# Patient Record
Sex: Male | Born: 1940 | Race: White | Hispanic: No | Marital: Married | Smoking: Never smoker
Health system: Southern US, Community
[De-identification: ages and names within clinical notes are randomized; demographics above are authoritative.]

## PROBLEM LIST (undated history)

## (undated) DIAGNOSIS — N200 Calculus of kidney: Secondary | ICD-10-CM

## (undated) DIAGNOSIS — I6529 Occlusion and stenosis of unspecified carotid artery: Secondary | ICD-10-CM

## (undated) DIAGNOSIS — R4189 Other symptoms and signs involving cognitive functions and awareness: Secondary | ICD-10-CM

## (undated) DIAGNOSIS — M1A00X0 Idiopathic chronic gout, unspecified site, without tophus (tophi): Secondary | ICD-10-CM

## (undated) DIAGNOSIS — E7800 Pure hypercholesterolemia, unspecified: Secondary | ICD-10-CM

## (undated) DIAGNOSIS — K602 Anal fissure, unspecified: Secondary | ICD-10-CM

## (undated) DIAGNOSIS — I219 Acute myocardial infarction, unspecified: Secondary | ICD-10-CM

## (undated) DIAGNOSIS — R609 Edema, unspecified: Secondary | ICD-10-CM

## (undated) DIAGNOSIS — E538 Deficiency of other specified B group vitamins: Secondary | ICD-10-CM

## (undated) DIAGNOSIS — N400 Enlarged prostate without lower urinary tract symptoms: Secondary | ICD-10-CM

## (undated) DIAGNOSIS — I25119 Atherosclerotic heart disease of native coronary artery with unspecified angina pectoris: Principal | ICD-10-CM

## (undated) DIAGNOSIS — R5383 Other fatigue: Secondary | ICD-10-CM

## (undated) DIAGNOSIS — Z87442 Personal history of urinary calculi: Secondary | ICD-10-CM

## (undated) DIAGNOSIS — R7303 Prediabetes: Secondary | ICD-10-CM

## (undated) DIAGNOSIS — R5381 Other malaise: Secondary | ICD-10-CM

## (undated) DIAGNOSIS — E669 Obesity, unspecified: Secondary | ICD-10-CM

## (undated) DIAGNOSIS — N183 Chronic kidney disease, stage 3 (moderate): Secondary | ICD-10-CM

## (undated) DIAGNOSIS — F330 Major depressive disorder, recurrent, mild: Secondary | ICD-10-CM

## (undated) DIAGNOSIS — Z125 Encounter for screening for malignant neoplasm of prostate: Secondary | ICD-10-CM

## (undated) DIAGNOSIS — E785 Hyperlipidemia, unspecified: Secondary | ICD-10-CM

## (undated) DIAGNOSIS — H409 Unspecified glaucoma: Secondary | ICD-10-CM

## (undated) DIAGNOSIS — F419 Anxiety disorder, unspecified: Secondary | ICD-10-CM

## (undated) DIAGNOSIS — I10 Essential (primary) hypertension: Secondary | ICD-10-CM

## (undated) DIAGNOSIS — E213 Hyperparathyroidism, unspecified: Secondary | ICD-10-CM

## (undated) DIAGNOSIS — K635 Polyp of colon: Secondary | ICD-10-CM

## (undated) DIAGNOSIS — Z79899 Other long term (current) drug therapy: Secondary | ICD-10-CM

## (undated) DIAGNOSIS — M1990 Unspecified osteoarthritis, unspecified site: Secondary | ICD-10-CM

## (undated) HISTORY — DX: Essential (primary) hypertension: I10

## (undated) HISTORY — DX: Calculus of kidney: N20.0

## (undated) HISTORY — DX: Unspecified glaucoma: H40.9

## (undated) HISTORY — PX: CATARACT EXTRACTION, BILATERAL: SHX1313

## (undated) HISTORY — PX: ANAL FISTULOTOMY: SHX6423

## (undated) HISTORY — DX: Pure hypercholesterolemia, unspecified: E78.00

## (undated) HISTORY — DX: Hyperlipidemia, unspecified: E78.5

## (undated) HISTORY — DX: Idiopathic chronic gout, unspecified site, without tophus (tophi): M1A.00X0

## (undated) HISTORY — DX: Anal fissure, unspecified: K60.2

## (undated) HISTORY — DX: Occlusion and stenosis of unspecified carotid artery: I65.29

## (undated) HISTORY — DX: Other malaise: R53.81

## (undated) HISTORY — DX: Other symptoms and signs involving cognitive functions and awareness: R41.89

## (undated) HISTORY — DX: Major depressive disorder, recurrent, mild: F33.0

## (undated) HISTORY — DX: Encounter for screening for malignant neoplasm of prostate: Z12.5

## (undated) HISTORY — DX: Benign prostatic hyperplasia without lower urinary tract symptoms: N40.0

## (undated) HISTORY — DX: Anxiety disorder, unspecified: F41.9

## (undated) HISTORY — DX: Polyp of colon: K63.5

## (undated) HISTORY — DX: Other fatigue: R53.83

## (undated) HISTORY — DX: Atherosclerotic heart disease of native coronary artery with unspecified angina pectoris: I25.119

## (undated) HISTORY — DX: Prediabetes: R73.03

## (undated) HISTORY — DX: Hyperparathyroidism, unspecified: E21.3

## (undated) HISTORY — DX: Other long term (current) drug therapy: Z79.899

## (undated) HISTORY — DX: Chronic kidney disease, stage 3 (moderate): N18.3

## (undated) HISTORY — PX: HEMORRHOID SURGERY: SHX153

## (undated) HISTORY — PX: CORONARY ANGIOPLASTY: SHX604

## (undated) HISTORY — DX: Deficiency of other specified B group vitamins: E53.8

## (undated) HISTORY — PX: HEMORROIDECTOMY: SUR656

## (undated) HISTORY — DX: Edema, unspecified: R60.9

## (undated) HISTORY — PX: CARDIAC CATHETERIZATION: SHX172

## (undated) HISTORY — PX: TONSILLECTOMY: SUR1361

## (undated) HISTORY — DX: Unspecified osteoarthritis, unspecified site: M19.90

## (undated) HISTORY — DX: Obesity, unspecified: E66.9

---

## 2000-11-02 ENCOUNTER — Inpatient Hospital Stay (HOSPITAL_COMMUNITY): Admission: EM | Admit: 2000-11-02 | Discharge: 2000-11-03 | Payer: Self-pay | Admitting: Emergency Medicine

## 2000-11-02 ENCOUNTER — Encounter: Payer: Self-pay | Admitting: Emergency Medicine

## 2000-11-03 ENCOUNTER — Encounter: Payer: Self-pay | Admitting: Cardiology

## 2001-01-05 ENCOUNTER — Ambulatory Visit (HOSPITAL_COMMUNITY): Admission: RE | Admit: 2001-01-05 | Discharge: 2001-01-05 | Payer: Self-pay | Admitting: Cardiology

## 2004-11-05 ENCOUNTER — Ambulatory Visit (HOSPITAL_COMMUNITY): Admission: RE | Admit: 2004-11-05 | Discharge: 2004-11-05 | Payer: Self-pay | Admitting: Urology

## 2004-12-13 ENCOUNTER — Ambulatory Visit (HOSPITAL_COMMUNITY): Admission: RE | Admit: 2004-12-13 | Discharge: 2004-12-13 | Payer: Self-pay | Admitting: Urology

## 2005-03-21 ENCOUNTER — Ambulatory Visit (HOSPITAL_BASED_OUTPATIENT_CLINIC_OR_DEPARTMENT_OTHER): Admission: RE | Admit: 2005-03-21 | Discharge: 2005-03-21 | Payer: Self-pay | Admitting: Urology

## 2012-06-08 HISTORY — PX: COLONOSCOPY: SHX5424

## 2015-04-28 DIAGNOSIS — N529 Male erectile dysfunction, unspecified: Secondary | ICD-10-CM

## 2015-04-28 DIAGNOSIS — E213 Hyperparathyroidism, unspecified: Secondary | ICD-10-CM | POA: Insufficient documentation

## 2015-04-28 DIAGNOSIS — Z79899 Other long term (current) drug therapy: Secondary | ICD-10-CM | POA: Insufficient documentation

## 2015-04-28 DIAGNOSIS — R609 Edema, unspecified: Secondary | ICD-10-CM

## 2015-04-28 DIAGNOSIS — N400 Enlarged prostate without lower urinary tract symptoms: Secondary | ICD-10-CM

## 2015-04-28 DIAGNOSIS — E785 Hyperlipidemia, unspecified: Secondary | ICD-10-CM

## 2015-04-28 DIAGNOSIS — K635 Polyp of colon: Secondary | ICD-10-CM

## 2015-04-28 DIAGNOSIS — N183 Chronic kidney disease, stage 3 unspecified: Secondary | ICD-10-CM | POA: Insufficient documentation

## 2015-04-28 DIAGNOSIS — I10 Essential (primary) hypertension: Secondary | ICD-10-CM | POA: Insufficient documentation

## 2015-04-28 DIAGNOSIS — E669 Obesity, unspecified: Secondary | ICD-10-CM | POA: Insufficient documentation

## 2015-04-28 DIAGNOSIS — M1990 Unspecified osteoarthritis, unspecified site: Secondary | ICD-10-CM | POA: Insufficient documentation

## 2015-04-28 DIAGNOSIS — N1831 Chronic kidney disease, stage 3a: Secondary | ICD-10-CM

## 2015-04-28 DIAGNOSIS — M1A00X0 Idiopathic chronic gout, unspecified site, without tophus (tophi): Secondary | ICD-10-CM | POA: Insufficient documentation

## 2015-04-28 DIAGNOSIS — E538 Deficiency of other specified B group vitamins: Secondary | ICD-10-CM | POA: Insufficient documentation

## 2015-04-28 HISTORY — DX: Unspecified osteoarthritis, unspecified site: M19.90

## 2015-04-28 HISTORY — DX: Deficiency of other specified B group vitamins: E53.8

## 2015-04-28 HISTORY — DX: Hyperparathyroidism, unspecified: E21.3

## 2015-04-28 HISTORY — DX: Edema, unspecified: R60.9

## 2015-04-28 HISTORY — DX: Idiopathic chronic gout, unspecified site, without tophus (tophi): M1A.00X0

## 2015-04-28 HISTORY — DX: Polyp of colon: K63.5

## 2015-04-28 HISTORY — DX: Chronic kidney disease, stage 3a: N18.31

## 2015-04-28 HISTORY — DX: Chronic kidney disease, stage 3 unspecified: N18.30

## 2015-04-28 HISTORY — DX: Obesity, unspecified: E66.9

## 2015-04-28 HISTORY — DX: Hyperlipidemia, unspecified: E78.5

## 2015-04-28 HISTORY — DX: Benign prostatic hyperplasia without lower urinary tract symptoms: N40.0

## 2015-04-28 HISTORY — DX: Male erectile dysfunction, unspecified: N52.9

## 2015-04-28 HISTORY — DX: Other long term (current) drug therapy: Z79.899

## 2015-04-28 HISTORY — DX: Essential (primary) hypertension: I10

## 2016-04-29 DIAGNOSIS — F419 Anxiety disorder, unspecified: Secondary | ICD-10-CM | POA: Insufficient documentation

## 2016-04-29 HISTORY — DX: Anxiety disorder, unspecified: F41.9

## 2016-05-06 DIAGNOSIS — I25119 Atherosclerotic heart disease of native coronary artery with unspecified angina pectoris: Secondary | ICD-10-CM | POA: Insufficient documentation

## 2016-05-06 HISTORY — DX: Atherosclerotic heart disease of native coronary artery with unspecified angina pectoris: I25.119

## 2016-05-16 DIAGNOSIS — F330 Major depressive disorder, recurrent, mild: Secondary | ICD-10-CM | POA: Insufficient documentation

## 2016-05-16 HISTORY — DX: Major depressive disorder, recurrent, mild: F33.0

## 2016-06-04 DIAGNOSIS — R5383 Other fatigue: Secondary | ICD-10-CM

## 2016-06-04 DIAGNOSIS — R5381 Other malaise: Secondary | ICD-10-CM

## 2016-06-04 HISTORY — DX: Other malaise: R53.81

## 2016-06-05 DIAGNOSIS — R4189 Other symptoms and signs involving cognitive functions and awareness: Secondary | ICD-10-CM | POA: Insufficient documentation

## 2016-06-05 HISTORY — DX: Other symptoms and signs involving cognitive functions and awareness: R41.89

## 2017-03-07 DIAGNOSIS — R7303 Prediabetes: Secondary | ICD-10-CM

## 2017-03-07 HISTORY — DX: Prediabetes: R73.03

## 2017-09-08 DIAGNOSIS — Z125 Encounter for screening for malignant neoplasm of prostate: Secondary | ICD-10-CM

## 2017-09-08 HISTORY — DX: Encounter for screening for malignant neoplasm of prostate: Z12.5

## 2017-12-08 NOTE — Progress Notes (Signed)
Cardiology Office Note:    Date:  12/09/2017   ID:  Matthew Mcneil, DOB Dec 28, 1940, MRN [id number]  PCP:  Gordan Payment., MD  Cardiologist:  Norman Herrlich, MD    Referring MD: Gordan Payment., MD    ASSESSMENT:    1. Coronary artery disease involving native coronary artery of native heart with angina pectoris (HCC)   2. Essential hypertension   3. Hyperlipidemia, unspecified hyperlipidemia type   4. Chronic kidney disease, stage III (moderate) (HCC)    PLAN:    In order of problems listed above:  1. His coronary artery disease is stable chronic no anginal discomfort he reduce aspirin 81 mg daily continue beta-blocker calcium channel blocker for hypertension relief of angina and initiate PCSK9 therapy at this time I would not advise an ischemia evaluation 2. Stable continue treatment diuretic beta-blocker dosing channel blocker 3. Poorly controlled initiate PCSK9 4. 1 month we will check a lipid profile and CMP including renal function   Next appointment: 6 months   Medication Adjustments/Labs and Tests Ordered: Current medicines are reviewed at length with the patient today.  Concerns regarding medicines are outlined above.  No orders of the defined types were placed in this encounter.  No orders of the defined types were placed in this encounter.     Chief Complaint  Patient presents with  . Follow-up    for CAD    History of Present Illness:    Matthew Mcneil is a 77 y.o. male with a hx of CAD PCI in 1989 subsequent heart catheterization 2002 with chronic total occlusion treated medically other problems include hypertension hyperlipidemia, stage III CKD and bradycardia and a carotid bruit with right coronary artery stenosis.  This last seen by me 06/10/2016 at [facility] cardiology.. 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  He is very active has had no angina dyspnea  palpitation or syncope.  Said no episodes requiring nitroglycerin.  He is statin intolerant with muscle weakness and will be initiated on PCSK9 with familial hyperlipidemia LDL greater than 220.  He has known carotid disease and will have a repeat cerebrovascular duplex although he is not having TIA Past Medical History:  Diagnosis Date  . Anxiety 04/29/2016  . Benign prostate hyperplasia 04/28/2015  . Carotid artery occlusion   . 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 Chronic kidney disease, stage III (moderate) (HCC) 04/28/2015  . Colon polyp 04/28/2015  . Coronary artery disease involving native coronary artery of native heart with angina pectoris (HCC) 05/06/2016  . Edema 04/28/2015  . Essential hypertension 04/28/2015  . High risk medication use 04/28/2015  . Hyperlipidemia 04/28/2015  . Hyperparathyroidism (HCC) 04/28/2015  . Impaired cognition 06/05/2016  . Malaise and fatigue 06/04/2016  . 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 Obesity 04/28/2015  . Osteoarthritis 04/28/2015  . Prediabetes 03/07/2017  . Screening for prostate cancer 09/08/2017   Chooses no further testing  . Vitamin B12 deficiency 04/28/2015    Past Surgical History:  Procedure Laterality Date  . CARDIAC CATHETERIZATION    . CORONARY ANGIOPLASTY    . HEMORROIDECTOMY      Current Medications: Current Meds  Medication Sig  . allopurinol (ZYLOPRIM) 100 MG tablet Take 1 tablet by mouth 2 (two) times daily.  Marland Kitchen amLODipine (NORVASC) 5 MG tablet Take 1 tablet by mouth 2 (two) times daily.  Marland Kitchen aspirin 325 MG EC tablet  Take 325 mg by mouth daily.  . carvedilol (COREG) 3.125 MG tablet Take 3.125 mg by mouth daily.  . dorzolamidel-timolol (COSOPT) 22.3-6.8 MG/ML SOLN ophthalmic solution 1 drop 2 (two) times daily.  Marland Kitchen escitalopram (LEXAPRO) 10 MG tablet Take 10 mg by mouth daily.  . finasteride (PROSCAR) 5 MG tablet Take 5 mg by mouth daily.  . furosemide (LASIX) 40 MG tablet Take 40 mg by mouth  daily.  Marland Kitchen latanoprost (XALATAN) 0.005 % ophthalmic solution Place 1 drop into both eyes at bedtime.  . Omega-3 Fatty Acids (FISH OIL) 1000 MG CAPS Take 1 capsule by mouth 2 (two) times daily.  . vitamin B-12 (CYANOCOBALAMIN) 500 MCG tablet Take 500 mcg by mouth every other day.     Allergies:   Statins   Social History   Socioeconomic History  . Marital status: Married    Spouse name: Not on file  . Number of children: Not on file  . Years of education: Not on file  . Highest education level: Not on file  Occupational History  . Not on file  Social Needs  . Financial resource strain: Not on file  . Food insecurity:    Worry: Not on file    Inability: Not on file  . Transportation needs:    Medical: Not on file    Non-medical: Not on file  Tobacco Use  . Smoking status: Former Games developer  . Smokeless tobacco: Never Used  Substance and Sexual Activity  . Alcohol use: Not Currently    Frequency: Never  . Drug use: Not Currently  . Sexual activity: Not on file  Lifestyle  . Physical activity:    Days per week: Not on file    Minutes per session: Not on file  . Stress: Not on file  Relationships  . Social connections:    Talks on phone: Not on file    Gets together: Not on file    Attends religious service: Not on file    Active member of club or organization: Not on file    Attends meetings of clubs or organizations: Not on file    Relationship status: Not on file  Other Topics Concern  . Not on file  Social History Narrative  . Not on file     Family History: The patient's family history includes Cancer in his mother; Diabetes in his mother; Heart attack in his mother; Hypertension in his father and mother; Stroke in his brother. ROS:   Please see the history of present illness.    All other systems reviewed and are negative.  EKGs/Labs/Other Studies Reviewed:    The following studies were reviewed today:  EKG:  EKG ordered today.  The ekg ordered today  demonstrates sinus rhythm normal  Recent Labs:   09/08/2017 CMP is normal except for glucose of 106 and GFR 61 cc/min.  At that time cholesterol was 274, LDL direct 224, HDL 39 triglycerides 210.  His non-HDL cholesterol severely elevated 235.  CBC is normal No results found for requested labs within last 8760 hours.  Recent Lipid Panel No results found for: CHOL, TRIG, HDL, CHOLHDL, VLDL, LDLCALC, LDLDIRECT  Physical Exam:    VS:  BP 140/60   Pulse (!) 56   SpO2 96%     Wt Readings from Last 3 Encounters:  No data found for Wt     GEN:  Well nourished, well developed in no acute distress HEENT: Normal NECK: No JVD; No carotid bruits LYMPHATICS: No lymphadenopathy  CARDIAC: RRR, no murmurs, rubs, gallops RESPIRATORY:  Clear to auscultation without rales, wheezing or rhonchi  ABDOMEN: Soft, non-tender, non-distended MUSCULOSKELETAL:  No edema; No deformity  SKIN: Warm and dry NEUROLOGIC:  Alert and oriented x 3 PSYCHIATRIC:  Normal affect    Signed, Norman Herrlich, MD  12/09/2017 2:40 PM    [facility]

## 2017-12-09 ENCOUNTER — Ambulatory Visit (INDEPENDENT_AMBULATORY_CARE_PROVIDER_SITE_OTHER): Payer: Medicare HMO | Admitting: Cardiology

## 2017-12-09 VITALS — BP 140/60 | HR 56

## 2017-12-09 DIAGNOSIS — E7800 Pure hypercholesterolemia, unspecified: Secondary | ICD-10-CM | POA: Diagnosis not present

## 2017-12-09 DIAGNOSIS — N183 Chronic kidney disease, stage 3 unspecified: Secondary | ICD-10-CM

## 2017-12-09 DIAGNOSIS — I25119 Atherosclerotic heart disease of native coronary artery with unspecified angina pectoris: Secondary | ICD-10-CM | POA: Diagnosis not present

## 2017-12-09 DIAGNOSIS — I10 Essential (primary) hypertension: Secondary | ICD-10-CM | POA: Diagnosis not present

## 2017-12-09 MED ORDER — NITROGLYCERIN 0.4 MG SL SUBL: 0.4000 mg | SUBLINGUAL_TABLET | SUBLINGUAL | 11 refills | Status: DC | PRN

## 2017-12-09 MED ORDER — ASPIRIN EC 81 MG PO TBEC: 81.0000 mg | DELAYED_RELEASE_TABLET | Freq: Every day | ORAL | 3 refills | Status: AC

## 2017-12-09 MED ORDER — EVOLOCUMAB 140 MG/ML ~~LOC~~ SOAJ: 140.0000 mg | SUBCUTANEOUS | 0 refills | Status: DC

## 2017-12-09 NOTE — Patient Instructions (Addendum)
Medication Instructions:  Your physician has recommended you make the following change in your medication:   Decrease: aspirin to 81 mg daily.   Start: Repatha 140 mg injection every 14 days   Start as needed for chest pain: Nitroglycerin 0.4 mg sublingual (under your tongue) as needed for chest pain. If experiencing chest pain, stop what you are doing and sit down. Take 1 nitroglycerin and wait 5 minutes. If chest pain continues, take another nitroglycerin and wait 5 minutes. If chest pain does not subside, take 1 more nitroglycerin and dial 911. You make take a total of 3 nitroglycerin in a 15 minute time frame.   Labwork: Your physician recommends that you return for lab work in 1 month: Lipids and cmp  Testing/Procedures: Your physician has requested that you have a carotid duplex. This test is an ultrasound of the carotid arteries in your neck. It looks at blood flow through these arteries that supply the brain with blood. Allow one hour for this exam. There are no restrictions or special instructions.    Follow-Up: Your physician wants you to follow-up in: 6 months. You will receive a reminder letter in the mail two months in advance. If you don't receive a letter, please call our office to schedule the follow-up appointment.   Any Other Special Instructions Will Be Listed Below (If Applicable).     If you need a refill on your cardiac medications before your next appointment, please call your pharmacy.  Nitroglycerin sublingual tablets What is this medicine? NITROGLYCERIN (nye troe GLI ser in) is a type of vasodilator. It relaxes blood vessels, increasing the blood and oxygen supply to your heart. This medicine is used to relieve chest pain caused by angina. It is also used to prevent chest pain before activities like climbing stairs, going outdoors in cold weather, or sexual activity. This medicine may be used for other purposes; ask your health care provider or pharmacist if  you have questions. COMMON BRAND NAME(S): Nitroquick, Nitrostat, Nitrotab What should I tell my health care provider before I take this medicine? They need to know if you have any of these conditions: -anemia -head injury, recent stroke, or bleeding in the brain -liver disease -previous heart attack -an unusual or allergic reaction to nitroglycerin, other medicines, foods, dyes, or preservatives -pregnant or trying to get pregnant -breast-feeding How should I use this medicine? Take this medicine by mouth as needed. At the first sign of an angina attack (chest pain or tightness) place one tablet under your tongue. You can also take this medicine 5 to 10 minutes before an event likely to produce chest pain. Follow the directions on the prescription label. Let the tablet dissolve under the tongue. Do not swallow whole. Replace the dose if you accidentally swallow it. It will help if your mouth is not dry. Saliva around the tablet will help it to dissolve more quickly. Do not eat or drink, smoke or chew tobacco while a tablet is dissolving. If you are not better within 5 minutes after taking ONE dose of nitroglycerin, call 9-1-1 immediately to seek emergency medical care. Do not take more than 3 nitroglycerin tablets over 15 minutes. If you take this medicine often to relieve symptoms of angina, your doctor or health care professional may provide you with different instructions to manage your symptoms. If symptoms do not go away after following these instructions, it is important to call 9-1-1 immediately. Do not take more than 3 nitroglycerin tablets over 15 minutes. Talk  to your pediatrician regarding the use of this medicine in children. Special care may be needed. Overdosage: If you think you have taken too much of this medicine contact a poison control center or emergency room at once. NOTE: This medicine is only for you. Do not share this medicine with others. What if I miss a dose? This does  not apply. This medicine is only used as needed. What may interact with this medicine? Do not take this medicine with any of the following medications: -certain migraine medicines like ergotamine and dihydroergotamine (DHE) -medicines used to treat erectile dysfunction like sildenafil, tadalafil, and vardenafil -riociguat This medicine may also interact with the following medications: -alteplase -aspirin -heparin -medicines for high blood pressure -medicines for mental depression -other medicines used to treat angina -phenothiazines like chlorpromazine, mesoridazine, prochlorperazine, thioridazine This list may not describe all possible interactions. Give your health care provider a list of all the medicines, herbs, non-prescription drugs, or dietary supplements you use. Also tell them if you smoke, drink alcohol, or use illegal drugs. Some items may interact with your medicine. What should I watch for while using this medicine? Tell your doctor or health care professional if you feel your medicine is no longer working. Keep this medicine with you at all times. Sit or lie down when you take your medicine to prevent falling if you feel dizzy or faint after using it. Try to remain calm. This will help you to feel better faster. If you feel dizzy, take several deep breaths and lie down with your feet propped up, or bend forward with your head resting between your knees. You may get drowsy or dizzy. Do not drive, use machinery, or do anything that needs mental alertness until you know how this drug affects you. Do not stand or sit up quickly, especially if you are an older patient. This reduces the risk of dizzy or fainting spells. Alcohol can make you more drowsy and dizzy. Avoid alcoholic drinks. Do not treat yourself for coughs, colds, or pain while you are taking this medicine without asking your doctor or health care professional for advice. Some ingredients may increase your blood  pressure. What side effects may I notice from receiving this medicine? Side effects that you should report to your doctor or health care professional as soon as possible: -blurred vision -dry mouth -skin rash -sweating -the feeling of extreme pressure in the head -unusually weak or tired Side effects that usually do not require medical attention (report to your doctor or health care professional if they continue or are bothersome): -flushing of the face or neck -headache -irregular heartbeat, palpitations -nausea, vomiting This list may not describe all possible side effects. Call your doctor for medical advice about side effects. You may report side effects to FDA at 1-800-FDA-1088. Where should I keep my medicine? Keep out of the reach of children. Store at room temperature between 20 and 25 degrees C (68 and 77 degrees F). Store in Retail buyer. Protect from light and moisture. Keep tightly closed. Throw away any unused medicine after the expiration date. NOTE: This sheet is a summary. It may not cover all possible information. If you have questions about this medicine, talk to your doctor, pharmacist, or health care provider.  2018 Elsevier/Gold Standard (2012-12-24 17:57:36)  Evolocumab injection What is this medicine? EVOLOCUMAB (e voe LOK ue mab) is known as a PCSK9 inhibitor. It is used to lower the level of cholesterol in the blood. It may be used alone or  in combination with other cholesterol-lowering drugs. This drug may also be used to reduce the risk of heart attack, stroke, and certain types of heart surgery in patients with heart disease. This medicine may be used for other purposes; ask your health care provider or pharmacist if you have questions. COMMON BRAND NAME(S): REPATHA What should I tell my health care provider before I take this medicine? They need to know if you have any of these conditions: -an unusual or allergic reaction to evolocumab, other medicines,  foods, dyes, or preservatives -pregnant or trying to get pregnant -breast-feeding How should I use this medicine? This medicine is for injection under the skin. You will be taught how to prepare and give this medicine. Use exactly as directed. Take your medicine at regular intervals. Do not take your medicine more often than directed. It is important that you put your used needles and syringes in a special sharps container. Do not put them in a trash can. If you do not have a sharps container, call your pharmacist or health care provider to get one. Talk to your pediatrician regarding the use of this medicine in children. While this drug may be prescribed for children as young as 13 years for selected conditions, precautions do apply. Overdosage: If you think you have taken too much of this medicine contact a poison control center or emergency room at once. NOTE: This medicine is only for you. Do not share this medicine with others. What if I miss a dose? If you miss a dose, take it as soon as you can if there are more than 7 days until the next scheduled dose, or skip the missed dose and take the next dose according to your original schedule. Do not take double or extra doses. What may interact with this medicine? Interactions are not expected. This list may not describe all possible interactions. Give your health care provider a list of all the medicines, herbs, non-prescription drugs, or dietary supplements you use. Also tell them if you smoke, drink alcohol, or use illegal drugs. Some items may interact with your medicine. What should I watch for while using this medicine? You may need blood work while you are taking this medicine. What side effects may I notice from receiving this medicine? Side effects that you should report to your doctor or health care professional as soon as possible: -allergic reactions like skin rash, itching or hives, swelling of the face, lips, or tongue -signs and  symptoms of infection like fever or chills; cough; sore throat; pain or trouble passing urine Side effects that usually do not require medical attention (report to your doctor or health care professional if they continue or are bothersome): -diarrhea -nausea -muscle pain -pain, redness, or irritation at site where injected This list may not describe all possible side effects. Call your doctor for medical advice about side effects. You may report side effects to FDA at 1-800-FDA-1088. Where should I keep my medicine? Keep out of the reach of children. You will be instructed on how to store this medicine. Throw away any unused medicine after the expiration date on the label. NOTE: This sheet is a summary. It may not cover all possible information. If you have questions about this medicine, talk to your doctor, pharmacist, or health care provider.  2018 Elsevier/Gold Standard (2016-02-12 13:21:53)

## 2017-12-10 NOTE — Addendum Note (Signed)
Addended by: Crist Fat on: 12/10/2017 09:15 AM   Modules accepted: Orders

## 2017-12-22 ENCOUNTER — Other Ambulatory Visit: Payer: Self-pay | Admitting: *Deleted

## 2017-12-22 MED ORDER — EVOLOCUMAB 140 MG/ML ~~LOC~~ SOAJ: 140.0000 mg | SUBCUTANEOUS | 0 refills | Status: DC

## 2017-12-22 MED ORDER — NITROGLYCERIN 0.4 MG SL SUBL: 0.4000 mg | SUBLINGUAL_TABLET | SUBLINGUAL | 11 refills | Status: DC | PRN

## 2017-12-22 NOTE — Telephone Encounter (Signed)
Repatha and Nitro sent to wrong pharmacy, Sent in to right [facility].

## 2017-12-29 ENCOUNTER — Telehealth: Payer: Self-pay | Admitting: Cardiology

## 2017-12-29 NOTE — Telephone Encounter (Signed)
Pharmacy needs to check on PA for patients Repatha. Please call pharmacy at [phone]

## 2018-01-01 ENCOUNTER — Other Ambulatory Visit: Payer: Self-pay | Admitting: *Deleted

## 2018-01-01 DIAGNOSIS — E7800 Pure hypercholesterolemia, unspecified: Secondary | ICD-10-CM

## 2018-01-01 NOTE — Telephone Encounter (Signed)
Referral for lipid clinic has been placed. Prior authorization forms for repatha faxed to lipid clinic at the [facility] location for review and completion.

## 2018-01-05 ENCOUNTER — Telehealth: Payer: Self-pay | Admitting: *Deleted

## 2018-01-05 NOTE — Telephone Encounter (Signed)
Attempted to contact patient on cell phone with no answer. Left message to return call on home phone to discuss information requested on prior authorization for repatha. Need to know what statin drugs patient has tried in the past and the time frame for each before proceeding.

## 2018-01-07 NOTE — Telephone Encounter (Signed)
Spoke with patient and patient's wife, Dennie Bible, regarding statin drugs patient has tried to take. They cannot recall medication names or when he took them but states Dr. Shary Decamp managed these medications. Left a message for Lupita Leash, RN at Dr. Anders Grant office to see if she can provide that information so PA can be sent.

## 2018-01-22 ENCOUNTER — Ambulatory Visit (INDEPENDENT_AMBULATORY_CARE_PROVIDER_SITE_OTHER): Payer: Medicare HMO

## 2018-01-22 DIAGNOSIS — I25119 Atherosclerotic heart disease of native coronary artery with unspecified angina pectoris: Secondary | ICD-10-CM | POA: Diagnosis not present

## 2018-01-22 NOTE — Progress Notes (Signed)
Complete carotid artery duplex exam has been performed. Bilateral ICA stenosis was observed, right greater than left . Calcific plaque was observed bilaterally.   Jimmy Oza Oberle RDCS, RVT

## 2018-01-23 ENCOUNTER — Telehealth: Payer: Self-pay

## 2018-01-23 DIAGNOSIS — R9389 Abnormal findings on diagnostic imaging of other specified body structures: Secondary | ICD-10-CM | POA: Insufficient documentation

## 2018-01-23 HISTORY — DX: Abnormal findings on diagnostic imaging of other specified body structures: R93.89

## 2018-01-23 NOTE — Telephone Encounter (Signed)
Patient aware of carotid ultrasound results with right carotid artery stenosis.  Patient advised that he will need CTA neck per Dr Dulce SellarMunley.   Patient advised that once prior authorization is obtained he will be scheduled.  Patient agreed to plan and verbalized understanding.

## 2018-01-27 LAB — LIPID PANEL
Chol/HDL Ratio: 6.2 ratio — ABNORMAL HIGH (ref 0.0–5.0)
Cholesterol, Total: 222 mg/dL — ABNORMAL HIGH (ref 100–199)
HDL: 36 mg/dL — ABNORMAL LOW (ref >39)
LDL CALC: 118 mg/dL — AB (ref 0–99)
Triglycerides: 340 mg/dL — ABNORMAL HIGH (ref 0–149)
VLDL CHOLESTEROL CAL: 68 mg/dL — AB (ref 5–40)

## 2018-01-27 LAB — COMPREHENSIVE METABOLIC PANEL
A/G RATIO: 2.1 (ref 1.2–2.2)
ALT: 21 IU/L (ref 0–44)
AST: 21 IU/L (ref 0–40)
Albumin: 4.5 g/dL (ref 3.5–4.8)
Alkaline Phosphatase: 91 IU/L (ref 39–117)
BUN / CREAT RATIO: 17 (ref 10–24)
BUN: 22 mg/dL (ref 8–27)
Bilirubin Total: 0.3 mg/dL (ref 0.0–1.2)
CALCIUM: 10.5 mg/dL — AB (ref 8.6–10.2)
CO2: 25 mmol/L (ref 20–29)
CREATININE: 1.31 mg/dL — AB (ref 0.76–1.27)
Chloride: 105 mmol/L (ref 96–106)
GFR, EST AFRICAN AMERICAN: 60 mL/min/{1.73_m2} (ref >59)
GFR, EST NON AFRICAN AMERICAN: 52 mL/min/{1.73_m2} — AB (ref >59)
Globulin, Total: 2.1 g/dL (ref 1.5–4.5)
Glucose: 128 mg/dL — ABNORMAL HIGH (ref 65–99)
POTASSIUM: 4.3 mmol/L (ref 3.5–5.2)
Sodium: 147 mmol/L — ABNORMAL HIGH (ref 134–144)
TOTAL PROTEIN: 6.6 g/dL (ref 6.0–8.5)

## 2018-01-29 ENCOUNTER — Encounter: Payer: Self-pay | Admitting: *Deleted

## 2018-01-29 ENCOUNTER — Ambulatory Visit (HOSPITAL_BASED_OUTPATIENT_CLINIC_OR_DEPARTMENT_OTHER)
Admission: RE | Admit: 2018-01-29 | Discharge: 2018-01-29 | Disposition: A | Discharge status: Home / Self Care | Payer: Medicare HMO | Source: Ambulatory Visit | Attending: Cardiology | Admitting: Cardiology

## 2018-01-29 ENCOUNTER — Encounter (HOSPITAL_BASED_OUTPATIENT_CLINIC_OR_DEPARTMENT_OTHER): Payer: Self-pay

## 2018-01-29 DIAGNOSIS — R9389 Abnormal findings on diagnostic imaging of other specified body structures: Secondary | ICD-10-CM

## 2018-01-29 DIAGNOSIS — I6523 Occlusion and stenosis of bilateral carotid arteries: Secondary | ICD-10-CM | POA: Insufficient documentation

## 2018-01-29 MED ORDER — IOPAMIDOL (ISOVUE-370) INJECTION 76%
100.0000 mL | Freq: Once | INTRAVENOUS | Status: AC | PRN
  Administered 2018-01-29: 80 mL via INTRAVENOUS

## 2018-02-18 ENCOUNTER — Ambulatory Visit (INDEPENDENT_AMBULATORY_CARE_PROVIDER_SITE_OTHER): Payer: Medicare HMO | Admitting: Pharmacist Clinician (PhC)/ Clinical Pharmacy Specialist

## 2018-02-18 DIAGNOSIS — E7800 Pure hypercholesterolemia, unspecified: Secondary | ICD-10-CM

## 2018-02-18 MED ORDER — EVOLOCUMAB 140 MG/ML ~~LOC~~ SOAJ: 140.0000 mg | SUBCUTANEOUS | 12 refills | Status: DC

## 2018-02-18 NOTE — Patient Instructions (Signed)
We will [facility] the [facility] and send the prescription to your local pharmacy ([facility]).   Once we know what the copay is, we can submit the application to [employer].   Get your cholesterol labs checked after the 5th or 6th dose of Repatha.  We will send you a reminder letter in about 2 months.     Cholesterol Cholesterol is a fat. Your body needs a small amount of cholesterol. Cholesterol (plaque) may build up in your blood vessels (arteries). That makes you more likely to have a heart attack or stroke. You cannot feel your cholesterol level. Having a blood test is the only way to find out if your level is high. Keep your test results. Work with your doctor to keep your cholesterol at a good level. What do the results mean?  Total cholesterol is how much cholesterol is in your blood.  LDL is bad cholesterol. This is the type that can build up. Try to have low LDL.  HDL is good cholesterol. It cleans your blood vessels and carries LDL away. Try to have high HDL.  Triglycerides are fat that the body can store or burn for energy. What are good levels of cholesterol?  Total cholesterol below 200.  LDL below 100 is good for people who have health risks. LDL below 70 is good for people who have very high risks.  HDL above 40 is good. It is best to have HDL of 60 or higher.  Triglycerides below 150. How can I lower my cholesterol? Diet Follow your diet program as told by your doctor.  Choose fish, white meat chicken, or [place] that is roasted or baked. Try not to eat red meat, fried foods, sausage, or lunch meats.  Eat lots of fresh fruits and vegetables.  Choose whole grains, beans, pasta, potatoes, and cereals.  Choose olive oil, corn oil, or canola oil. Only use small amounts.  Try not to eat butter, mayonnaise, shortening, or palm kernel oils.  Try not to eat foods with trans fats.  Choose low-fat or nonfat dairy foods. ? Drink skim or  nonfat milk. ? Eat low-fat or nonfat yogurt and cheeses. ? Try not to drink whole milk or cream. ? Try not to eat ice cream, egg yolks, or full-fat cheeses.  Healthy desserts include angel food cake, ginger snaps, animal crackers, hard candy, popsicles, and low-fat or nonfat frozen yogurt. Try not to eat pastries, cakes, pies, and cookies.  Exercise Follow your exercise program as told by your doctor.  Be more active. Try gardening, walking, and taking the stairs.  Ask your doctor about ways that you can be more active.  Medicine  Take over-the-counter and prescription medicines only as told by your doctor. This information is not intended to replace advice given to you by your health care provider. Make sure you discuss any questions you have with your health care provider. Document Released: 05/24/2008 Document Revised: 09/27/2015 Document Reviewed: 09/07/2015 Elsevier Interactive Patient Education  [employer]2018 Elsevier Inc.

## 2018-02-18 NOTE — Progress Notes (Signed)
02/18/2018 Matthew Mcneil 1940/06/12 [id number]   HPI:  Matthew Mcneil is a 77 y.o. male patient of Dr Dulce SellarMunley, who presents today for a lipid clinic evaluation.  In addition to mixed hyperlipidemia, his medical history is significant for CAD (s/p PCI in 1989 and repeat cath in 2002 showing total occlusion), CKD (stage 3) and bradycardia.    Current Medications:  None  Cholesterol Goals:  LDL < 70, TG < 150   Intolerant/previously tried:  Crestor, Lipitor and Zocor - myalgias, especially in arms  Family history:    Father died at 6672 - MI  Mother died at 2884 - heart disease, dementia  1 brother died at 4471 from MI  2 living, 1 with CABG, 1 healthy  3 children - unknown if hyperlipidemia  Diet:  Majority at home, some eating out ([place], [place], occasional burgers/fries).  Some fried foods, not excessive; eats all proteins including venison and rabbit; banana daily, fruit and vegetables; doesn't snack much, likes peanut butter crackers  Exercise:  Just yard work, no regular exercise, patient legally blind  Labs:   01/2018 - TC 222, TG 340, HDL 36, LDL 118  Current Outpatient Medications  Medication Sig Dispense Refill  . allopurinol (ZYLOPRIM) 100 MG tablet Take 1 tablet by mouth 2 (two) times daily.    Marland Kitchen. amLODipine (NORVASC) 5 MG tablet Take 1 tablet by mouth 2 (two) times daily.    Marland Kitchen. aspirin EC 81 MG tablet Take 1 tablet (81 mg total) by mouth daily. 90 tablet 3  . carvedilol (COREG) 3.125 MG tablet Take 3.125 mg by mouth daily.    . dorzolamidel-timolol (COSOPT) 22.3-6.8 MG/ML SOLN ophthalmic solution 1 drop 2 (two) times daily.    Marland Kitchen. escitalopram (LEXAPRO) 10 MG tablet Take 10 mg by mouth daily.    . Evolocumab (REPATHA SURECLICK) 140 MG/ML SOAJ Inject 140 mg into the skin every 14 (fourteen) days. 12 pen 0  . finasteride (PROSCAR) 5 MG tablet Take 5 mg by mouth daily.    . furosemide (LASIX) 40 MG tablet Take 40 mg by mouth daily.    Marland Kitchen. latanoprost (XALATAN) 0.005 %  ophthalmic solution Place 1 drop into both eyes at bedtime.    . nitroGLYCERIN (NITROSTAT) 0.4 MG SL tablet Place 1 tablet (0.4 mg total) under the tongue every 5 (five) minutes as needed for chest pain. 25 tablet 11  . Omega-3 Fatty Acids (FISH OIL) 1000 MG CAPS Take 1 capsule by mouth 2 (two) times daily.    . vitamin B-12 (CYANOCOBALAMIN) 500 MCG tablet Take 500 mcg by mouth every other day.     No current facility-administered medications for this visit.     Allergies  Allergen Reactions  . Statins Other (See Comments)    Unknown Myalgia     Past Medical History:  Diagnosis Date  . Anxiety 04/29/2016  . Benign prostate hyperplasia 04/28/2015  . Carotid artery occlusion   . 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 Chronic kidney disease, stage III (moderate) (HCC) 04/28/2015  . Colon polyp 04/28/2015  . Coronary artery disease involving native coronary artery of native heart with angina pectoris (HCC) 05/06/2016  . Edema 04/28/2015  . Essential hypertension 04/28/2015  . High risk medication use 04/28/2015  . Hyperlipidemia 04/28/2015  . Hyperparathyroidism (HCC) 04/28/2015  . Impaired cognition 06/05/2016  . Malaise and fatigue 06/04/2016  . 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 Obesity 04/28/2015  . Osteoarthritis 04/28/2015  . Prediabetes 03/07/2017  . Screening  for prostate cancer 09/08/2017   Chooses no further testing  . Vitamin B12 deficiency 04/28/2015    There were no vitals taken for this visit.   No problem-specific Assessment & Plan notes found for this encounter.   Phillips Hay PharmD CPP [facility]

## 2018-02-18 NOTE — Assessment & Plan Note (Signed)
Patient with mixed hyperlipidemia, intolerant to statin drugs in the past.  Dr. Hulen ShoutsMunley's RN has apparently already received authorization for Repatha 140 mg dose.  Will call [employer] to get a copy of the approval letter.  Because of high costs, patient is filling out Safety Net application and we will see if he can get it covered by them.  Repeat labs after 5-6 doses of Repatha

## 2018-02-19 ENCOUNTER — Other Ambulatory Visit: Payer: Self-pay | Admitting: *Deleted

## 2018-02-25 ENCOUNTER — Telehealth: Payer: Self-pay | Admitting: *Deleted

## 2018-02-25 NOTE — Telephone Encounter (Signed)
   [facility] Pre-operative Risk Assessment    Request for surgical clearance:  1. What type of surgery is being performed? CATARACT EXTRACTION W/INTROCULAR LENS IMPLANT OF THE RIGHT EYE; FOLLOWED BY THE LEFT EYE TO BE DONE ON 04/27/18   2. When is this surgery scheduled? 04/13/18   3. What type of clearance is required (medical clearance vs. Pharmacy clearance to hold med vs. Both)? MEDICAL   4. Are there any medications that need to be held prior to surgery and how long? PT IS ON ASA ; PER DR. Christia Reading BEVIS PT DOES NOT NEED TO HOLD ANY MEDICATIONS  5. Practice name and name of physician performing surgery? [facility]   6. What is your office phone number [phone]    7.   What is your office fax number             [phone]  8.   Anesthesia type (None, local, MAC, general) ? TOPICAL   Julaine Hua 02/25/2018, 11:02 AM  _________________________________________________________________   (provider comments below)

## 2018-02-27 NOTE — Telephone Encounter (Signed)
   Primary Cardiologist: Norman HerrlichBrian Munley, MD  Chart reviewed as part of pre-operative protocol coverage. Cataract extractions are recognized in guidelines as low risk surgeries that do not typically require specific preoperative testing or holding of blood thinner therapy. Therefore, given past medical history and time since last visit, based on ACC/AHA guidelines, Matthew Mcneil would be at acceptable risk for the planned procedure without further cardiovascular testing.   I will route this recommendation to the requesting party via Epic fax function and remove from pre-op pool.  Please call with questions.  Robbie LisBrittainy Shreshta Medley, PA-C 02/27/2018, 11:05 AM

## 2018-06-04 ENCOUNTER — Telehealth: Payer: Self-pay | Admitting: *Deleted

## 2018-06-04 NOTE — Telephone Encounter (Signed)
      Cardiac Questionnaire:       1. Have you been having new or worsening chest pain? No   2. Have you been having new or worsening shortness of breath? No 3. Have you been having new or worsening leg swelling, wt gain, or increase in abdominal girth (pants fitting more tightly)? No   4. Have you had any passing out spells? No  Per Dr. Dulce Sellar, patient's appointment should be rescheduled for June 2020 since he denies any cardiac symptoms at this time. Patient is agreeable and appointment has been rescheduled. Patient informed to contact our office with any questions or concerns. Patient verbalized understanding.                    Marland Kitchen

## 2018-06-08 ENCOUNTER — Ambulatory Visit: Payer: Medicare HMO | Admitting: Cardiology

## 2018-08-31 NOTE — Progress Notes (Signed)
Virtual Visit via Telephone Note   This visit type was conducted due to national recommendations for restrictions regarding the COVID-19 Pandemic (e.g. social distancing) in an effort to limit this patient's exposure and mitigate transmission in our community.  Due to his co-morbid illnesses, this patient is at least at moderate risk for complications without adequate follow up.  This format is felt to be most appropriate for this patient at this time.  The patient did not have access to video technology/had technical difficulties with video requiring transitioning to audio format only (telephone).  All issues noted in this document were discussed and addressed.  No physical exam could be performed with this format.  Please refer to the patient's chart for his  consent to telehealth for [facility].   Date:  09/01/2018   ID:  Matthew Mcneil Beougher, DOB 1940/09/10, MRN [id number]  Patient Location: Home Provider Location: Office  PCP:  Gordan PaymentGrisso, Greg A., MD  Cardiologist:  Norman HerrlichBrian Keldrick Pomplun, MD  Electrophysiologist:  None   Evaluation Performed:  Follow-Up Visit  Chief Complaint:  CAD  History of Present Illness:    Matthew Mcneil Paolo is a 78 y.o. male with  a hx of CAD PCI in 1989 subsequent heart catheterization 2002 with chronic total occlusion treated medically other problems include hypertension hyperlipidemia, stage III CKD and bradycardia and a carotid bruit with right coronary artery stenosis last seen 12/09/17.  The patient does not have symptoms concerning for COVID-19 infection (fever, chills, cough, or new shortness of breath).   Overall he feels good and is pleased with the quality of his life he is really isolating himself and wears a mask and practices good hand hygiene.  He sporadically checks his blood pressure and gets numbers of 1 40-1 50 systolic.  Prior to altering his medical therapy I asked him to check his blood pressure twice a week sitting at rest 5 minutes in the  afternoon record and bring him when I see him in the office face-to-face in 3 months and make a decision if he requires another antihypertensive agent.  He has had no edema shortness of breath chest pain palpitation or syncope and tolerates his medications including PCSK9 therapy without side effect.  He is overdue for labs and I will bring him to the office for CMP and lipid profile.  With bradycardia we will repeat an EKG next visit he will continue his minimal dose of beta-blocker Past Medical History:  Diagnosis Date  . Anxiety 04/29/2016  . Benign prostate hyperplasia 04/28/2015  . Carotid artery occlusion   . 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 Chronic kidney disease, stage III (moderate) (HCC) 04/28/2015  . Colon polyp 04/28/2015  . Coronary artery disease involving native coronary artery of native heart with angina pectoris (HCC) 05/06/2016  . Edema 04/28/2015  . Essential hypertension 04/28/2015  . High risk medication use 04/28/2015  . Hyperlipidemia 04/28/2015  . Hyperparathyroidism (HCC) 04/28/2015  . Impaired cognition 06/05/2016  . Malaise and fatigue 06/04/2016  . 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 Obesity 04/28/2015  . Osteoarthritis 04/28/2015  . Prediabetes 03/07/2017  . Screening for prostate cancer 09/08/2017   Chooses no further testing  . Vitamin B12 deficiency 04/28/2015   Past Surgical History:  Procedure Laterality Date  . CARDIAC CATHETERIZATION    . CATARACT EXTRACTION, BILATERAL    . CORONARY ANGIOPLASTY    . HEMORROIDECTOMY       Current Meds  Medication Sig  . allopurinol (ZYLOPRIM)  100 MG tablet Take 1 tablet by mouth 2 (two) times daily.  Marland Kitchen. amLODipine (NORVASC) 5 MG tablet Take 1 tablet by mouth 2 (two) times daily.  Marland Kitchen. aspirin EC 81 MG tablet Take 1 tablet (81 mg total) by mouth daily.  . carvedilol (COREG) 3.125 MG tablet Take 3.125 mg by mouth daily.  . dorzolamidel-timolol (COSOPT) 22.3-6.8 MG/ML SOLN ophthalmic solution 1  drop 2 (two) times daily.  Marland Kitchen. escitalopram (LEXAPRO) 10 MG tablet Take 10 mg by mouth daily.  . Evolocumab (REPATHA SURECLICK) 140 MG/ML SOAJ Inject 140 mg into the skin every 14 (fourteen) days.  . finasteride (PROSCAR) 5 MG tablet Take 5 mg by mouth daily.  . furosemide (LASIX) 40 MG tablet Take 40 mg by mouth daily.  Marland Kitchen. latanoprost (XALATAN) 0.005 % ophthalmic solution Place 1 drop into both eyes at bedtime.  . Multiple Vitamins-Minerals (PRESERVISION AREDS 2 PO) Take 1 tablet by mouth 2 (two) times daily.  . Omega-3 Fatty Acids (FISH OIL) 1000 MG CAPS Take 1 capsule by mouth 2 (two) times daily.  . vitamin B-12 (CYANOCOBALAMIN) 500 MCG tablet Take 500 mcg by mouth every other day.     Allergies:   Statins   Social History   Tobacco Use  . Smoking status: Never Smoker  . Smokeless tobacco: Never Used  Substance Use Topics  . Alcohol use: Not Currently    Frequency: Never  . Drug use: Not Currently     Family Hx: The patient's family history includes Cancer in his mother; Diabetes in his brother and mother; Heart attack in his brother, father, and mother; Hypertension in his father and mother.  ROS:   Please see the history of present illness.     All other systems reviewed and are negative.   Prior CV studies:   The following studies were reviewed today:  Carotid duplex 01/22/18; Summary: Right Carotid: Velocities in the right ICA are consistent with a 60-79%                stenosis. Non-hemodynamically significant plaque <50% noted in                the CCA. The distal ICA waveform is tardus                Consider CTA to define the severity of the RICA stenosis.  Left Carotid: Velocities in the left ICA are consistent with a 40-59% stenosis.               Non-hemodynamically significant plaque noted in the CCA.  Vertebrals:  Bilateral vertebral arteries demonstrate antegrade flow. Subclavians: Normal flow hemodynamics were seen in bilateral subclavian arteries.   Labs/Other Tests and Data Reviewed:     Recent Labs: 01/26/2018: ALT 21; BUN 22; Creatinine, Ser 1.31; Potassium 4.3; Sodium 147   Recent Lipid Panel Lab Results  Component Value Date/Time   CHOL 222 (H) 01/26/2018 01:53 PM   TRIG 340 (H) 01/26/2018 01:53 PM   HDL 36 (L) 01/26/2018 01:53 PM   CHOLHDL 6.2 (H) 01/26/2018 01:53 PM   LDLCALC 118 (H) 01/26/2018 01:53 PM    Wt Readings from Last 3 Encounters:  09/01/18 205 lb 9.6 oz (93.3 kg)     Objective:    Vital Signs:  BP (!) 150/78 (BP Location: Left Arm, Patient Position: Sitting)   Pulse 68   Ht 5\' 6"  (1.676 m)   Wt 205 lb 9.6 oz (93.3 kg)   BMI 33.18 kg/m  VITAL SIGNS:  reviewed His mood affect thought and cognition are normal alert and oriented in no respiratory distress or wheezing during conversation  ASSESSMENT & PLAN:    1. CAD stable New [place] Heart Association class I continue current medical therapy including aspirin PCSK9 treatment minimum beta-blocker and calcium channel blocker. 2. Hypertension, I am unsure if he is at target we need to have serial measurements at home and reassess in the office next visit for now continue his combined therapy including diuretic and nonrate limiting calcium channel blocker, if additional medicine is needed hydralazine would be appropriate 3. Hyperlipidemia stable check liver function lipid profile 4. CKD recheck labs 5. Carotid stenosis asymptomatic we will plan on doing a duplex of his carotids November  COVID-19 Education: The signs and symptoms of COVID-19 were discussed with the patient and how to seek care for testing (follow up with PCP or arrange E-visit).  The importance of social distancing was discussed today.  Time:   Today, I have spent 20 minutes with the patient with telehealth technology discussing the above problems.     Medication Adjustments/Labs and Tests Ordered: Current medicines are reviewed at length with the patient today.  Concerns regarding  medicines are outlined above.   Tests Ordered: No orders of the defined types were placed in this encounter.   Medication Changes: No orders of the defined types were placed in this encounter.   Follow Up:  In Person in 3 month(s)  Signed, Shirlee More, MD  09/01/2018 9:57 AM    [place]

## 2018-09-01 ENCOUNTER — Other Ambulatory Visit: Payer: Self-pay

## 2018-09-01 ENCOUNTER — Encounter: Payer: Self-pay | Admitting: Cardiology

## 2018-09-01 ENCOUNTER — Telehealth (INDEPENDENT_AMBULATORY_CARE_PROVIDER_SITE_OTHER): Payer: Medicare HMO | Admitting: Cardiology

## 2018-09-01 VITALS — BP 150/78 | HR 68 | Ht 66.0 in | Wt 205.6 lb

## 2018-09-01 DIAGNOSIS — E7800 Pure hypercholesterolemia, unspecified: Secondary | ICD-10-CM | POA: Diagnosis not present

## 2018-09-01 DIAGNOSIS — I25119 Atherosclerotic heart disease of native coronary artery with unspecified angina pectoris: Secondary | ICD-10-CM

## 2018-09-01 DIAGNOSIS — N183 Chronic kidney disease, stage 3 unspecified: Secondary | ICD-10-CM

## 2018-09-01 DIAGNOSIS — I10 Essential (primary) hypertension: Secondary | ICD-10-CM | POA: Diagnosis not present

## 2018-09-01 DIAGNOSIS — I6521 Occlusion and stenosis of right carotid artery: Secondary | ICD-10-CM

## 2018-09-01 NOTE — Addendum Note (Signed)
Addended by: Particia Nearing B on: 09/01/2018 10:33 AM   Modules accepted: Orders

## 2018-09-01 NOTE — Patient Instructions (Addendum)
Medication Instructions:  Your physician recommends that you continue on your current medications as directed. Please refer to the Current Medication list given to you today.  If you need a refill on your cardiac medications before your next appointment, please call your pharmacy.   Lab work: Your physician recommends that you return for lab work in: THIS WEEK CMP,LIPID  If you have labs (blood work) drawn today and your tests are completely normal, you will receive your results only by: Marland Kitchen. MyChart Message (if you have MyChart) OR . A paper copy in the mail If you have any lab test that is abnormal or we need to change your treatment, we will call you to review the results.  Testing/Procedures: YOU WILL BE SCHEDULED FOR A CAROTID ULTRASOUND IN NOVEMBER 2020. YOUR APPOINTMENT IS  Follow-Up: At [facility], you and your health needs are our priority.  As part of our continuing mission to provide you with exceptional heart care, we have created designated Provider Care Teams.  These Care Teams include your primary Cardiologist (physician) and Advanced Practice Providers (APPs -  Physician Assistants and Nurse Practitioners) who all work together to provide you with the care you need, when you need it. You will need a follow up appointment in 3 months.  Any Other Special Instructions Will Be Listed Below (If Applicable).   CHECK BP IN THE AFTERNOON AFTER RESTING 5 MINUTES 2 TIMES A WEEK AND RECORD    [employer]  Tips to measure your blood pressure correctly  To determine whether you have hypertension, a medical professional will take a blood pressure reading. How you prepare for the test, the position of your arm, and other factors can change a blood pressure reading by 10% or more. That could be enough to hide high blood pressure, start you on a drug you don't really need, or lead your doctor to incorrectly adjust your medications. National and international guidelines offer  specific instructions for measuring blood pressure. If a doctor, nurse, or medical assistant isn't doing it right, don't hesitate to ask him or her to get with the guidelines. Here's what you can do to ensure a correct reading: . Don't drink a caffeinated beverage or smoke during the 30 minutes before the test. . Sit quietly for five minutes before the test begins. . During the measurement, sit in a chair with your feet on the floor and your arm supported so your elbow is at about heart level. . The inflatable part of the cuff should completely cover at least 80% of your upper arm, and the cuff should be placed on bare skin, not over a shirt. . Don't talk during the measurement. . Have your blood pressure measured twice, with a brief break in between. If the readings are different by 5 points or more, have it done a third time. There are times to break these rules. If you sometimes feel lightheaded when getting out of bed in the morning or when you stand after sitting, you should have your blood pressure checked while seated and then while standing to see if it falls from one position to the next. Because blood pressure varies throughout the day, your doctor will rarely diagnose hypertension on the basis of a single reading. Instead, he or she will want to confirm the measurements on at least two occasions, usually within a few weeks of one another. The exception to this rule is if you have a blood pressure reading of 180/110 mm  Hg or higher. A result this high usually calls for prompt treatment. It's also a good idea to have your blood pressure measured in both arms at least once, since the reading in one arm (usually the right) may be higher than that in the left. A 2014 study in The American Journal of Medicine of nearly 3,400 people found average arm- to-arm differences in systolic blood pressure of about 5 points. The higher number should be used to make treatment decisions. In 2017, new guidelines  from the [place], the [employer] of Cardiology, and nine other health organizations lowered the diagnosis of high blood pressure to 130/80 mm Hg or higher for all adults. The guidelines also redefined the various blood pressure categories to now include normal, elevated, Stage 1 hypertension, Stage 2 hypertension, and hypertensive crisis (see "Blood pressure categories"). Blood pressure categories  Blood pressure category SYSTOLIC (upper number)  DIASTOLIC (lower number)  Normal Less than 120 mm Hg and Less than 80 mm Hg  Elevated 120-129 mm Hg and Less than 80 mm Hg  High blood pressure: Stage 1 hypertension 130-139 mm Hg or 80-89 mm Hg  High blood pressure: Stage 2 hypertension 140 mm Hg or higher or 90 mm Hg or higher  Hypertensive crisis (consult your doctor immediately) Higher than 180 mm Hg and/or Higher than 120 mm Hg  Source: American Heart Association and American Stroke Association. For more on getting your blood pressure under control, buy Controlling Your Blood Pressure, a Special Health Report from [facility].

## 2018-09-05 LAB — COMPREHENSIVE METABOLIC PANEL
ALT: 20 IU/L (ref 0–44)
AST: 21 IU/L (ref 0–40)
Albumin/Globulin Ratio: 2.1 (ref 1.2–2.2)
Albumin: 4.4 g/dL (ref 3.7–4.7)
Alkaline Phosphatase: 88 IU/L (ref 39–117)
BUN/Creatinine Ratio: 20 (ref 10–24)
BUN: 21 mg/dL (ref 8–27)
Bilirubin Total: 0.4 mg/dL (ref 0.0–1.2)
CO2: 22 mmol/L (ref 20–29)
Calcium: 10.6 mg/dL — ABNORMAL HIGH (ref 8.6–10.2)
Chloride: 104 mmol/L (ref 96–106)
Creatinine, Ser: 1.03 mg/dL (ref 0.76–1.27)
GFR calc Af Amer: 80 mL/min/{1.73_m2} (ref >59)
GFR calc non Af Amer: 69 mL/min/{1.73_m2} (ref >59)
Globulin, Total: 2.1 g/dL (ref 1.5–4.5)
Glucose: 100 mg/dL — ABNORMAL HIGH (ref 65–99)
Potassium: 3.9 mmol/L (ref 3.5–5.2)
Sodium: 144 mmol/L (ref 134–144)
Total Protein: 6.5 g/dL (ref 6.0–8.5)

## 2018-09-05 LAB — LIPID PANEL
Chol/HDL Ratio: 2.2 ratio (ref 0.0–5.0)
Cholesterol, Total: 100 mg/dL (ref 100–199)
HDL: 45 mg/dL (ref >39)
LDL Calculated: 34 mg/dL (ref 0–99)
Triglycerides: 107 mg/dL (ref 0–149)
VLDL Cholesterol Cal: 21 mg/dL (ref 5–40)

## 2018-09-15 DIAGNOSIS — I6523 Occlusion and stenosis of bilateral carotid arteries: Secondary | ICD-10-CM

## 2018-09-15 DIAGNOSIS — R011 Cardiac murmur, unspecified: Secondary | ICD-10-CM | POA: Insufficient documentation

## 2018-09-15 DIAGNOSIS — I350 Nonrheumatic aortic (valve) stenosis: Secondary | ICD-10-CM

## 2018-09-15 HISTORY — DX: Nonrheumatic aortic (valve) stenosis: I35.0

## 2018-09-15 HISTORY — DX: Occlusion and stenosis of bilateral carotid arteries: I65.23

## 2018-09-15 HISTORY — DX: Cardiac murmur, unspecified: R01.1

## 2018-12-24 ENCOUNTER — Ambulatory Visit: Payer: Medicare HMO | Admitting: Cardiology

## 2019-01-11 ENCOUNTER — Other Ambulatory Visit: Payer: Self-pay

## 2019-01-11 DIAGNOSIS — E7800 Pure hypercholesterolemia, unspecified: Secondary | ICD-10-CM

## 2019-01-11 NOTE — Progress Notes (Signed)
Lipid for pa 

## 2019-01-21 ENCOUNTER — Ambulatory Visit (INDEPENDENT_AMBULATORY_CARE_PROVIDER_SITE_OTHER): Payer: Medicare HMO

## 2019-01-21 ENCOUNTER — Other Ambulatory Visit: Payer: Self-pay

## 2019-01-21 DIAGNOSIS — I6521 Occlusion and stenosis of right carotid artery: Secondary | ICD-10-CM

## 2019-01-21 LAB — LIPID PANEL
Chol/HDL Ratio: 2.2 ratio (ref 0.0–5.0)
Cholesterol, Total: 114 mg/dL (ref 100–199)
HDL: 51 mg/dL (ref >39)
LDL Chol Calc (NIH): 40 mg/dL (ref 0–99)
Triglycerides: 133 mg/dL (ref 0–149)
VLDL Cholesterol Cal: 23 mg/dL (ref 5–40)

## 2019-01-21 NOTE — Progress Notes (Addendum)
Carotid duplex exam has been performed 60-79% right ICA stenosis, 40-59% left ICA stenosis.  Jimmy Coraima Tibbs RDCS, RVT

## 2019-01-26 ENCOUNTER — Other Ambulatory Visit: Payer: Self-pay

## 2019-01-26 ENCOUNTER — Ambulatory Visit (INDEPENDENT_AMBULATORY_CARE_PROVIDER_SITE_OTHER): Payer: Medicare HMO | Admitting: Cardiology

## 2019-01-26 ENCOUNTER — Encounter: Payer: Self-pay | Admitting: Cardiology

## 2019-01-26 VITALS — BP 172/80 | HR 84 | Ht 66.0 in | Wt 206.0 lb

## 2019-01-26 DIAGNOSIS — I25119 Atherosclerotic heart disease of native coronary artery with unspecified angina pectoris: Secondary | ICD-10-CM | POA: Diagnosis not present

## 2019-01-26 DIAGNOSIS — I10 Essential (primary) hypertension: Secondary | ICD-10-CM

## 2019-01-26 DIAGNOSIS — I6521 Occlusion and stenosis of right carotid artery: Secondary | ICD-10-CM

## 2019-01-26 DIAGNOSIS — E7800 Pure hypercholesterolemia, unspecified: Secondary | ICD-10-CM | POA: Diagnosis not present

## 2019-01-26 DIAGNOSIS — N183 Chronic kidney disease, stage 3 unspecified: Secondary | ICD-10-CM

## 2019-01-26 MED ORDER — OLMESARTAN MEDOXOMIL-HCTZ 40-12.5 MG PO TABS: 1.0000 | ORAL_TABLET | Freq: Every day | ORAL | 3 refills | Status: DC

## 2019-01-26 NOTE — Patient Instructions (Signed)
Medication Instructions:  Your physician has recommended you make the following change in your medication:  STOP losartan  START olmesartan-hydrochlorothiazide (benicar-HCT) 40-12.5 mg: Take 1 tablet daily   *If you need a refill on your cardiac medications before your next appointment, please call your pharmacy*  Lab Work: Your physician recommends that you return for lab work in 2 weeks: BMP. Please return to our [place] office for lab work, no appointment needed. No need to fast beforehand.  If you have labs (blood work) drawn today and your tests are completely normal, you will receive your results only by: Matthew Mcneil MyChart Message (if you have MyChart) OR . A paper copy in the mail If you have any lab test that is abnormal or we need to change your treatment, we will call you to review the results.  Testing/Procedures: You had an EKG today.   Follow-Up: At [facility], you and your health needs are our priority.  As part of our continuing mission to provide you with exceptional heart care, we have created designated Provider Care Teams.  These Care Teams include your primary Cardiologist (physician) and Advanced Practice Providers (APPs -  Physician Assistants and Nurse Practitioners) who all work together to provide you with the care you need, when you need it.  Your next appointment:   6 month(s)  The format for your next appointment:   In Person  Provider:   Norman Herrlich, MD  Other Instructions **Continue to check your blood pressure at home and record these readings. Bring your BP log with you when you return to our office for lab work in 2 weeks for Dr. Dulce Sellar to review.    Hydrochlorothiazide, HCTZ; Olmesartan Tablets What is this medicine? HYDROCHLOROTHIAZIDE; OLMESARTAN (hye droe klor oh THYE a zide; all mi SAR tan) is a combination of a diuretic and a drug that relaxes blood vessels. It is used to treat high blood pressure. This medicine may be used for other  purposes; ask your health care provider or pharmacist if you have questions. COMMON BRAND NAME(S): Benicar HCT What should I tell my health care provider before I take this medicine? They need to know if you have any of these conditions:  asthma  decreased urine  diabetes  if you are on a special diet, such as a low salt diet  immune system problems, like lupus  kidney disease  liver disease  an unusual reaction to hydrochlorothiazide, olmesartan, sulfa drugs, other medicines, foods, dyes, or preservatives  pregnant or trying to get pregnant  breast-feeding How should I use this medicine? Take this medicine by mouth with a glass of water. Follow the directions on the prescription label. You can take it with or without food. If it upsets your stomach, take it with food. Take your medicine at regular intervals. Do not take it more often than directed. Do not stop taking except on your doctor's advice. Talk to your pediatrician regarding the use of this medicine in children. Special care may be needed. Overdosage: If you think you have taken too much of this medicine contact a poison control center or emergency room at once. NOTE: This medicine is only for you. Do not share this medicine with others. What if I miss a dose? If you miss a dose, take it as soon as you can. If it is almost time for your next dose, take only that dose. Do not take double or extra doses. What may interact with this medicine?  barbiturates like phenobarbital  corticosteroids like prednisone  diuretics, especially triamterene, spironolactone or amiloride  lithium  medicines for diabetes  medicines for high blood pressure  NSAIDs like ibuprofen  potassium salts or potassium supplements  prescription pain medicines  skeletal muscle relaxants like tubocurarine  some cholesterol lowering medications like cholestyramine or colestipol This list may not describe all possible interactions. Give  your health care provider a list of all the medicines, herbs, non-prescription drugs, or dietary supplements you use. Also tell them if you smoke, drink alcohol, or use illegal drugs. Some items may interact with your medicine. What should I watch for while using this medicine? Check your blood pressure regularly while you are taking this medicine. Ask your doctor or health care professional what your blood pressure should be and when you should contact him or her. When you check your blood pressure, write down the measurements to show your doctor or health care professional. If you are taking this medicine for a long time, you must visit your health care professional for regular checks on your progress. Make sure you schedule appointments on a regular basis. You must not get dehydrated. Ask your doctor or health care professional how much fluid you need to drink a day. Check with him or her if you get an attack of severe diarrhea, nausea and vomiting, or if you sweat a lot. The loss of too much body fluid can make it dangerous for you to take this medicine. Women should inform their doctor if they wish to become pregnant or think they might be pregnant. There is a potential for serious side effects to an unborn child, particularly in the second or third trimester. Talk to your health care professional or pharmacist for more information. You may get drowsy or dizzy. Do not drive, use machinery, or do anything that needs mental alertness until you know how this drug affects you. Do not stand or sit up quickly, especially if you are an older patient. This reduces the risk of dizzy or fainting spells. Alcohol can make you more drowsy and dizzy. Avoid alcoholic drinks. This medicine may increase blood sugar. Ask your healthcare provider if changes in diet or medicines are needed if you have diabetes. Avoid salt substitutes unless you are told otherwise by your doctor or health care professional. Do not treat  yourself for coughs, colds, or pain while you are taking this medicine without asking your doctor or health care professional for advice. Some ingredients may increase your blood pressure. This medicine can make you more sensitive to the sun. Keep out of the sun. If you cannot avoid being in the sun, wear protective clothing and use sunscreen. Do not use sun lamps or tanning beds/booths. What side effects may I notice from receiving this medicine? Side effects that you should report to your doctor or health care professional as soon as possible:  allergic reactions like skin rash, itching or hives, swelling of the face, lips, or tongue  breathing problems  changes in vision  dark urine  diarrhea  eye pain  fast or irregular heart beat, palpitations, or chest pain  feeling faint or lightheaded  muscle cramps  persistent dry cough  redness, blistering, peeling or loosening of the skin, including inside the mouth   signs and symptoms of high blood sugar such as being more thirsty or hungry or having to urinate more than normal. You may also feel very tired or have blurry vision.  stomach pain  trouble passing urine  unusual bleeding or  bruising  vomiting  weight loss  worsened gout pain  yellowing of the eyes or skin Side effects that usually do not require medical attention (report to your doctor or health care professional if they continue or are bothersome):  change in sex drive or performance  headache  nausea This list may not describe all possible side effects. Call your doctor for medical advice about side effects. You may report side effects to FDA at 1-800-FDA-1088. Where should I keep my medicine? Keep out of the reach of children. Store your medicine at room temperature between 20 and 25 degrees C (68 and 77 degrees F). Throw away any unused medicine after the expiration date. NOTE: This sheet is a summary. It may not cover all possible information. If  you have questions about this medicine, talk to your doctor, pharmacist, or health care provider.  2020 Elsevier/Gold Standard (2017-12-17 10:02:36)

## 2019-01-26 NOTE — Progress Notes (Signed)
Cardiology Office Note:    Date:  01/26/2019   ID:  Matthew Mcneil, DOB Oct 19, 1940, MRN [id number]  PCP:  Gordan Payment., MD  Cardiologist:  Norman Herrlich, MD    Referring MD: Gordan Payment., MD    ASSESSMENT:    1. Coronary artery disease involving native coronary artery of native heart with angina pectoris (HCC)   2. Essential hypertension   3. Pure hypercholesterolemia   4. Stage 3 chronic kidney disease, unspecified whether stage 3a or 3b CKD   5. Carotid stenosis, asymptomatic, right    PLAN:    In order of problems listed above:  1. Stable CAD New [place] Heart Association class I having no angina current treatment and continue aspirin beta-blocker and lipid-lowering treatment. 2. Poorly controlled discontinue losartan go to high intensity ARB telmisartan along with a diuretic check home BPs reassess in 2 weeks 3. Well-controlled excellent response and continues PCSK9 treatment 4. Recheck renal function with thiazide diuretic 5. Stable carotid duplex   Next appointment: 6 mos   Medication Adjustments/Labs and Tests Ordered: Current medicines are reviewed at length with the patient today.  Concerns regarding medicines are outlined above.  No orders of the defined types were placed in this encounter.  No orders of the defined types were placed in this encounter.   Chief Complaint  Patient presents with  . Follow-up  . Coronary Artery Disease    History of Present Illness:    Matthew Mcneil is a 78 y.o. male with a hx of CAD PCI in 1989 subsequent heart catheterization 2002 with chronic total occlusion treated medically other problems include hypertension hyperlipidemia, stage III CKD and bradycardia and a carotid bruit with right coronary artery stenosis last seen 09/01/2018.  Compliance with diet, lifestyle and medications: Yes  I reviewed his quality points with  Carotid duplex 01/11/2019: Summary: Right Carotid: Velocities in the right ICA are  consistent with a 60-79% stenosis. Left Carotid: Velocities in the left ICA are consistent with a 40-59% stenosis. Vertebrals:  Bilateral vertebral arteries demonstrate antegrade flow. Subclavians: Normal flow hemodynamics were seen in bilateral subclavian arteries.  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  Despite that he remains active around the home he has had no exercise intolerance edema shortness of breath angina TIA or syncope.  Home blood pressures remain elevated greater than 1 60-1 70 systolic and I am going to add a ARB diuretic combination to his medical regimen.  Check blood pressures daily and return in 2 weeks with a list and check renal function for potassium.  He is pleased with PCSK9 therapy has noticed side effects of muscle symptoms lab 09/04/2018 cholesterol 100 LDL 34.  We will recheck his lipid profile will be seen in 2 weeks Past Medical History:  Diagnosis Date  . Anxiety 04/29/2016  . Benign prostate hyperplasia 04/28/2015  . Carotid artery occlusion   . 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 Chronic kidney disease, stage III (moderate) 04/28/2015  . Colon polyp 04/28/2015  . Coronary artery disease involving native coronary artery of native heart with angina pectoris (HCC) 05/06/2016  . Edema 04/28/2015  . Essential hypertension 04/28/2015  . High risk medication use 04/28/2015  . Hyperlipidemia 04/28/2015  . Hyperparathyroidism (HCC) 04/28/2015  . Impaired cognition 06/05/2016  . Malaise and fatigue 06/04/2016  . 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 Obesity 04/28/2015  . Osteoarthritis 04/28/2015  . Prediabetes 03/07/2017  .  Screening for prostate cancer 09/08/2017   Chooses no further testing  . Vitamin B12 deficiency 04/28/2015    Past Surgical History:  Procedure Laterality Date  . CARDIAC CATHETERIZATION    . CATARACT EXTRACTION, BILATERAL    . CORONARY ANGIOPLASTY    . HEMORROIDECTOMY       Current Medications: Current Meds  Medication Sig  . allopurinol (ZYLOPRIM) 100 MG tablet Take 1 tablet by mouth 2 (two) times daily.  Marland Kitchen. amLODipine (NORVASC) 5 MG tablet Take 1 tablet by mouth 2 (two) times daily.  Marland Kitchen. aspirin EC 81 MG tablet Take 1 tablet (81 mg total) by mouth daily.  . carvedilol (COREG) 3.125 MG tablet Take 3.125 mg by mouth daily.  . dorzolamidel-timolol (COSOPT) 22.3-6.8 MG/ML SOLN ophthalmic solution 1 drop 2 (two) times daily.  Marland Kitchen. escitalopram (LEXAPRO) 10 MG tablet Take 10 mg by mouth daily.  . Evolocumab (REPATHA SURECLICK) 140 MG/ML SOAJ Inject 140 mg into the skin every 14 (fourteen) days.  . finasteride (PROSCAR) 5 MG tablet Take 5 mg by mouth daily.  . furosemide (LASIX) 40 MG tablet Take 40 mg by mouth daily.  Marland Kitchen. latanoprost (XALATAN) 0.005 % ophthalmic solution Place 1 drop into both eyes at bedtime.  Marland Kitchen. losartan (COZAAR) 50 MG tablet TAKE 1 TABLET EVERY DAY  . Multiple Vitamins-Minerals (PRESERVISION AREDS 2 PO) Take 1 tablet by mouth 2 (two) times daily.  . nitroGLYCERIN (NITROSTAT) 0.4 MG SL tablet Place 1 tablet (0.4 mg total) under the tongue every 5 (five) minutes as needed for chest pain.  . Omega-3 Fatty Acids (FISH OIL) 1000 MG CAPS Take 1 capsule by mouth 2 (two) times daily.  . vitamin B-12 (CYANOCOBALAMIN) 500 MCG tablet Take 500 mcg by mouth every other day.     Allergies:   Spironolactone and Statins   Social History   Socioeconomic History  . Marital status: Married    Spouse name: Not on file  . Number of children: Not on file  . Years of education: Not on file  . Highest education level: Not on file  Occupational History  . Not on file  Social Needs  . Financial resource strain: Not on file  . Food insecurity    Worry: Not on file    Inability: Not on file  . Transportation needs    Medical: Not on file    Non-medical: Not on file  Tobacco Use  . Smoking status: Never Smoker  . Smokeless tobacco: Never Used  Substance and  Sexual Activity  . Alcohol use: Not Currently    Frequency: Never  . Drug use: Not Currently  . Sexual activity: Not on file  Lifestyle  . Physical activity    Days per week: Not on file    Minutes per session: Not on file  . Stress: Not on file  Relationships  . Social Musicianconnections    Talks on phone: Not on file    Gets together: Not on file    Attends religious service: Not on file    Active member of club or organization: Not on file    Attends meetings of clubs or organizations: Not on file    Relationship status: Not on file  Other Topics Concern  . Not on file  Social History Narrative  . Not on file     Family History: The patient's family history includes Cancer in his mother; Diabetes in his brother and mother; Heart attack in his brother, father, and mother; Hypertension in his  father and mother. ROS:   Please see the history of present illness.    All other systems reviewed and are negative.  EKGs/Labs/Other Studies Reviewed:    The following studies were reviewed today:  EKG:  EKG ordered today and personally reviewed.  The ekg ordered today demonstrates sinus rhythm and is normal  Recent Labs: 09/04/2018: ALT 20; BUN 21; Creatinine, Ser 1.03; Potassium 3.9; Sodium 144  Recent Lipid Panel    Component Value Date/Time   CHOL 114 01/21/2019 0953   TRIG 133 01/21/2019 0953   HDL 51 01/21/2019 0953   CHOLHDL 2.2 01/21/2019 0953   LDLCALC 40 01/21/2019 0953    Physical Exam:    VS:  BP (!) 172/80 (BP Location: Right Arm, Patient Position: Sitting, Cuff Size: Normal)   Pulse 84   Ht 5\' 6"  (1.676 m)   Wt 206 lb (93.4 kg)   SpO2 97%   BMI 33.25 kg/m     Wt Readings from Last 3 Encounters:  01/26/19 206 lb (93.4 kg)  09/01/18 205 lb 9.6 oz (93.3 kg)     GEN:  Well nourished, well developed in no acute distress HEENT: Normal NECK: No JVD; No carotid bruits LYMPHATICS: No lymphadenopathy CARDIAC: RRR, no murmurs, rubs, gallops RESPIRATORY:  Clear to  auscultation without rales, wheezing or rhonchi  ABDOMEN: Soft, non-tender, non-distended MUSCULOSKELETAL:  No edema; No deformity  SKIN: Warm and dry NEUROLOGIC:  Alert and oriented x 3 PSYCHIATRIC:  Normal affect    Signed, Shirlee More, MD  01/26/2019 3:35 PM    [facility]

## 2019-02-11 LAB — BASIC METABOLIC PANEL
BUN/Creatinine Ratio: 21 (ref 10–24)
BUN: 27 mg/dL (ref 8–27)
CO2: 22 mmol/L (ref 20–29)
Calcium: 10.6 mg/dL — ABNORMAL HIGH (ref 8.6–10.2)
Chloride: 105 mmol/L (ref 96–106)
Creatinine, Ser: 1.26 mg/dL (ref 0.76–1.27)
GFR calc Af Amer: 62 mL/min/{1.73_m2} (ref >59)
GFR calc non Af Amer: 54 mL/min/{1.73_m2} — ABNORMAL LOW (ref >59)
Glucose: 92 mg/dL (ref 65–99)
Potassium: 4.2 mmol/L (ref 3.5–5.2)
Sodium: 144 mmol/L (ref 134–144)

## 2019-02-16 ENCOUNTER — Telehealth: Payer: Self-pay | Admitting: Cardiology

## 2019-02-16 NOTE — Telephone Encounter (Signed)
Looking for repatha information, patient states repatha company has  Not  Heard back from [place]

## 2019-02-17 MED ORDER — REPATHA SURECLICK 140 MG/ML ~~LOC~~ SOAJ: 140.0000 mg | SUBCUTANEOUS | 12 refills | Status: DC

## 2019-02-17 NOTE — Addendum Note (Signed)
Addended by: Allean Found on: 02/17/2019 04:52 PM   Modules accepted: Orders

## 2019-02-17 NOTE — Telephone Encounter (Signed)
Spoke with patient's wife, Matthew Mcneil, per DPR who wanted an update on the status of renewal of repatha patient assistance as patient is completely out of this medication. Beecher Mcardle that this paperwork was given to the lipid clinic staff to process and they are currently working on completing it now to send off to the company. Matthew Mcneil is requesting a call back from the lipid clinic with an update as she has to call the company and let them know where to ship the medication. Thanks!

## 2019-02-17 NOTE — Telephone Encounter (Signed)
Returned a call to the pt's wife who stated that the pt was out of the repatha so I instructed her to apply through the [employer] and call us back w/answer. The pt's wife voiced understanding.

## 2019-02-17 NOTE — Telephone Encounter (Signed)
Pt called me and stated that they are approved through the [employer] and rx sent. Instructed the pt on how to use the copay card. Pt voiced understanding

## 2019-02-22 ENCOUNTER — Telehealth: Payer: Self-pay | Admitting: Cardiology

## 2019-02-22 NOTE — Telephone Encounter (Signed)
Please call in his Ol[place] /HCTZ 12.5 to the [phone]

## 2019-02-26 ENCOUNTER — Telehealth: Payer: Self-pay

## 2019-02-26 MED ORDER — OLMESARTAN MEDOXOMIL-HCTZ 40-12.5 MG PO TABS: 1.0000 | ORAL_TABLET | Freq: Every day | ORAL | 3 refills | Status: DC

## 2019-04-03 ENCOUNTER — Emergency Department (HOSPITAL_COMMUNITY): Payer: Medicare HMO

## 2019-04-03 ENCOUNTER — Emergency Department (HOSPITAL_COMMUNITY)
Admission: EM | Admit: 2019-04-03 | Discharge: 2019-04-03 | Disposition: A | Discharge status: Home / Self Care | Payer: Medicare HMO | Attending: Emergency Medicine | Admitting: Emergency Medicine

## 2019-04-03 ENCOUNTER — Encounter (HOSPITAL_COMMUNITY): Payer: Self-pay

## 2019-04-03 ENCOUNTER — Other Ambulatory Visit: Payer: Self-pay

## 2019-04-03 DIAGNOSIS — I129 Hypertensive chronic kidney disease with stage 1 through stage 4 chronic kidney disease, or unspecified chronic kidney disease: Secondary | ICD-10-CM | POA: Diagnosis not present

## 2019-04-03 DIAGNOSIS — Z79899 Other long term (current) drug therapy: Secondary | ICD-10-CM | POA: Insufficient documentation

## 2019-04-03 DIAGNOSIS — Z7982 Long term (current) use of aspirin: Secondary | ICD-10-CM | POA: Diagnosis not present

## 2019-04-03 DIAGNOSIS — I2510 Atherosclerotic heart disease of native coronary artery without angina pectoris: Secondary | ICD-10-CM | POA: Insufficient documentation

## 2019-04-03 DIAGNOSIS — U071 COVID-19: Secondary | ICD-10-CM

## 2019-04-03 DIAGNOSIS — I639 Cerebral infarction, unspecified: Secondary | ICD-10-CM | POA: Insufficient documentation

## 2019-04-03 DIAGNOSIS — R7989 Other specified abnormal findings of blood chemistry: Secondary | ICD-10-CM | POA: Insufficient documentation

## 2019-04-03 DIAGNOSIS — N183 Chronic kidney disease, stage 3 unspecified: Secondary | ICD-10-CM | POA: Diagnosis not present

## 2019-04-03 DIAGNOSIS — M791 Myalgia, unspecified site: Secondary | ICD-10-CM | POA: Diagnosis present

## 2019-04-03 HISTORY — DX: COVID-19: U07.1

## 2019-04-03 HISTORY — DX: Cerebral infarction, unspecified: I63.9

## 2019-04-03 LAB — CBC WITH DIFFERENTIAL/PLATELET
Abs Immature Granulocytes: 0.05 10*3/uL (ref 0.00–0.07)
Basophils Absolute: 0 10*3/uL (ref 0.0–0.1)
Basophils Relative: 0 %
Eosinophils Absolute: 0 10*3/uL (ref 0.0–0.5)
Eosinophils Relative: 0 %
HCT: 42.2 % (ref 39.0–52.0)
Hemoglobin: 13.4 g/dL (ref 13.0–17.0)
Immature Granulocytes: 1 %
Lymphocytes Relative: 28 %
Lymphs Abs: 2.3 10*3/uL (ref 0.7–4.0)
MCH: 28.8 pg (ref 26.0–34.0)
MCHC: 31.8 g/dL (ref 30.0–36.0)
MCV: 90.8 fL (ref 80.0–100.0)
Monocytes Absolute: 1.3 10*3/uL — ABNORMAL HIGH (ref 0.1–1.0)
Monocytes Relative: 15 %
Neutro Abs: 4.7 10*3/uL (ref 1.7–7.7)
Neutrophils Relative %: 56 %
Platelets: 147 10*3/uL — ABNORMAL LOW (ref 150–400)
RBC: 4.65 MIL/uL (ref 4.22–5.81)
RDW: 15.2 % (ref 11.5–15.5)
WBC: 8.4 10*3/uL (ref 4.0–10.5)
nRBC: 0 % (ref 0.0–0.2)

## 2019-04-03 LAB — BASIC METABOLIC PANEL
Anion gap: 12 (ref 5–15)
BUN: 48 mg/dL — ABNORMAL HIGH (ref 8–23)
CO2: 26 mmol/L (ref 22–32)
Calcium: 10.4 mg/dL — ABNORMAL HIGH (ref 8.9–10.3)
Chloride: 101 mmol/L (ref 98–111)
Creatinine, Ser: 1.69 mg/dL — ABNORMAL HIGH (ref 0.61–1.24)
GFR calc Af Amer: 44 mL/min — ABNORMAL LOW (ref >60)
GFR calc non Af Amer: 38 mL/min — ABNORMAL LOW (ref >60)
Glucose, Bld: 112 mg/dL — ABNORMAL HIGH (ref 70–99)
Potassium: 3.7 mmol/L (ref 3.5–5.1)
Sodium: 139 mmol/L (ref 135–145)

## 2019-04-03 LAB — POC SARS CORONAVIRUS 2 AG -  ED: SARS Coronavirus 2 Ag: POSITIVE — AB

## 2019-04-03 MED ORDER — SODIUM CHLORIDE 0.9 % IV BOLUS: 500.0000 mL | Freq: Once | INTRAVENOUS | Status: DC

## 2019-04-03 NOTE — ED Triage Notes (Signed)
Patient reports that he has been having generalized body aches and loss of appetite x 10 days.

## 2019-04-03 NOTE — ED Provider Notes (Signed)
[facility] DEPT Provider Note   CSN: [id number] Arrival date & time: 04/03/19  1836     History Chief Complaint  Patient presents with  . Generalized Body Aches  . Anorexia    Matthew Mcneil is a 79 y.o. male.  Patient is a 79 year old male with a history of chronic kidney disease, coronary artery disease, hypertension, hyperlipidemia who presents with body aches and fatigue.  He states his symptoms started about 10 days ago.  He does not really report any fevers.  He does says he does not have much of an appetite.  No vomiting.  No diarrhea.  No cough or shortness of breath.  His wife is here with similar symptoms.        Past Medical History:  Diagnosis Date  . Anxiety 04/29/2016  . Benign prostate hyperplasia 04/28/2015  . Carotid artery occlusion   . 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 Chronic kidney disease, stage III (moderate) 04/28/2015  . Colon polyp 04/28/2015  . Coronary artery disease involving native coronary artery of native heart with angina pectoris ([place]) 05/06/2016  . Edema 04/28/2015  . Essential hypertension 04/28/2015  . High risk medication use 04/28/2015  . Hyperlipidemia 04/28/2015  . Hyperparathyroidism ([place]) 04/28/2015  . Impaired cognition 06/05/2016  . Malaise and fatigue 06/04/2016  . Mild episode of recurrent major depressive disorder ([place]) 05/16/2016  . Obesity 04/28/2015  . Osteoarthritis 04/28/2015  . Prediabetes 03/07/2017  . Screening for prostate cancer 09/08/2017   Chooses no further testing  . Vitamin B12 deficiency 04/28/2015    Patient Active Problem List   Diagnosis Date Noted  . Abnormal ultrasound of carotid artery 01/23/2018  . Screening for prostate cancer 09/08/2017  . Prediabetes 03/07/2017  . Impaired cognition 06/05/2016  . Malaise and fatigue 06/04/2016  . Mild episode of recurrent major depressive disorder ([place]) 05/16/2016  . Coronary artery disease involving native coronary artery of native  heart with angina pectoris ([place]) 05/06/2016  . Anxiety 04/29/2016  . Benign prostate hyperplasia 04/28/2015  . 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 Chronic kidney disease, stage III (moderate) 04/28/2015  . Obesity 04/28/2015  . Colon polyp 04/28/2015  . Edema 04/28/2015  . Essential hypertension 04/28/2015  . High risk medication use 04/28/2015  . Hyperparathyroidism ([place]) 04/28/2015  . Hyperlipidemia 04/28/2015  . Osteoarthritis 04/28/2015  . Vitamin B12 deficiency 04/28/2015    Past Surgical History:  Procedure Laterality Date  . CARDIAC CATHETERIZATION    . CATARACT EXTRACTION, BILATERAL    . CORONARY ANGIOPLASTY    . HEMORROIDECTOMY         Family History  Problem Relation Age of Onset  . Cancer Mother   . Heart attack Mother   . Hypertension Mother   . Diabetes Mother   . Hypertension Father   . Heart attack Father   . Heart attack Brother   . Diabetes Brother     Social History   Tobacco Use  . Smoking status: Never Smoker  . Smokeless tobacco: Never Used  Substance Use Topics  . Alcohol use: Not Currently  . Drug use: Not Currently    Home Medications Prior to Admission medications   Medication Sig Start Date End Date Taking? Authorizing Provider  allopurinol (ZYLOPRIM) 100 MG tablet Take 1 tablet by mouth 2 (two) times daily. 10/15/17   [provider]  amLODipine (NORVASC) 5 MG tablet Take 1 tablet by mouth 2 (two) times daily. 10/05/17   [provider]  aspirin EC  81 MG tablet Take 1 tablet (81 mg total) by mouth daily. 12/09/17   Baldo Daub, MD  carvedilol (COREG) 3.125 MG tablet Take 3.125 mg by mouth daily.    [provider]  dorzolamidel-timolol (COSOPT) 22.3-6.8 MG/ML SOLN ophthalmic solution 1 drop 2 (two) times daily.    [provider]  escitalopram (LEXAPRO) 10 MG tablet Take 10 mg by mouth daily.    [provider]  Evolocumab (REPATHA SURECLICK) 140 MG/ML SOAJ Inject 140 mg into the skin  every 14 (fourteen) days. 02/17/19   Baldo Daub, MD  finasteride (PROSCAR) 5 MG tablet Take 5 mg by mouth daily.    [provider]  furosemide (LASIX) 40 MG tablet Take 40 mg by mouth daily.    [provider]  latanoprost (XALATAN) 0.005 % ophthalmic solution Place 1 drop into both eyes at bedtime.    [provider]  Multiple Vitamins-Minerals (PRESERVISION AREDS 2 PO) Take 1 tablet by mouth 2 (two) times daily.    [provider]  nitroGLYCERIN (NITROSTAT) 0.4 MG SL tablet Place 1 tablet (0.4 mg total) under the tongue every 5 (five) minutes as needed for chest pain. 12/22/17 01/25/22  Baldo Daub, MD  olmesartan-hydrochlorothiazide (BENICAR HCT) 40-12.5 MG tablet Take 1 tablet by mouth daily. 02/26/19   Baldo Daub, MD  Omega-3 Fatty Acids (FISH OIL) 1000 MG CAPS Take 1 capsule by mouth 2 (two) times daily.    [provider]  vitamin B-12 (CYANOCOBALAMIN) 500 MCG tablet Take 500 mcg by mouth every other day.    [provider]    Allergies    Spironolactone and Statins  Review of Systems   Review of Systems  Constitutional: Positive for appetite change and fatigue. Negative for chills, diaphoresis and fever.  HENT: Negative for congestion, rhinorrhea and sneezing.   Eyes: Negative.   Respiratory: Negative for cough, chest tightness and shortness of breath.   Cardiovascular: Negative for chest pain and leg swelling.  Gastrointestinal: Negative for abdominal pain, blood in stool, diarrhea, nausea and vomiting.  Genitourinary: Negative for difficulty urinating, flank pain, frequency and hematuria.  Musculoskeletal: Positive for myalgias. Negative for arthralgias and back pain.  Skin: Negative for rash.  Neurological: Negative for dizziness, speech difficulty, weakness, numbness and headaches.    Physical Exam Updated Vital Signs BP 124/62   Pulse 71   Temp 98.3 F (36.8 C)   Resp 17   Ht 5\' 6"  (1.676 m)   Wt 89.8  kg   SpO2 95%   BMI 31.96 kg/m   Physical Exam Constitutional:      Appearance: He is well-developed.  HENT:     Head: Normocephalic and atraumatic.  Eyes:     Pupils: Pupils are equal, round, and reactive to light.  Cardiovascular:     Rate and Rhythm: Normal rate and regular rhythm.     Heart sounds: Normal heart sounds.  Pulmonary:     Effort: Pulmonary effort is normal. No respiratory distress.     Breath sounds: Normal breath sounds. No wheezing or rales.  Chest:     Chest wall: No tenderness.  Abdominal:     General: Bowel sounds are normal.     Palpations: Abdomen is soft.     Tenderness: There is no abdominal tenderness. There is no guarding or rebound.  Musculoskeletal:        General: Normal range of motion.     Cervical back: Normal range of motion and  neck supple.  Lymphadenopathy:     Cervical: No cervical adenopathy.  Skin:    General: Skin is warm and dry.     Findings: No rash.  Neurological:     Mental Status: He is alert and oriented to person, place, and time.     ED Results / Procedures / Treatments   Labs (all labs ordered are listed, but only abnormal results are displayed) Labs Reviewed  BASIC METABOLIC PANEL - Abnormal; Notable for the following components:      Result Value   Glucose, Bld 112 (*)    BUN 48 (*)    Creatinine, Ser 1.69 (*)    Calcium 10.4 (*)    GFR calc non Af Amer 38 (*)    GFR calc Af Amer 44 (*)    All other components within normal limits  CBC WITH DIFFERENTIAL/PLATELET - Abnormal; Notable for the following components:   Platelets 147 (*)    Monocytes Absolute 1.3 (*)    All other components within normal limits  POC SARS CORONAVIRUS 2 AG -  ED - Abnormal; Notable for the following components:   SARS Coronavirus 2 Ag POSITIVE (*)    All other components within normal limits    EKG None  Radiology DG Chest Port 1 View  Result Date: 04/03/2019 CLINICAL DATA:  Fatigue and cough. EXAM: PORTABLE CHEST 1 VIEW  COMPARISON:  April 16, 2006 FINDINGS: The heart size and mediastinal contours are within normal limits. Both lungs are clear. The visualized skeletal structures are unremarkable. IMPRESSION: No active disease. Electronically Signed   By: Aram Candela M.D.   On: 04/03/2019 21:28    Procedures Procedures (including critical care time)  Medications Ordered in ED Medications - No data to display  ED Course  I have reviewed the triage vital signs and the nursing notes.  Pertinent labs & imaging results that were available during my care of the patient were reviewed by me and considered in my medical decision making (see chart for details).    MDM Rules/Calculators/A&P                      Patient is Covid test is positive.  His labs show a mild elevation in his creatinine but otherwise unremarkable.  He has clear chest x-ray.  No shortness of breath.  No hypoxia.  His vital signs are stable.  He is otherwise well-appearing.  He does not report any vomiting.  He does say that he will increase his fluid intake at home to stay hydrated.  I advised him that he will need to have his creatinine followed by his primary care physician.  He was given strict return precautions. Final Clinical Impression(s) / ED Diagnoses Final diagnoses:  COVID-19 virus infection  Elevated serum creatinine    Rx / DC Orders ED Discharge Orders    None       Rolan Bucco, MD 04/03/19 2220

## 2019-04-03 NOTE — ED Notes (Signed)
Called daughter to come pick up father. Left a message.

## 2019-04-03 NOTE — Discharge Instructions (Addendum)
Your kidney function was a little bit worse than it has been in the past.  Make sure that you are staying hydrated at home.  You need to have this rechecked within the next couple of weeks by your primary care physician.  Stay quarantined as directed during this Covid infection.  Return to the emergency room if you have any worsening symptoms, particularly any worsening shortness of breath or ongoing vomiting.

## 2019-05-03 ENCOUNTER — Other Ambulatory Visit: Payer: Self-pay | Admitting: *Deleted

## 2019-05-03 ENCOUNTER — Telehealth: Payer: Self-pay | Admitting: Cardiology

## 2019-05-03 MED ORDER — OLMESARTAN MEDOXOMIL-HCTZ 40-12.5 MG PO TABS: 1.0000 | ORAL_TABLET | Freq: Every day | ORAL | 1 refills | Status: DC

## 2019-05-03 NOTE — Telephone Encounter (Signed)
Refill sent to [facility].

## 2019-05-03 NOTE — Telephone Encounter (Signed)
*  STAT* If patient is at the pharmacy, call can be transferred to refill team.   1. Which medications need to be refilled? (please list name of each medication and dose if known) olmesartan-hydrochlorothiazide (BENICAR HCT) 40-12.5 MG tablet  2. Which pharmacy/location (including street and city if local pharmacy) is medication to be sent to? [facility] - [place], [place] - [address]  3. Do they need a 30 day or 90 day supply? 90

## 2019-05-07 ENCOUNTER — Encounter: Payer: Self-pay | Admitting: Cardiology

## 2019-05-07 ENCOUNTER — Ambulatory Visit (INDEPENDENT_AMBULATORY_CARE_PROVIDER_SITE_OTHER): Payer: Medicare HMO | Admitting: Cardiology

## 2019-05-07 ENCOUNTER — Other Ambulatory Visit: Payer: Self-pay

## 2019-05-07 VITALS — BP 130/68 | HR 88 | Temp 98.2°F | Ht 66.0 in | Wt 196.2 lb

## 2019-05-07 DIAGNOSIS — E7800 Pure hypercholesterolemia, unspecified: Secondary | ICD-10-CM | POA: Diagnosis not present

## 2019-05-07 DIAGNOSIS — I6521 Occlusion and stenosis of right carotid artery: Secondary | ICD-10-CM

## 2019-05-07 DIAGNOSIS — N183 Chronic kidney disease, stage 3 unspecified: Secondary | ICD-10-CM | POA: Diagnosis not present

## 2019-05-07 DIAGNOSIS — I10 Essential (primary) hypertension: Secondary | ICD-10-CM | POA: Diagnosis not present

## 2019-05-07 DIAGNOSIS — I25119 Atherosclerotic heart disease of native coronary artery with unspecified angina pectoris: Secondary | ICD-10-CM

## 2019-05-07 NOTE — Patient Instructions (Signed)
Medication Instructions:  Your physician has recommended you make the following change in your medication:   STOP: Carvedilol  *If you need a refill on your cardiac medications before your next appointment, please call your pharmacy*   Lab Work: None If you have labs (blood work) drawn today and your tests are completely normal, you will receive your results only by: Marland Kitchen MyChart Message (if you have MyChart) OR . A paper copy in the mail If you have any lab test that is abnormal or we need to change your treatment, we will call you to review the results.   Testing/Procedures: None   Follow-Up: At [facility], you and your health needs are our priority.  As part of our continuing mission to provide you with exceptional heart care, we have created designated Provider Care Teams.  These Care Teams include your primary Cardiologist (physician) and Advanced Practice Providers (APPs -  Physician Assistants and Nurse Practitioners) who all work together to provide you with the care you need, when you need it.  We recommend signing up for the patient portal called "MyChart".  Sign up information is provided on this After Visit Summary.  MyChart is used to connect with patients for Virtual Visits (Telemedicine).  Patients are able to view lab/test results, encounter notes, upcoming appointments, etc.  Non-urgent messages can be sent to your provider as well.   To learn more about what you can do with MyChart, go to [email or web address].    Your next appointment:   6 month(s)  The format for your next appointment:   In Person  Provider:   Norman Herrlich, MD   Other Instructions

## 2019-05-07 NOTE — Progress Notes (Signed)
Cardiology Office Note:    Date:  05/07/2019   ID:  Matthew Mcneil, DOB 1940/12/05, MRN [id number]  PCP:  Raina Mina., MD  Cardiologist:  Shirlee More, MD    Referring MD: Raina Mina., MD    ASSESSMENT:    1. Essential hypertension   2. Coronary artery disease involving native coronary artery of native heart with angina pectoris ([place])   3. Pure hypercholesterolemia   4. Stage 3 chronic kidney disease, unspecified whether stage 3a or 3b CKD   5. Carotid stenosis, asymptomatic, right    PLAN:    In order of problems listed above:  1. Improved BP is at target continue current treatment septal stop is minimal dose of beta-blocker because of CNS side effects is a new problem..  I told his daughter he should improve in the next 1 to 2 weeks.  We will continue to monitor at home and I gave him tips for how to accurately measure blood pressure. 2. CAD continue medical therapy. 3. Continue current lipid-lowering treatment with statin lipids at target 4. Stable on recent labs 5. Follow-up duplex at the end of this year asymptomatic continue medical treatment   Next appointment: 6 months   Medication Adjustments/Labs and Tests Ordered: Current medicines are reviewed at length with the patient today.  Concerns regarding medicines are outlined above.  No orders of the defined types were placed in this encounter.  No orders of the defined types were placed in this encounter.   Chief Complaint  Patient presents with  . Follow-up  . Coronary Artery Disease    History of Present Illness:    Matthew Mcneil is a 79 y.o. male with a hx of CAD PCI in 1989 subsequent heart catheterization 2002 with chronic total occlusion treated medically other problems include hypertension hyperlipidemia, stage III CKD and bradycardia and a carotid bruit with right coronary artery stenosis  last seen 01/26/2019. Compliance with diet, lifestyle and medications: Yes He was diagnosed  with COVID 19 01/23/2021with GI symptoms.  CXR 04/03/2019 independently reviewed: FINDINGS: The heart size and mediastinal contours are within normal limits. Both lungs are clear. The visualized skeletal structures are unremarkable. IMPRESSION: No active disease.  Since he and his wife with COVID-19 has not been the same is having difficulty sleeping sounds like he is having vivid dreams or terror is apathetic and is just generally unsteady.  Home blood pressures are in target and is 128/66 sitting and standing by me in the office.  Previously had CNS side effects from the beta-blocker I think is the same problem he will discontinue carvedilol his blood pressures in range and continue to monitor at home. Past Medical History:  Diagnosis Date  . Anxiety 04/29/2016  . Benign prostate hyperplasia 04/28/2015  . Carotid artery occlusion   . 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 Chronic kidney disease, stage III (moderate) 04/28/2015  . Colon polyp 04/28/2015  . Coronary artery disease involving native coronary artery of native heart with angina pectoris ([place]) 05/06/2016  . Edema 04/28/2015  . Essential hypertension 04/28/2015  . High risk medication use 04/28/2015  . Hyperlipidemia 04/28/2015  . Hyperparathyroidism ([place]) 04/28/2015  . Impaired cognition 06/05/2016  . Malaise and fatigue 06/04/2016  . Mild episode of recurrent major depressive disorder ([place]) 05/16/2016  . Obesity 04/28/2015  . Osteoarthritis 04/28/2015  . Prediabetes 03/07/2017  . Screening for prostate cancer 09/08/2017   Chooses no further testing  . Vitamin B12 deficiency 04/28/2015    Past Surgical  History:  Procedure Laterality Date  . CARDIAC CATHETERIZATION    . CATARACT EXTRACTION, BILATERAL    . CORONARY ANGIOPLASTY    . HEMORROIDECTOMY      Current Medications: Current Meds  Medication Sig  . allopurinol (ZYLOPRIM) 100 MG tablet Take 1 tablet by mouth 2 (two) times daily.  Marland Kitchen amLODipine (NORVASC) 5 MG tablet Take 1  tablet by mouth 2 (two) times daily.  Marland Kitchen aspirin EC 81 MG tablet Take 1 tablet (81 mg total) by mouth daily.  Marland Kitchen escitalopram (LEXAPRO) 10 MG tablet Take 10 mg by mouth daily.  . Evolocumab (REPATHA SURECLICK) 140 MG/ML SOAJ Inject 140 mg into the skin every 14 (fourteen) days.  . finasteride (PROSCAR) 5 MG tablet Take 5 mg by mouth daily.  . furosemide (LASIX) 40 MG tablet Take 40 mg by mouth daily.  . Multiple Vitamins-Minerals (PRESERVISION AREDS 2 PO) Take 1 tablet by mouth 2 (two) times daily.  . nitroGLYCERIN (NITROSTAT) 0.4 MG SL tablet Place 1 tablet (0.4 mg total) under the tongue every 5 (five) minutes as needed for chest pain.  Marland Kitchen olmesartan-hydrochlorothiazide (BENICAR HCT) 40-12.5 MG tablet Take 1 tablet by mouth daily.  . Omega-3 Fatty Acids (FISH OIL) 1000 MG CAPS Take 1 capsule by mouth 2 (two) times daily.  . vitamin B-12 (CYANOCOBALAMIN) 500 MCG tablet Take 500 mcg by mouth every other day.  . [DISCONTINUED] carvedilol (COREG) 3.125 MG tablet Take 3.125 mg by mouth daily.     Allergies:   Spironolactone and Statins   Social History   Socioeconomic History  . Marital status: Married    Spouse name: Not on file  . Number of children: Not on file  . Years of education: Not on file  . Highest education level: Not on file  Occupational History  . Not on file  Tobacco Use  . Smoking status: Never Smoker  . Smokeless tobacco: Never Used  Substance and Sexual Activity  . Alcohol use: Not Currently  . Drug use: Not Currently  . Sexual activity: Not Currently  Other Topics Concern  . Not on file  Social History Narrative  . Not on file   Social Determinants of Health   Financial Resource Strain:   . Difficulty of Paying Living Expenses: Not on file  Food Insecurity:   . Worried About Programme researcher, broadcasting/film/video in the Last Year: Not on file  . Ran Out of Food in the Last Year: Not on file  Transportation Needs:   . Lack of Transportation (Medical): Not on file  . Lack of  Transportation (Non-Medical): Not on file  Physical Activity:   . Days of Exercise per Week: Not on file  . Minutes of Exercise per Session: Not on file  Stress:   . Feeling of Stress : Not on file  Social Connections:   . Frequency of Communication with Friends and Family: Not on file  . Frequency of Social Gatherings with Friends and Family: Not on file  . Attends Religious Services: Not on file  . Active Member of Clubs or Organizations: Not on file  . Attends Banker Meetings: Not on file  . Marital Status: Not on file     Family History: The patient's family history includes Cancer in his mother; Diabetes in his brother and mother; Heart attack in his brother, father, and mother; Hypertension in his father and mother. ROS:   Please see the history of present illness.    All other systems reviewed  and are negative.  EKGs/Labs/Other Studies Reviewed:    The following studies were reviewed today:   Carotid duplex 01/11/2019: Summary: Right Carotid: Velocities in the right ICA are consistent with a 60-79% stenosis. Left Carotid: Velocities in the left ICA are consistent with a 40-59% stenosis. Vertebrals:  Bilateral vertebral arteries demonstrate antegrade flow. Subclavians: Normal flow hemodynamics were seen in bilateral subclavian arteries.  Recent Labs:  03/25/2019 [facility] labs: Potassium 4.2 creatinine 1.35 GFR 50 cc 09/04/2018: ALT 20 04/03/2019: BUN 48; Creatinine, Ser 1.69; Hemoglobin 13.4; Platelets 147; Potassium 3.7; Sodium 139  Recent Lipid Panel    Component Value Date/Time   CHOL 114 01/21/2019 0953   TRIG 133 01/21/2019 0953   HDL 51 01/21/2019 0953   CHOLHDL 2.2 01/21/2019 0953   LDLCALC 40 01/21/2019 0953    Physical Exam:    VS:  BP 130/68   Pulse 88   Temp 98.2 F (36.8 C)   Ht 5\' 6"  (1.676 m)   Wt 196 lb 3.2 oz (89 kg)   SpO2 97%   BMI 31.67 kg/m     Wt Readings from Last 3 Encounters:  05/07/19 196 lb 3.2 oz (89  kg)  04/03/19 198 lb (89.8 kg)  01/26/19 206 lb (93.4 kg)    Pressure by me is 128/66 sitting and standing GEN:  Well nourished, well developed in no acute distress HEENT: Normal NECK: No JVD; No carotid bruits LYMPHATICS: No lymphadenopathy CARDIAC: RRR, no murmurs, rubs, gallops RESPIRATORY:  Clear to auscultation without rales, wheezing or rhonchi  ABDOMEN: Soft, non-tender, non-distended MUSCULOSKELETAL:  No edema; No deformity  SKIN: Warm and dry NEUROLOGIC:  Alert and oriented x 3 PSYCHIATRIC:  Normal affect    Signed, 01/28/19, MD  05/07/2019 8:48 AM    [facility]

## 2019-05-20 DIAGNOSIS — Z8616 Personal history of COVID-19: Secondary | ICD-10-CM | POA: Insufficient documentation

## 2019-05-20 HISTORY — DX: Personal history of COVID-19: Z86.16

## 2019-08-03 ENCOUNTER — Telehealth: Payer: Self-pay | Admitting: Cardiology

## 2019-08-03 NOTE — Telephone Encounter (Signed)
New Message    Pts wife is calling and says the pts daughter will have to come in with him because he doesn't understand what the Dr will say or ask him.   Please advise

## 2019-08-03 NOTE — Telephone Encounter (Signed)
Spoke with the patients wife just now and she let me know that the patient will need someone to assist him to his appointment due to a hearing and memory impairment. I let her know that this would be fine.    Encouraged patient to call back with any questions or concerns.

## 2019-08-04 ENCOUNTER — Other Ambulatory Visit: Payer: Self-pay

## 2019-08-06 ENCOUNTER — Other Ambulatory Visit: Payer: Self-pay

## 2019-08-06 ENCOUNTER — Ambulatory Visit: Payer: Medicare HMO | Admitting: Cardiology

## 2019-08-06 ENCOUNTER — Encounter: Payer: Self-pay | Admitting: Cardiology

## 2019-08-06 VITALS — BP 142/72 | HR 94 | Ht 66.0 in | Wt 201.6 lb

## 2019-08-06 DIAGNOSIS — R011 Cardiac murmur, unspecified: Secondary | ICD-10-CM

## 2019-08-06 DIAGNOSIS — I25119 Atherosclerotic heart disease of native coronary artery with unspecified angina pectoris: Secondary | ICD-10-CM | POA: Diagnosis not present

## 2019-08-06 DIAGNOSIS — N183 Chronic kidney disease, stage 3 unspecified: Secondary | ICD-10-CM

## 2019-08-06 DIAGNOSIS — I10 Essential (primary) hypertension: Secondary | ICD-10-CM | POA: Diagnosis not present

## 2019-08-06 DIAGNOSIS — E7800 Pure hypercholesterolemia, unspecified: Secondary | ICD-10-CM | POA: Diagnosis not present

## 2019-08-06 DIAGNOSIS — I6521 Occlusion and stenosis of right carotid artery: Secondary | ICD-10-CM

## 2019-08-06 NOTE — Progress Notes (Signed)
Cardiology Office Note:    Date:  08/06/2019   ID:  Matthew Mcneil, DOB [birth year], MRN [id number]  PCP:  Gordan Payment., MD  Cardiologist:  Norman Herrlich, MD    Referring MD: Gordan Payment., MD    ASSESSMENT:    1. Coronary artery disease involving native coronary artery of native heart with angina pectoris (HCC)   2. Essential hypertension   3. Pure hypercholesterolemia   4. Stage 3 chronic kidney disease, unspecified whether stage 3a or 3b CKD   5. Carotid stenosis, asymptomatic, right    PLAN:    In order of problems listed above:  1. Stable CAD, having no angina continue current medical therapy of aspirin none statin PCSK9 inhibitor Repatha and calcium channel blocker. 2. BP at target continue current antihypertensive 3. Stable CKD 4. Stable lipids continue Repatha 5. Recheck carotid duplex November and also echocardiogram for murmur   Next appointment: 6 months   Medication Adjustments/Labs and Tests Ordered: Current medicines are reviewed at length with the patient today.  Concerns regarding medicines are outlined above.  No orders of the defined types were placed in this encounter.  No orders of the defined types were placed in this encounter.   No chief complaint on file.   History of Present Illness:    Matthew Mcneil is a 79 y.o. male with a hx of CAD PCI in 1989 subsequent heart catheterization 2002 with chronic total occlusion treated medically other problems include hypertension hyperlipidemia, stage III CKD and bradycardia and a carotid bruit with right coronary artery stenosis last seen 05/06/2018. Compliance with diet, lifestyle and medications: Yes  Remains vigorous active has no exercise intolerance angina dyspnea palpitation or syncope.  He tolerates PCSK9 inhibitor lipids are ideal.  Carotid duplex November 2021 showed 60 to 79% stenosis in the right 40 to 59% stenosis on the left.  He has a heart murmur more prominent than I  remember in the past echocardiogram in 2018 showed no stenosis will redo his carotid duplex this fall we will do an echocardiogram for murmur. Past Medical History:  Diagnosis Date  . Anxiety 04/29/2016  . Benign prostate hyperplasia 04/28/2015  . Carotid artery occlusion   . 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 Chronic kidney disease, stage III (moderate) 04/28/2015  . Colon polyp 04/28/2015  . Coronary artery disease involving native coronary artery of native heart with angina pectoris (HCC) 05/06/2016  . Edema 04/28/2015  . Essential hypertension 04/28/2015  . High risk medication use 04/28/2015  . Hyperlipidemia 04/28/2015  . Hyperparathyroidism (HCC) 04/28/2015  . Impaired cognition 06/05/2016  . Malaise and fatigue 06/04/2016  . 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 Obesity 04/28/2015  . Osteoarthritis 04/28/2015  . Prediabetes 03/07/2017  . Screening for prostate cancer 09/08/2017   Chooses no further testing  . Vitamin B12 deficiency 04/28/2015    Past Surgical History:  Procedure Laterality Date  . CARDIAC CATHETERIZATION    . CATARACT EXTRACTION, BILATERAL    . CORONARY ANGIOPLASTY    . HEMORROIDECTOMY      Current Medications: Current Meds  Medication Sig  . allopurinol (ZYLOPRIM) 100 MG tablet Take 1 tablet by mouth 2 (two) times daily.  Marland Kitchen amLODipine (NORVASC) 5 MG tablet Take 1 tablet by mouth 2 (two) times daily.  Marland Kitchen aspirin EC 81 MG tablet Take 1 tablet (81 mg total) by mouth daily.  Marland Kitchen escitalopram (LEXAPRO) 10 MG tablet Take 10 mg by mouth daily.  . Evolocumab (  REPATHA SURECLICK) 176 MG/ML SOAJ Inject 140 mg into the skin every 14 (fourteen) days.  . finasteride (PROSCAR) 5 MG tablet Take 5 mg by mouth daily.  . furosemide (LASIX) 40 MG tablet Take 40 mg by mouth daily. Patient states that he takes 40 mg in the morning and 20 mg at night.  . nitroGLYCERIN (NITROSTAT) 0.4 MG SL tablet Place 1 tablet (0.4 mg total) under the tongue every 5  (five) minutes as needed for chest pain.  Marland Kitchen olmesartan (BENICAR) 40 MG tablet Take 1 tablet by mouth daily.  . Omega-3 Fatty Acids (FISH OIL) 1000 MG CAPS Take 1 capsule by mouth 2 (two) times daily.  . vitamin B-12 (CYANOCOBALAMIN) 500 MCG tablet Take 500 mcg by mouth every other day.     Allergies:   Spironolactone and Statins   Social History   Socioeconomic History  . Marital status: Married    Spouse name: Not on file  . Number of children: Not on file  . Years of education: Not on file  . Highest education level: Not on file  Occupational History  . Not on file  Tobacco Use  . Smoking status: Never Smoker  . Smokeless tobacco: Never Used  Substance and Sexual Activity  . Alcohol use: Not Currently  . Drug use: Not Currently  . Sexual activity: Not Currently  Other Topics Concern  . Not on file  Social History Narrative  . Not on file   Social Determinants of Health   Financial Resource Strain:   . Difficulty of Paying Living Expenses:   Food Insecurity:   . Worried About Charity fundraiser in the Last Year:   . Arboriculturist in the Last Year:   Transportation Needs:   . Film/video editor (Medical):   Marland Kitchen Lack of Transportation (Non-Medical):   Physical Activity:   . Days of Exercise per Week:   . Minutes of Exercise per Session:   Stress:   . Feeling of Stress :   Social Connections:   . Frequency of Communication with Friends and Family:   . Frequency of Social Gatherings with Friends and Family:   . Attends Religious Services:   . Active Member of Clubs or Organizations:   . Attends Archivist Meetings:   Marland Kitchen Marital Status:      Family History: The patient's family history includes Cancer in his mother; Diabetes in his brother and mother; Heart attack in his brother, father, and mother; Hypertension in his father and mother. ROS:   Please see the history of present illness.    All other systems reviewed and are  negative.  EKGs/Labs/Other Studies Reviewed:    The following studies were reviewed today:  EKG:  EKG 01/26/2020 sinus rhythm normal  Recent Labs: 09/04/2018: ALT 20 04/03/2019: BUN 48; Creatinine, Ser 1.69; Hemoglobin 13.4; Platelets 147; Potassium 3.7; Sodium 139  Recent Lipid Panel    Component Value Date/Time   CHOL 114 01/21/2019 0953   TRIG 133 01/21/2019 0953   HDL 51 01/21/2019 0953   CHOLHDL 2.2 01/21/2019 0953   LDLCALC 40 01/21/2019 0953    Physical Exam:    VS:  BP (!) 142/72 (BP Location: Right Arm, Patient Position: Sitting, Cuff Size: Normal)   Pulse 94   Ht 5\' 6"  (1.676 m)   Wt 201 lb 9.6 oz (91.4 kg)   SpO2 99%   BMI 32.54 kg/m     Wt Readings from Last 3 Encounters:  08/06/19  201 lb 9.6 oz (91.4 kg)  05/07/19 196 lb 3.2 oz (89 kg)  04/03/19 198 lb (89.8 kg)     GEN: Well nourished, well developed in no acute distress HEENT: Normal NECK: No JVD; No carotid bruits LYMPHATICS: No lymphadenopathy CARDIAC: Grade 2/6 to 3/6 harsh ejection murmur to the carotids.  RRR, no murmurs, rubs, gallops RESPIRATORY:  Clear to auscultation without rales, wheezing or rhonchi  ABDOMEN: Soft, non-tender, non-distended MUSCULOSKELETAL: +1 bilateral lower extremity calcium channel blocker edema; No deformity  SKIN: Warm and dry NEUROLOGIC:  Alert and oriented x 3 PSYCHIATRIC:  Normal affect    Signed, Norman Herrlich, MD  08/06/2019 3:19 PM    [facility]

## 2019-08-06 NOTE — Patient Instructions (Signed)
Medication Instructions:  °Your physician recommends that you continue on your current medications as directed. Please refer to the Current Medication list given to you today. ° °*If you need a refill on your cardiac medications before your next appointment, please call your pharmacy* ° ° °Lab Work: °None °If you have labs (blood work) drawn today and your tests are completely normal, you will receive your results only by: °MyChart Message (if you have MyChart) OR °A paper copy in the mail °If you have any lab test that is abnormal or we need to change your treatment, we will call you to review the results. ° ° °Testing/Procedures: °Your physician has requested that you have an echocardiogram. Echocardiography is a painless test that uses sound waves to create images of your heart. It provides your doctor with information about the size and shape of your heart and how well your heart’s chambers and valves are working. This procedure takes approximately one hour. There are no restrictions for this procedure. ° °Your physician has requested that you have a carotid duplex. This test is an ultrasound of the carotid arteries in your neck. It looks at blood flow through these arteries that supply the brain with blood. Allow one hour for this exam. There are no restrictions or special instructions.  ° ° °Follow-Up: °At [facility], you and your health needs are our priority.  As part of our continuing mission to provide you with exceptional heart care, we have created designated Provider Care Teams.  These Care Teams include your primary Cardiologist (physician) and Advanced Practice Providers (APPs -  Physician Assistants and Nurse Practitioners) who all work together to provide you with the care you need, when you need it. ° °We recommend signing up for the patient portal called "MyChart".  Sign up information is provided on this After Visit Summary.  MyChart is used to connect with patients for Virtual Visits  (Telemedicine).  Patients are able to view lab/test results, encounter notes, upcoming appointments, etc.  Non-urgent messages can be sent to your provider as well.   °To learn more about what you can do with MyChart, go to [email or web address].   ° °Your next appointment:   °6 month(s) ° °The format for your next appointment:   °In Person ° °Provider:   °Brian Munley, MD  ° ° °Other Instructions ° ° °

## 2019-09-07 DIAGNOSIS — I878 Other specified disorders of veins: Secondary | ICD-10-CM

## 2019-09-07 HISTORY — DX: Other specified disorders of veins: I87.8

## 2020-01-28 DIAGNOSIS — I6529 Occlusion and stenosis of unspecified carotid artery: Secondary | ICD-10-CM | POA: Insufficient documentation

## 2020-01-31 ENCOUNTER — Ambulatory Visit (INDEPENDENT_AMBULATORY_CARE_PROVIDER_SITE_OTHER): Payer: Medicare HMO

## 2020-01-31 ENCOUNTER — Other Ambulatory Visit: Payer: Self-pay

## 2020-01-31 DIAGNOSIS — R011 Cardiac murmur, unspecified: Secondary | ICD-10-CM

## 2020-01-31 DIAGNOSIS — I6521 Occlusion and stenosis of right carotid artery: Secondary | ICD-10-CM | POA: Diagnosis not present

## 2020-01-31 LAB — ECHOCARDIOGRAM COMPLETE
AV Area VTI: 1.33 cm2
S' Lateral: 3.3 cm

## 2020-01-31 NOTE — Progress Notes (Signed)
Complete echocardiogram has been performed.  Jimmy Kenlynn Houde RDCS, RVT 

## 2020-01-31 NOTE — Progress Notes (Signed)
Carotid duplex exam performed  Jimmy Meloney Feld RDCS, RVT 

## 2020-02-06 NOTE — Progress Notes (Signed)
Cardiology Office Note:    Date:  02/07/2020   ID:  Matthew Mcneil, DOB [birth year], MRN [id number]  PCP:  Gordan Payment., MD  Cardiologist:  Norman Herrlich, MD    Referring MD: Gordan Payment., MD    ASSESSMENT:    1. Aortic stenosis, moderate   2. Coronary artery disease involving native coronary artery of native heart with angina pectoris (HCC)   3. Carotid stenosis, asymptomatic, right   4. Essential hypertension   5. Pure hypercholesterolemia    PLAN:    In order of problems listed above:  1. Stable recheck echocardiogram 1 year 2. Stable CAD continue medical therapy having no anginal discomfort at this time would not advise an ischemia evaluation 3. For carotid disease asymptomatic continue medical therapy antiplatelet lipid-lowering recheck 1 year if greater than 80% consider surgical revascularization 4. Continue current medical therapy 5. Substitute a low intensity statin short-term   Next appointment: 6 months   Medication Adjustments/Labs and Tests Ordered: Current medicines are reviewed at length with the patient today.  Concerns regarding medicines are outlined above.  No orders of the defined types were placed in this encounter.  No orders of the defined types were placed in this encounter.  Chief complaint, follow-up on PCSK9 inhibitor  History of Present Illness:    Matthew Mcneil is a 79 y.o. male with a hx of CAD with PCI 1989 and subsequent angiography in 2002 with chronic total occlusion carotid stenosis hypertension and hyperlipidemia last seen 08/06/2019.  Compliance with diet, lifestyle and medications: Yes  I reviewed his testing with him and his wife who is present participates in evaluation decision making.  Unfortunately the third-party who pays co-pay for his PCSK9 inhibitor already finds.  Regarding try low-dose of a low intensity statin in the interim. He is having no muscle pain or weakness and has had no angina shortness of  breath palpitation or syncope he is very vigorous and active doing garden work.  Echocardiogram performed 01/31/2020 shows EF 55 to 60% mild concentric LVH and normal diastolic filling pressures.  The left atrium is moderately enlarged aortic stenosis was present and described as moderate.  I independently reviewed the test peak and mean gradients were 40 and 22 mmHg with a normal stroke-volume index.  0.42 and valve area 1.33 cm in the valve was moderately calcified and restricted on short axis. Duplex 02/01/2020 showed 60 to 79% right ICA stenosis and 40 to 59% left ICA stenosis compared to the previous duplex 01/21/2019.Marland Kitchen  Most recent labs 09/27/2019: CMP normal potassium 4.1 sodium 144 creatinine is elevated 1.41 GFR 47 cc normal liver function test open 13 Past Medical History:  Diagnosis Date  . Anxiety 04/29/2016  . Benign prostate hyperplasia 04/28/2015  . Carotid artery occlusion   . 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 Chronic kidney disease, stage III (moderate) 04/28/2015  . Colon polyp 04/28/2015  . Coronary artery disease involving native coronary artery of native heart with angina pectoris (HCC) 05/06/2016  . Edema 04/28/2015  . Essential hypertension 04/28/2015  . High risk medication use 04/28/2015  . Hyperlipidemia 04/28/2015  . Hyperparathyroidism (HCC) 04/28/2015  . Impaired cognition 06/05/2016  . Malaise and fatigue 06/04/2016  . 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 Obesity 04/28/2015  . Osteoarthritis 04/28/2015  . Prediabetes 03/07/2017  . Screening for prostate cancer 09/08/2017   Chooses no further testing  . Vitamin B12 deficiency 04/28/2015    Past Surgical History:  Procedure Laterality  Date  . CARDIAC CATHETERIZATION    . CATARACT EXTRACTION, BILATERAL    . CORONARY ANGIOPLASTY    . HEMORROIDECTOMY      Current Medications: Current Meds  Medication Sig  . allopurinol (ZYLOPRIM) 100 MG tablet Take 1 tablet by mouth 2 (two) times  daily.  Marland Kitchen aspirin EC 81 MG tablet Take 1 tablet (81 mg total) by mouth daily.  Marland Kitchen escitalopram (LEXAPRO) 10 MG tablet Take 10 mg by mouth daily.  . Evolocumab (REPATHA SURECLICK) 140 MG/ML SOAJ Inject 140 mg into the skin every 14 (fourteen) days.  . finasteride (PROSCAR) 5 MG tablet Take 5 mg by mouth daily.  . furosemide (LASIX) 40 MG tablet Take 40 mg by mouth daily. Patient states that he takes 40 mg in the morning and 20 mg at night.  . latanoprost (XALATAN) 0.005 % ophthalmic solution Place 1 drop into both eyes daily.  . Multiple Vitamins-Minerals (PRESERVISION AREDS 2 PO) Take 1 tablet by mouth 2 (two) times daily.  . nitroGLYCERIN (NITROSTAT) 0.4 MG SL tablet Place 1 tablet (0.4 mg total) under the tongue every 5 (five) minutes as needed for chest pain.  Marland Kitchen olmesartan (BENICAR) 40 MG tablet Take 1 tablet by mouth daily.  . Omega-3 Fatty Acids (FISH OIL) 1000 MG CAPS Take 1 capsule by mouth 2 (two) times daily.     Allergies:   Spironolactone and Statins   Social History   Socioeconomic History  . Marital status: Married    Spouse name: Not on file  . Number of children: Not on file  . Years of education: Not on file  . Highest education level: Not on file  Occupational History  . Not on file  Tobacco Use  . Smoking status: Never Smoker  . Smokeless tobacco: Never Used  Vaping Use  . Vaping Use: Never used  Substance and Sexual Activity  . Alcohol use: Not Currently  . Drug use: Not Currently  . Sexual activity: Not Currently  Other Topics Concern  . Not on file  Social History Narrative  . Not on file   Social Determinants of Health   Financial Resource Strain:   . Difficulty of Paying Living Expenses: Not on file  Food Insecurity:   . Worried About Programme researcher, broadcasting/film/video in the Last Year: Not on file  . Ran Out of Food in the Last Year: Not on file  Transportation Needs:   . Lack of Transportation (Medical): Not on file  . Lack of Transportation (Non-Medical): Not  on file  Physical Activity:   . Days of Exercise per Week: Not on file  . Minutes of Exercise per Session: Not on file  Stress:   . Feeling of Stress : Not on file  Social Connections:   . Frequency of Communication with Friends and Family: Not on file  . Frequency of Social Gatherings with Friends and Family: Not on file  . Attends Religious Services: Not on file  . Active Member of Clubs or Organizations: Not on file  . Attends Banker Meetings: Not on file  . Marital Status: Not on file     Family History: The patient's family history includes Cancer in his mother; Diabetes in his brother and mother; Heart attack in his brother, father, and mother; Hypertension in his father and mother. ROS:   Please see the history of present illness.    All other systems reviewed and are negative.  EKGs/Labs/Other Studies Reviewed:    The following  studies were reviewed today:  EKG:  EKG ordered today and personally reviewed.  The ekg ordered today demonstrates sinus rhythm and is normal  Recent Labs: 04/03/2019: BUN 48; Creatinine, Ser 1.69; Hemoglobin 13.4; Platelets 147; Potassium 3.7; Sodium 139  Recent Lipid Panel    Component Value Date/Time   CHOL 114 01/21/2019 0953   TRIG 133 01/21/2019 0953   HDL 51 01/21/2019 0953   CHOLHDL 2.2 01/21/2019 0953   LDLCALC 40 01/21/2019 0953    Physical Exam:    VS:  Ht 5\' 6"  (1.676 m)   Wt 201 lb 6.4 oz (91.4 kg)   SpO2 99%   BMI 32.51 kg/m     Wt Readings from Last 3 Encounters:  02/07/20 201 lb 6.4 oz (91.4 kg)  08/06/19 201 lb 9.6 oz (91.4 kg)  05/07/19 196 lb 3.2 oz (89 kg)     GEN:  Well nourished, well developed in no acute distress HEENT: Normal NECK: No JVD; No carotid bruits LYMPHATICS: No lymphadenopathy CARDIAC: 3/6 murmur of aortic stenosis does not encompass S2 does not radiate to the carotids RRR, no murmurs, rubs, gallops RESPIRATORY:  Clear to auscultation without rales, wheezing or rhonchi  ABDOMEN:  Soft, non-tender, non-distended MUSCULOSKELETAL:  No edema; No deformity  SKIN: Warm and dry NEUROLOGIC:  Alert and oriented x 3 PSYCHIATRIC:  Normal affect    Signed, 05/09/19, MD  02/07/2020 8:30 AM    [facility]

## 2020-02-07 ENCOUNTER — Other Ambulatory Visit: Payer: Self-pay

## 2020-02-07 ENCOUNTER — Ambulatory Visit: Payer: Medicare HMO | Admitting: Cardiology

## 2020-02-07 ENCOUNTER — Encounter: Payer: Self-pay | Admitting: Cardiology

## 2020-02-07 VITALS — BP 174/76 | HR 77 | Ht 66.0 in | Wt 201.4 lb

## 2020-02-07 DIAGNOSIS — I10 Essential (primary) hypertension: Secondary | ICD-10-CM

## 2020-02-07 DIAGNOSIS — E7800 Pure hypercholesterolemia, unspecified: Secondary | ICD-10-CM | POA: Diagnosis not present

## 2020-02-07 DIAGNOSIS — I6521 Occlusion and stenosis of right carotid artery: Secondary | ICD-10-CM

## 2020-02-07 DIAGNOSIS — I25119 Atherosclerotic heart disease of native coronary artery with unspecified angina pectoris: Secondary | ICD-10-CM

## 2020-02-07 DIAGNOSIS — I350 Nonrheumatic aortic (valve) stenosis: Secondary | ICD-10-CM

## 2020-02-07 LAB — LIPID PANEL
Chol/HDL Ratio: 2.8 ratio (ref 0.0–5.0)
Cholesterol, Total: 121 mg/dL (ref 100–199)
HDL: 44 mg/dL (ref >39)
LDL Chol Calc (NIH): 43 mg/dL (ref 0–99)
Triglycerides: 215 mg/dL — ABNORMAL HIGH (ref 0–149)
VLDL Cholesterol Cal: 34 mg/dL (ref 5–40)

## 2020-02-07 LAB — COMPREHENSIVE METABOLIC PANEL
ALT: 24 IU/L (ref 0–44)
AST: 27 IU/L (ref 0–40)
Albumin/Globulin Ratio: 2 (ref 1.2–2.2)
Albumin: 4.7 g/dL (ref 3.7–4.7)
Alkaline Phosphatase: 102 IU/L (ref 44–121)
BUN/Creatinine Ratio: 24 (ref 10–24)
BUN: 36 mg/dL — ABNORMAL HIGH (ref 8–27)
Bilirubin Total: 0.3 mg/dL (ref 0.0–1.2)
CO2: 24 mmol/L (ref 20–29)
Calcium: 10.7 mg/dL — ABNORMAL HIGH (ref 8.6–10.2)
Chloride: 106 mmol/L (ref 96–106)
Creatinine, Ser: 1.53 mg/dL — ABNORMAL HIGH (ref 0.76–1.27)
GFR calc Af Amer: 49 mL/min/{1.73_m2} — ABNORMAL LOW (ref >59)
GFR calc non Af Amer: 42 mL/min/{1.73_m2} — ABNORMAL LOW (ref >59)
Globulin, Total: 2.3 g/dL (ref 1.5–4.5)
Glucose: 101 mg/dL — ABNORMAL HIGH (ref 65–99)
Potassium: 4.4 mmol/L (ref 3.5–5.2)
Sodium: 144 mmol/L (ref 134–144)
Total Protein: 7 g/dL (ref 6.0–8.5)

## 2020-02-07 MED ORDER — PRAVASTATIN SODIUM 20 MG PO TABS: 20.0000 mg | ORAL_TABLET | Freq: Every evening | ORAL | 3 refills | Status: DC

## 2020-02-07 NOTE — Patient Instructions (Signed)
Medication Instructions:  Your physician has recommended you make the following change in your medication:  START: Pravastatin 20 mg take one tablet by mouth daily.  *If you need a refill on your cardiac medications before your next appointment, please call your pharmacy*   Lab Work: Your physician recommends that you return for lab work in: TODAY CMP, Lipids If you have labs (blood work) drawn today and your tests are completely normal, you will receive your results only by: Marland Kitchen MyChart Message (if you have MyChart) OR . A paper copy in the mail If you have any lab test that is abnormal or we need to change your treatment, we will call you to review the results.   Testing/Procedures: None   Follow-Up: At [facility], you and your health needs are our priority.  As part of our continuing mission to provide you with exceptional heart care, we have created designated Provider Care Teams.  These Care Teams include your primary Cardiologist (physician) and Advanced Practice Providers (APPs -  Physician Assistants and Nurse Practitioners) who all work together to provide you with the care you need, when you need it.  We recommend signing up for the patient portal called "MyChart".  Sign up information is provided on this After Visit Summary.  MyChart is used to connect with patients for Virtual Visits (Telemedicine).  Patients are able to view lab/test results, encounter notes, upcoming appointments, etc.  Non-urgent messages can be sent to your provider as well.   To learn more about what you can do with MyChart, go to [email or web address].    Your next appointment:   6 month(s)  The format for your next appointment:   In Person  Provider:   Norman Herrlich, MD   Other Instructions

## 2020-02-22 IMAGING — CT CT ANGIO NECK
2 of 7 series · 8 of 33 positions shown · IV contrast (APPLIED)
Comparison: MRI brain 04/30/2006

CLINICAL DATA: Follow-up carotid stenosis. Abnormal auscultatory
exam.

EXAM:
CT ANGIOGRAPHY NECK
TECHNIQUE: Multidetector CT imaging of the neck was performed using the
standard protocol during bolus administration of intravenous
contrast. Multiplanar CT image reconstructions and MIPs were
obtained to evaluate the vascular anatomy. Carotid stenosis
measurements (when applicable) are obtained utilizing NASCET
criteria, using the distal internal carotid diameter as the
denominator.
CONTRAST:  80mL A8PE78-VLB IOPAMIDOL (A8PE78-VLB) INJECTION 76%

[Series 5: cta neck · axial · 0.45mm/px · z∈[-230,-156]mm · 2 of 111 slices shown]
[im 37/111  soft-tissue]
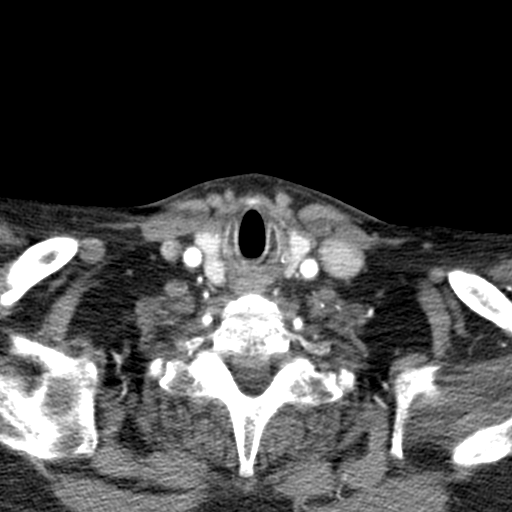
[im 74/111  soft-tissue]
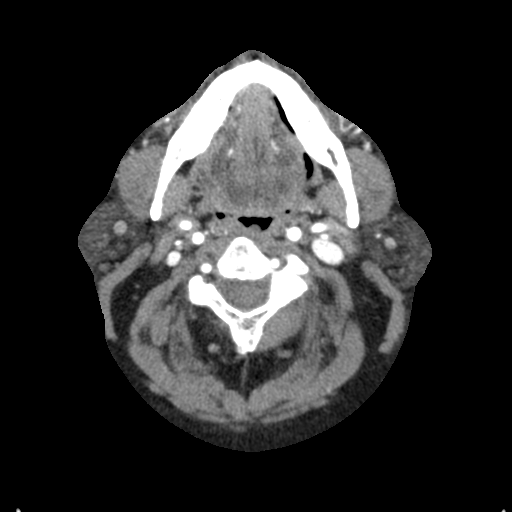

[Series 7: ax thin · axial · 0.44mm/px · z∈[-271,-114]mm · 6 of 221 slices shown]
[im 32/221  soft-tissue]
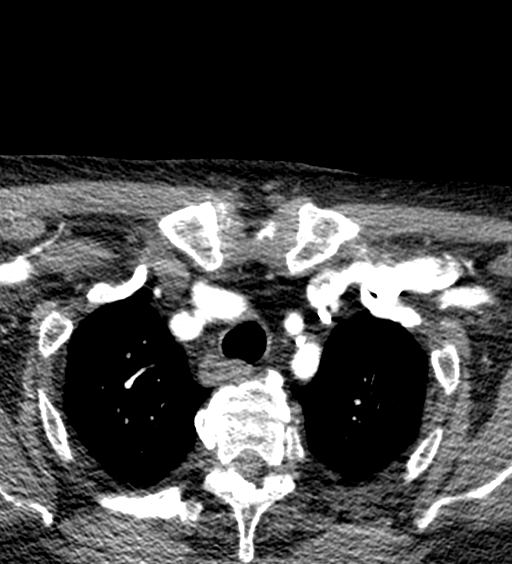
[im 63/221  bone]
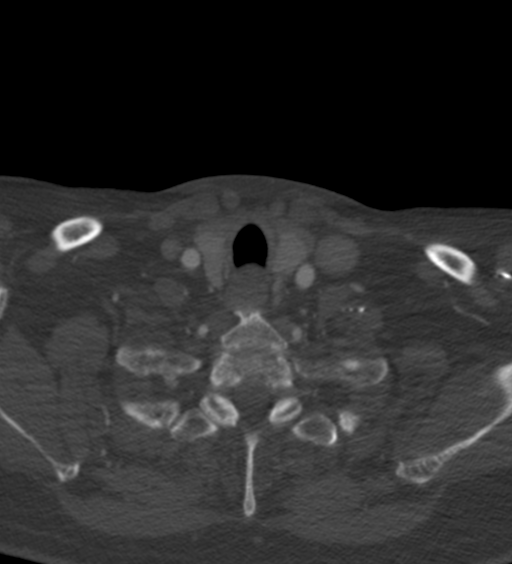
[im 95/221  soft-tissue]
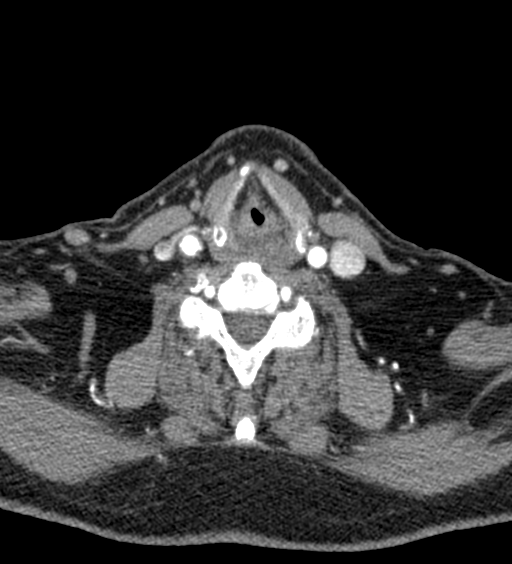
[im 126/221  bone]
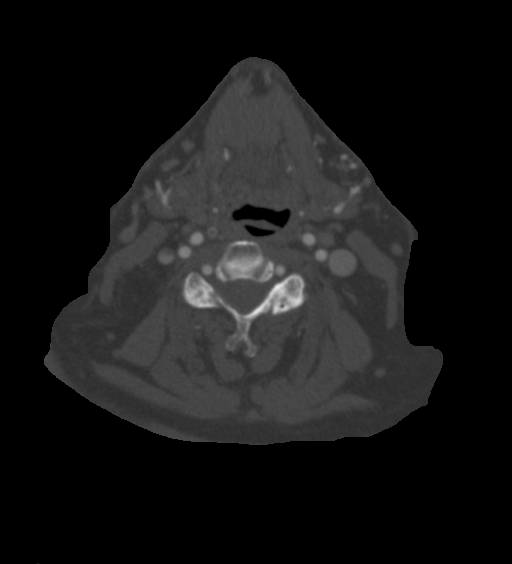
[im 158/221  soft-tissue]
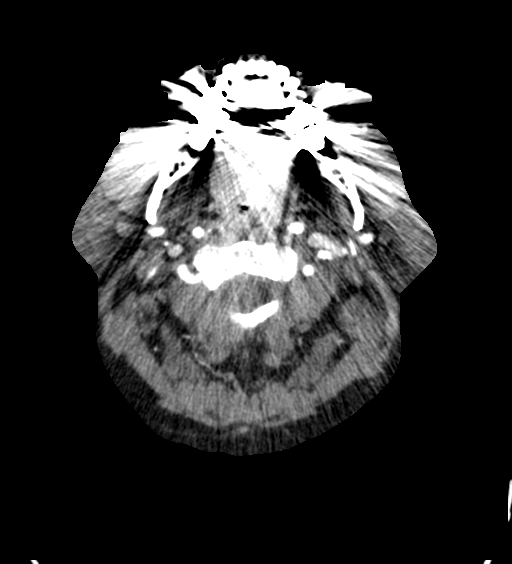
[im 189/221  bone]
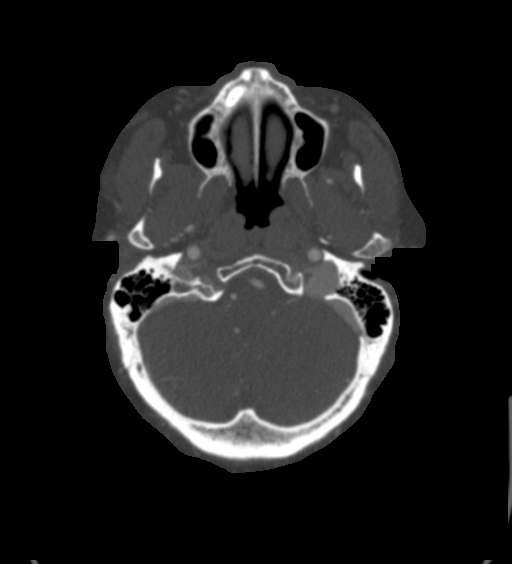

[8 of 33 positions shown; findings below may reference images not displayed]

FINDINGS: Aortic arch: Normal except for mild atherosclerotic change.

Right carotid system: Common carotid artery shows some soft and
calcified plaque distally without common carotid stenosis. At the
carotid bifurcation, there is advanced soft and calcified plaque
with severe stenosis. Proximal external carotid artery shows 50%
stenosis. Proximal internal carotid artery shows luminal diameter as
small as 1.5 mm. Compared to a more distal cervical ICA diameter of
6 mm, this indicates a 75% stenosis.

Left carotid system: Common carotid artery shows some
atherosclerotic plaque but no stenosis. At the carotid bifurcation
region, there is extensive soft and calcified plaque. 20% stenosis
of the proximal ECA. Minimal diameter of the proximal ICA is 3.5 mm.
Compared to a more distal cervical ICA diameter of 6 mm, this
indicates a 40% stenosis.

Vertebral arteries: No proximal subclavian stenosis. Calcified
plaque at the right vertebral artery origin with stenosis of 30-50%.
Beyond that, the right vertebral artery is widely patent to the
basilar. Left vertebral artery origin is widely patent. The vessel
is widely patent through the cervical region to the basilar.

Skeleton: Ordinary cervical spondylosis.

Other neck: No mass or lymphadenopathy.

Upper chest: Normal
IMPRESSION: Advanced atherosclerotic disease at the right carotid bifurcation
with soft and calcified plaque. 75% stenosis of the proximal right
ICA.

Moderate atherosclerotic disease at the left carotid bifurcation.
Soft and calcified plaque. 40% stenosis of the proximal left ICA.

30-50% stenosis of the right vertebral artery origin. Posterior
circulation otherwise normal in the neck.

## 2020-03-22 ENCOUNTER — Telehealth: Payer: Self-pay

## 2020-03-22 NOTE — Telephone Encounter (Signed)
PA started on CMM for Repatha. Key

## 2020-03-23 ENCOUNTER — Other Ambulatory Visit: Payer: Self-pay

## 2020-03-23 MED ORDER — REPATHA SURECLICK 140 MG/ML ~~LOC~~ SOAJ: 140.0000 mg | SUBCUTANEOUS | 1 refills | Status: DC

## 2020-03-24 NOTE — Telephone Encounter (Signed)
[employer] has approved coverage for the drug requested under Part D benefit for/through 03/10/2021. Your plan covers a recurring 30 or 90 day supply. Please note a recurring supply above 90 days is not covered.

## 2020-04-04 DIAGNOSIS — D638 Anemia in other chronic diseases classified elsewhere: Secondary | ICD-10-CM

## 2020-04-04 HISTORY — DX: Anemia in other chronic diseases classified elsewhere: D63.8

## 2020-08-07 NOTE — Progress Notes (Signed)
Cardiology Office Note:    Date:  08/08/2020   ID:  Matthew Mcneil, DOB Aug 11, 1940, MRN [id number]  PCP:  Matthew Mcneil., MD  Cardiologist:  Matthew Herrlich, MD    Referring MD: Matthew Mcneil., MD    ASSESSMENT:    1. Aortic stenosis, moderate   2. Coronary artery disease involving native coronary artery of native heart with angina pectoris (HCC)   3. Essential hypertension   4. Pure hypercholesterolemia   5. Stage 3 chronic kidney disease, unspecified whether stage 3a or 3b CKD (HCC)    PLAN:    In order of problems listed above:  1. Stable recheck echocardiogram in November he remains asymptomatic 2. Stable New York Heart Association class I he will continue current therapy including aspirin and lipid-lowering PCSK9 inhibitor. 3. At target blood pressures less than 130/80 continue his diuretic check renal function potassium and ARB 4. Continue Repatha check lipid profile 5. Recheck renal function   Next appointment: 6 months after echocardiogram   Medication Adjustments/Labs and Tests Ordered: Current medicines are reviewed at length with the patient today.  Concerns regarding medicines are outlined above.  No orders of the defined types were placed in this encounter.  No orders of the defined types were placed in this encounter.   No chief complaint on file.   History of Present Illness:    Matthew Mcneil is a 80 y.o. male with a hx of moderate aortic stenosis, CAD PCI in 1989 and subsequent coronary angiography in 2002 with chronic total occlusion, carotid stenosis hypertension and hyperlipidemia last seen 02/07/2020.  Echocardiogram 01/31/2020 shows mild concentric LVH EF 55 to 60% the left atrium is moderately enlarged aortic stenosis is present moderate in severity peak and mean gradients of 40/22 mmHg and valve area 1.33 cm.  Cerebrovascular duplex 02/01/2020 with 60 to 79% right ICA stenosis and 40 to 59% left ICA stenosis. Compliance with diet,  lifestyle and medications: Yes  Is limited by visual loss problem Matthew Mcneil remains quite active.  He has had no edema shortness of breath chest pain palpitation or syncope. He has had COVID once and will not accept vaccine. Past Medical History:  Diagnosis Date  . Anxiety 04/29/2016  . Benign prostate hyperplasia 04/28/2015  . Carotid artery occlusion   . 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 Chronic kidney disease, stage III (moderate) (HCC) 04/28/2015  . Colon polyp 04/28/2015  . Coronary artery disease involving native coronary artery of native heart with angina pectoris (HCC) 05/06/2016  . Edema 04/28/2015  . Essential hypertension 04/28/2015  . High risk medication use 04/28/2015  . Hyperlipidemia 04/28/2015  . Hyperparathyroidism (HCC) 04/28/2015  . Impaired cognition 06/05/2016  . Malaise and fatigue 06/04/2016  . 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 Obesity 04/28/2015  . Osteoarthritis 04/28/2015  . Prediabetes 03/07/2017  . Screening for prostate cancer 09/08/2017   Chooses no further testing  . Vitamin B12 deficiency 04/28/2015    Past Surgical History:  Procedure Laterality Date  . CARDIAC CATHETERIZATION    . CATARACT EXTRACTION, BILATERAL    . CORONARY ANGIOPLASTY    . HEMORROIDECTOMY      Current Medications: No outpatient medications have been marked as taking for the 08/08/20 encounter (Appointment) with Baldo Daub, MD.     Allergies:   Spironolactone and Statins   Social History   Socioeconomic History  . Marital status: Married    Spouse name: Not on file  . Number of children:  Not on file  . Years of education: Not on file  . Highest education level: Not on file  Occupational History  . Not on file  Tobacco Use  . Smoking status: Never Smoker  . Smokeless tobacco: Never Used  Vaping Use  . Vaping Use: Never used  Substance and Sexual Activity  . Alcohol use: Not Currently  . Drug use: Not Currently  . Sexual activity:  Not Currently  Other Topics Concern  . Not on file  Social History Narrative  . Not on file   Social Determinants of Health   Financial Resource Strain: Not on file  Food Insecurity: Not on file  Transportation Needs: Not on file  Physical Activity: Not on file  Stress: Not on file  Social Connections: Not on file     Family History: The patient's family history includes Cancer in his mother; Diabetes in his brother and mother; Heart attack in his brother, father, and mother; Hypertension in his father and mother. ROS:   Please see the history of present illness.    All other systems reviewed and are negative.  EKGs/Labs/Other Studies Reviewed:    The following studies were reviewed today:   Recent Labs: 02/07/2020: ALT 24; BUN 36; Creatinine, Ser 1.53; Potassium 4.4; Sodium 144  Recent Lipid Panel    Component Value Date/Time   CHOL 121 02/07/2020 0852   TRIG 215 (H) 02/07/2020 0852   HDL 44 02/07/2020 0852   CHOLHDL 2.8 02/07/2020 0852   LDLCALC 43 02/07/2020 0852    Physical Exam:    VS:  There were no vitals taken for this visit.    Wt Readings from Last 3 Encounters:  02/07/20 201 lb 6.4 oz (91.4 kg)  08/06/19 201 lb 9.6 oz (91.4 kg)  05/07/19 196 lb 3.2 oz (89 kg)     GEN:  Well nourished, well developed in no acute distress HEENT: Normal NECK: No JVD; No carotid bruits LYMPHATICS: No lymphadenopathy CARDIAC: 2/6 midsystolic murmur aortic area does not radiate to the carotids does not involve S2 no aortic regurgitation RRR, no  rubs, gallops RESPIRATORY:  Clear to auscultation without rales, wheezing or rhonchi  ABDOMEN: Soft, non-tender, non-distended MUSCULOSKELETAL:  No edema; No deformity  SKIN: Warm and dry NEUROLOGIC:  Alert and oriented x 3 PSYCHIATRIC:  Normal affect    Signed, Matthew Herrlich, MD  08/08/2020 7:38 AM    [facility]

## 2020-08-08 ENCOUNTER — Encounter: Payer: Self-pay | Admitting: Cardiology

## 2020-08-08 ENCOUNTER — Ambulatory Visit: Payer: Medicare HMO | Admitting: Cardiology

## 2020-08-08 ENCOUNTER — Other Ambulatory Visit: Payer: Self-pay

## 2020-08-08 VITALS — BP 144/70 | HR 96 | Ht 66.0 in | Wt 198.8 lb

## 2020-08-08 DIAGNOSIS — E7800 Pure hypercholesterolemia, unspecified: Secondary | ICD-10-CM | POA: Diagnosis not present

## 2020-08-08 DIAGNOSIS — N183 Chronic kidney disease, stage 3 unspecified: Secondary | ICD-10-CM

## 2020-08-08 DIAGNOSIS — I10 Essential (primary) hypertension: Secondary | ICD-10-CM

## 2020-08-08 DIAGNOSIS — I350 Nonrheumatic aortic (valve) stenosis: Secondary | ICD-10-CM

## 2020-08-08 DIAGNOSIS — I25119 Atherosclerotic heart disease of native coronary artery with unspecified angina pectoris: Secondary | ICD-10-CM | POA: Diagnosis not present

## 2020-08-08 LAB — LIPID PANEL
Chol/HDL Ratio: 2.5 ratio (ref 0.0–5.0)
Cholesterol, Total: 113 mg/dL (ref 100–199)
HDL: 46 mg/dL (ref >39)
LDL Chol Calc (NIH): 44 mg/dL (ref 0–99)
Triglycerides: 132 mg/dL (ref 0–149)
VLDL Cholesterol Cal: 23 mg/dL (ref 5–40)

## 2020-08-08 LAB — COMPREHENSIVE METABOLIC PANEL
ALT: 18 IU/L (ref 0–44)
AST: 20 IU/L (ref 0–40)
Albumin/Globulin Ratio: 2 (ref 1.2–2.2)
Albumin: 4.9 g/dL — ABNORMAL HIGH (ref 3.7–4.7)
Alkaline Phosphatase: 92 IU/L (ref 44–121)
BUN/Creatinine Ratio: 25 — ABNORMAL HIGH (ref 10–24)
BUN: 40 mg/dL — ABNORMAL HIGH (ref 8–27)
Bilirubin Total: 0.3 mg/dL (ref 0.0–1.2)
CO2: 24 mmol/L (ref 20–29)
Calcium: 10.8 mg/dL — ABNORMAL HIGH (ref 8.6–10.2)
Chloride: 105 mmol/L (ref 96–106)
Creatinine, Ser: 1.61 mg/dL — ABNORMAL HIGH (ref 0.76–1.27)
Globulin, Total: 2.4 g/dL (ref 1.5–4.5)
Glucose: 104 mg/dL — ABNORMAL HIGH (ref 65–99)
Potassium: 4.4 mmol/L (ref 3.5–5.2)
Sodium: 145 mmol/L — ABNORMAL HIGH (ref 134–144)
Total Protein: 7.3 g/dL (ref 6.0–8.5)
eGFR: 43 mL/min/{1.73_m2} — ABNORMAL LOW (ref >59)

## 2020-08-08 NOTE — Patient Instructions (Signed)
Medication Instructions:  Your physician recommends that you continue on your current medications as directed. Please refer to the Current Medication list given to you today.  *If you need a refill on your cardiac medications before your next appointment, please call your pharmacy*   Lab Work: Your physician recommends that you return for lab work in: TODAY CMP, Lipids If you have labs (blood work) drawn today and your tests are completely normal, you will receive your results only by: Marland Kitchen MyChart Message (if you have MyChart) OR . A paper copy in the mail If you have any lab test that is abnormal or we need to change your treatment, we will call you to review the results.   Testing/Procedures: Your physician has requested that you have an echocardiogram in November. Echocardiography is a painless test that uses sound waves to create images of your heart. It provides your doctor with information about the size and shape of your heart and how well your heart's chambers and valves are working. This procedure takes approximately one hour. There are no restrictions for this procedure.     Follow-Up: At [facility], you and your health needs are our priority.  As part of our continuing mission to provide you with exceptional heart care, we have created designated Provider Care Teams.  These Care Teams include your primary Cardiologist (physician) and Advanced Practice Providers (APPs -  Physician Assistants and Nurse Practitioners) who all work together to provide you with the care you need, when you need it.  We recommend signing up for the patient portal called "MyChart".  Sign up information is provided on this After Visit Summary.  MyChart is used to connect with patients for Virtual Visits (Telemedicine).  Patients are able to view lab/test results, encounter notes, upcoming appointments, etc.  Non-urgent messages can be sent to your provider as well.   To learn more about what you can do  with MyChart, go to [email or web address].    Your next appointment:   6 month(s)  The format for your next appointment:   In Person  Provider:   Norman Herrlich, MD   Other Instructions

## 2020-08-09 ENCOUNTER — Telehealth: Payer: Self-pay

## 2020-08-09 NOTE — Telephone Encounter (Signed)
-----   Message from Baldo Daub, MD sent at 08/09/2020  7:46 AM EDT ----- Stable results lipids are good no changes

## 2020-08-09 NOTE — Telephone Encounter (Signed)
Spoke with patient regarding results and recommendation.  Patient verbalizes understanding and is agreeable to plan of care. Advised patient to call back with any issues or concerns.  

## 2020-08-29 ENCOUNTER — Other Ambulatory Visit: Payer: Self-pay

## 2020-08-29 ENCOUNTER — Ambulatory Visit (INDEPENDENT_AMBULATORY_CARE_PROVIDER_SITE_OTHER): Payer: Medicare HMO

## 2020-08-29 DIAGNOSIS — E7800 Pure hypercholesterolemia, unspecified: Secondary | ICD-10-CM | POA: Diagnosis not present

## 2020-08-29 DIAGNOSIS — I350 Nonrheumatic aortic (valve) stenosis: Secondary | ICD-10-CM

## 2020-08-29 DIAGNOSIS — I10 Essential (primary) hypertension: Secondary | ICD-10-CM

## 2020-08-29 DIAGNOSIS — I25119 Atherosclerotic heart disease of native coronary artery with unspecified angina pectoris: Secondary | ICD-10-CM

## 2020-08-29 DIAGNOSIS — N183 Chronic kidney disease, stage 3 unspecified: Secondary | ICD-10-CM

## 2020-08-29 LAB — ECHOCARDIOGRAM COMPLETE
AR max vel: 1.09 cm2
AV Area VTI: 1.14 cm2
AV Area mean vel: 1.03 cm2
AV Mean grad: 28 mmHg
AV Peak grad: 51 mmHg
Ao pk vel: 3.57 m/s
Area-P 1/2: 4.49 cm2
Calc EF: 48.8 %
MV VTI: 2.4 cm2
S' Lateral: 3.8 cm
Single Plane A2C EF: 49.4 %
Single Plane A4C EF: 47.9 %

## 2020-08-29 NOTE — Progress Notes (Signed)
Complete echocardiogram performed.  Jimmy Basil Blakesley RDCS, RVT  

## 2020-08-30 ENCOUNTER — Telehealth: Payer: Self-pay

## 2020-08-30 NOTE — Telephone Encounter (Signed)
-----   Message from Kardie Tobb, DO sent at 08/30/2020 10:17 AM EDT ----- This is Dr. Munley's patient: The aortic valve stenosis has not worsened it is still moderate.  No significant change from your prior echocardiogram. 

## 2020-08-30 NOTE — Telephone Encounter (Signed)
Spoke with patient regarding results and recommendation.  Patient verbalizes understanding and is agreeable to plan of care. Advised patient to call back with any issues or concerns.  

## 2020-08-30 NOTE — Telephone Encounter (Signed)
Left message on patients voicemail to please return our call.   

## 2020-08-30 NOTE — Telephone Encounter (Signed)
-----   Message from Matthew Ripple, DO sent at 08/30/2020 10:17 AM EDT ----- This is Dr. Hulen Shouts patient: The aortic valve stenosis has not worsened it is still moderate.  No significant change from your prior echocardiogram.

## 2020-08-30 NOTE — Telephone Encounter (Signed)
Patient's wife returning call. 

## 2020-09-08 ENCOUNTER — Other Ambulatory Visit: Payer: Self-pay | Admitting: Cardiology

## 2020-12-31 NOTE — Progress Notes (Signed)
Cardiology Office Note:    Date:  01/01/2021   ID:  Matthew Mcneil, DOB 1940/06/02, MRN [id number]  PCP:  Gordan Payment., MD  Cardiologist:  Norman Herrlich, MD    Referring MD: Gordan Payment., MD    ASSESSMENT:    1. Benign hypertension with CKD (chronic kidney disease) stage III (HCC)   2. Aortic stenosis, moderate   3. Carotid stenosis, asymptomatic, right   4. Coronary artery disease involving native coronary artery of native heart with angina pectoris (HCC)   5. Pure hypercholesterolemia   6. Stage 3 chronic kidney disease, unspecified whether stage 3a or 3b CKD (HCC)    PLAN:    In order of problems listed above:  I am uncertain why today's blood pressure is still disproportionately elevated.  He has CKD other vascular disease is at risk for renal artery stenosis.  I gave him the choice to go to the emergency room for evaluation or treatment or filling a prescription for clonidine going home recheck his blood pressure greater 180 systolic taking a tablet.  I cautioned him to remain supine for 30 minutes afterwards we will recheck his renal function today no get a renal artery duplex done at the first opportunity.  He may require angiography has flow-limiting stenosis. Stable aortic stenosis we will plan on repeat echocardiogram in about 1 year Stable CAD having no angina on current treatment continue aspirin lipid-lowering with Repatha and antihypertensive agents Recheck renal function today check renal vascular duplex  Next appointment: 2 weeks   Medication Adjustments/Labs and Tests Ordered: Current medicines are reviewed at length with the patient today.  Concerns regarding medicines are outlined above.  Orders Placed This Encounter  Procedures   EKG 12-Lead    No orders of the defined types were placed in this encounter.   Chief Complaint  Patient presents with   Follow-up   Coronary Artery Disease    History of Present Illness:    Matthew Mcneil is a 80 y.o. male with a hx of CAD with PCI 1989 and subsequent coronary angiography in 2002 with chronic occlusion moderate aortic stenosis hypertension hyperlipidemia.  Last seen 08/08/2020.  Echocardiogram 01/31/2020 shows mild concentric LVH EF 55 to 60% the left atrium is moderately enlarged aortic stenosis is present moderate in severity peak and mean gradients of 40/22 mmHg and valve area 1.33 cm.  Cerebrovascular duplex 02/01/2020 with 60 to 79% right ICA stenosis and 40 to 59% left ICA stenosis.   Compliance with diet, lifestyle and medications: yes   To follow his aortic stenosis he had echocardiogram repeated 08/29/2020.  He continues to have moderate aortic stenosis with peak and mean gradients of 51/28 mmHg and valve area by VTI 1.14 sonometer squared.  Left ventricle ejection fraction low normal 50 to 55% with moderate concentric LVH elevated filling pressure.  Right ventricle function was normal along with pulmonary artery pressure left atrium is moderately enlarged.  Most recent labs [facility] PCP 10/16/2020: CMP sodium 147 potassium 5.1 creatinine 1.88 GFR 36 cc/min Cholesterol 110 triglycerides 167 LDL 46 HDL 46 non-HDL cholesterol 64 Hemoglobin 13.2 platelets 155,000  His blood pressure last visit with his primary clinic physician 130/80 10/16/2020 Initial blood pressure today 220/102 on the right and 221/105 on the left. Repeat blood pressure by me 208/90  He tells me he checks blood pressure at home and consistently runs 120s to 130s systolic.  He is compliant with his medication no change in his diet he tells  me that he is feels good He is having no cardiovascular symptoms of chest pain shortness of breath palpitation or syncope. He does have CKD Past Medical History:  Diagnosis Date   Anxiety 04/29/2016   Benign prostate hyperplasia 04/28/2015   Carotid artery occlusion    Chronic idiopathic gout 04/28/2015   Chronic kidney disease, stage III  (moderate) (HCC) 04/28/2015   Colon polyp 04/28/2015   Coronary artery disease involving native coronary artery of native heart with angina pectoris (HCC) 05/06/2016   Edema 04/28/2015   Essential hypertension 04/28/2015   High risk medication use 04/28/2015   Hyperlipidemia 04/28/2015   Hyperparathyroidism (HCC) 04/28/2015   Impaired cognition 06/05/2016   Malaise and fatigue 06/04/2016   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   Ascorbic Acid (VITAMIN C) 500 MG CAPS Take 1 capsule by mouth daily.   aspirin EC 81 MG tablet Take 1 tablet (81 mg total) by mouth daily.   escitalopram (LEXAPRO) 10 MG tablet Take 10 mg by mouth daily.   finasteride (PROSCAR) 5 MG tablet Take 5 mg by mouth daily.   furosemide (LASIX) 40 MG tablet Take 40 mg by mouth daily. Patient states that he takes 40 mg in the morning and 20 mg at night.   latanoprost (XALATAN) 0.005 % ophthalmic solution Place 1 drop into both eyes daily.   Multiple Vitamins-Minerals (PRESERVISION AREDS 2 PO) Take 1 tablet by mouth 2 (two) times daily.   nitroGLYCERIN (NITROSTAT) 0.4 MG SL tablet Place 1 tablet (0.4 mg total) under the tongue every 5 (five) minutes as needed for chest pain.   olmesartan (BENICAR) 40 MG tablet Take 1 tablet by mouth daily.   Omega-3 Fatty Acids (FISH OIL) 1000 MG CAPS Take 1 capsule by mouth 2 (two) times daily.   vitamin B-12 (CYANOCOBALAMIN) 500 MCG tablet Take 500 mcg by mouth every other day.    [DISCONTINUED] REPATHA SURECLICK 140 MG/ML SOAJ INJECT 140MG  INTO THE SKIN EVERY 14 DAYS     Allergies:   Spironolactone and Statins   Social History   Socioeconomic History   Marital status: Married    Spouse name: Not on file   Number of children: Not on file   Years of education: Not on file   Highest education level: Not on file  Occupational History   Not on file  Tobacco Use   Smoking status: Never   Smokeless tobacco: Never  Vaping Use   Vaping Use: Never used  Substance and Sexual Activity   Alcohol use: Not Currently   Drug use: Not Currently   Sexual activity: Not Currently  Other Topics Concern   Not on file  Social History Narrative   Not on file   Social Determinants of Health   Financial Resource Strain: Not on file  Food Insecurity: Not on file  Transportation Needs: Not on file  Physical Activity: Not on file  Stress: Not on file  Social Connections: Not on  file     Family History: The patient's family history includes Cancer in his mother; Diabetes in his brother and mother; Heart attack in his brother, father, and mother; Hypertension in his father and mother. ROS:   Please see the history of present illness.    All other systems reviewed and are negative.  EKGs/Labs/Other Studies Reviewed:    The following studies were reviewed today:  EKG:  EKG ordered today and personally reviewed.  The ekg ordered today demonstrates sinus rhythm 1 PVC and 1 APC otherwise normal  Recent Labs: 08/08/2020: ALT 18; BUN 40; Creatinine, Ser 1.61; Potassium 4.4; Sodium 145  Recent Lipid Panel    Component Value Date/Time   CHOL 113 08/08/2020 0836   TRIG 132 08/08/2020 0836   HDL 46 08/08/2020 0836   CHOLHDL 2.5 08/08/2020 0836   LDLCALC 44 08/08/2020 0836    Physical Exam:    VS:  BP (!) 204/88   Pulse 77   Ht 5\' 6"  (1.676 m)   Wt 198 lb (89.8 kg)   SpO2 98%   BMI 31.96 kg/m     Wt Readings from Last 3 Encounters:  01/01/21 198 lb (89.8 kg)   08/08/20 198 lb 12.8 oz (90.2 kg)  02/07/20 201 lb 6.4 oz (91.4 kg)     GEN:  Well nourished, well developed in no acute distress HEENT: Normal NECK: No JVD; No carotid bruits LYMPHATICS: No lymphadenopathy CARDIAC: RRR, no murmurs, rubs, gallops RESPIRATORY:  Clear to auscultation without rales, wheezing or rhonchi  ABDOMEN: Soft, non-tender, non-distended MUSCULOSKELETAL:  No edema; No deformity  SKIN: Warm and dry NEUROLOGIC:  Alert and oriented x 3 PSYCHIATRIC:  Normal affect    Signed, 02/09/20, MD  01/01/2021 10:53 AM    [facility]

## 2021-01-01 ENCOUNTER — Encounter: Payer: Self-pay | Admitting: Cardiology

## 2021-01-01 ENCOUNTER — Other Ambulatory Visit: Payer: Self-pay

## 2021-01-01 ENCOUNTER — Ambulatory Visit: Payer: Medicare HMO | Admitting: Cardiology

## 2021-01-01 ENCOUNTER — Telehealth: Payer: Self-pay

## 2021-01-01 ENCOUNTER — Inpatient Hospital Stay (HOSPITAL_COMMUNITY): Admission: RE | Admit: 2021-01-01 | Payer: Medicare HMO | Source: Ambulatory Visit

## 2021-01-01 VITALS — BP 204/88 | HR 77 | Ht 66.0 in | Wt 198.0 lb

## 2021-01-01 DIAGNOSIS — I350 Nonrheumatic aortic (valve) stenosis: Secondary | ICD-10-CM | POA: Diagnosis not present

## 2021-01-01 DIAGNOSIS — I6521 Occlusion and stenosis of right carotid artery: Secondary | ICD-10-CM | POA: Diagnosis not present

## 2021-01-01 DIAGNOSIS — N183 Chronic kidney disease, stage 3 unspecified: Secondary | ICD-10-CM

## 2021-01-01 DIAGNOSIS — I129 Hypertensive chronic kidney disease with stage 1 through stage 4 chronic kidney disease, or unspecified chronic kidney disease: Secondary | ICD-10-CM

## 2021-01-01 DIAGNOSIS — I25119 Atherosclerotic heart disease of native coronary artery with unspecified angina pectoris: Secondary | ICD-10-CM

## 2021-01-01 DIAGNOSIS — I10 Essential (primary) hypertension: Secondary | ICD-10-CM

## 2021-01-01 DIAGNOSIS — E7800 Pure hypercholesterolemia, unspecified: Secondary | ICD-10-CM

## 2021-01-01 LAB — BASIC METABOLIC PANEL
BUN/Creatinine Ratio: 18 (ref 10–24)
BUN: 28 mg/dL — ABNORMAL HIGH (ref 8–27)
CO2: 22 mmol/L (ref 20–29)
Calcium: 10.8 mg/dL — ABNORMAL HIGH (ref 8.6–10.2)
Chloride: 104 mmol/L (ref 96–106)
Creatinine, Ser: 1.52 mg/dL — ABNORMAL HIGH (ref 0.76–1.27)
Glucose: 89 mg/dL (ref 70–99)
Potassium: 4.2 mmol/L (ref 3.5–5.2)
Sodium: 144 mmol/L (ref 134–144)
eGFR: 46 mL/min/{1.73_m2} — ABNORMAL LOW (ref >59)

## 2021-01-01 MED ORDER — REPATHA SURECLICK 140 MG/ML ~~LOC~~ SOAJ: SUBCUTANEOUS | 5 refills | Status: DC

## 2021-01-01 MED ORDER — CLONIDINE HCL 0.1 MG PO TABS: 0.1000 mg | ORAL_TABLET | Freq: Every day | ORAL | 0 refills | Status: DC | PRN

## 2021-01-01 NOTE — Telephone Encounter (Signed)
PA submitted on CMM for Repatha. [facility]

## 2021-01-01 NOTE — Patient Instructions (Signed)
Medication Instructions:  Your physician has recommended you make the following change in your medication:  START: Clonidine 0.1 mg take one tablet by mouth daily if your blood pressure is >180 on top. Rest supine for 30 minutes after taking it. *If you need a refill on your cardiac medications before your next appointment, please call your pharmacy*   Lab Work: Your physician recommends that you return for lab work in: TODAY BMP If you have labs (blood work) drawn today and your tests are completely normal, you will receive your results only by: MyChart Message (if you have MyChart) OR A paper copy in the mail If you have any lab test that is abnormal or we need to change your treatment, we will call you to review the results.   Testing/Procedures: Your physician has requested that you have a renal artery duplex. During this test, an ultrasound is used to evaluate blood flow to the kidneys. Allow one hour for this exam. Do not eat after midnight the day before and avoid carbonated beverages. Take your medications as you usually do.    Follow-Up: At [facility], you and your health needs are our priority.  As part of our continuing mission to provide you with exceptional heart care, we have created designated Provider Care Teams.  These Care Teams include your primary Cardiologist (physician) and Advanced Practice Providers (APPs -  Physician Assistants and Nurse Practitioners) who all work together to provide you with the care you need, when you need it.  We recommend signing up for the patient portal called "MyChart".  Sign up information is provided on this After Visit Summary.  MyChart is used to connect with patients for Virtual Visits (Telemedicine).  Patients are able to view lab/test results, encounter notes, upcoming appointments, etc.  Non-urgent messages can be sent to your provider as well.   To learn more about what you can do with MyChart, go to [email or web address].     Your next appointment:   2 week(s)  The format for your next appointment:   In Person  Provider:   Norman Herrlich, MD   Other Instructions

## 2021-01-02 ENCOUNTER — Telehealth: Payer: Self-pay

## 2021-01-02 ENCOUNTER — Telehealth: Payer: Self-pay | Admitting: Cardiology

## 2021-01-02 ENCOUNTER — Encounter (HOSPITAL_COMMUNITY): Payer: Medicare HMO

## 2021-01-02 NOTE — Telephone Encounter (Signed)
Spoke with patient regarding results and recommendation.  Patient verbalizes understanding and is agreeable to plan of care. Advised patient to call back with any issues or concerns.  

## 2021-01-02 NOTE — Telephone Encounter (Signed)
-----   Message from Brian J Munley, MD sent at 01/02/2021  7:42 AM EDT ----- Normal or stable result  No changes 

## 2021-01-02 NOTE — Telephone Encounter (Signed)
Spoke to the patient and his wife just now and let them know Dr. Hulen Shouts recommendations. I also advised for them to keep this clonidine on hand in case his blood pressure were to be elevated like this again.    Encouraged patient to call back with any questions or concerns.

## 2021-01-02 NOTE — Telephone Encounter (Signed)
Pt wife states when he was in the office yesterday his blood pressure was high and they had asked him if he had taken anything over the counter and pt had told them no, but he had forgotten that he had taken two Zyrtec the night before.  Pt left the office and took cloNIDine like he was supposed to and laid down for 45 minutes as instructed. Blood pressure dropped to 114/66 and was dizzy, and "felt like crap" Pt states he never wants to take that again, and since he had in fact taken something over the counter, did he still need the [place] we ordered yesterday.  Please call wife at [phone]  Thank you!  Domingo Dimes

## 2021-01-03 ENCOUNTER — Ambulatory Visit (HOSPITAL_COMMUNITY)
Admission: RE | Admit: 2021-01-03 | Discharge: 2021-01-03 | Disposition: A | Discharge status: Home / Self Care | Payer: Medicare HMO | Source: Ambulatory Visit | Attending: Cardiology | Admitting: Cardiology

## 2021-01-03 ENCOUNTER — Other Ambulatory Visit: Payer: Self-pay

## 2021-01-03 DIAGNOSIS — N183 Chronic kidney disease, stage 3 unspecified: Secondary | ICD-10-CM | POA: Insufficient documentation

## 2021-01-03 DIAGNOSIS — I129 Hypertensive chronic kidney disease with stage 1 through stage 4 chronic kidney disease, or unspecified chronic kidney disease: Secondary | ICD-10-CM | POA: Diagnosis not present

## 2021-01-03 NOTE — Progress Notes (Signed)
Renal artery duplex completed. Refer to "CV Proc" under chart review to view preliminary results.  01/03/2021 8:48 AM Eula Fried., MHA, RVT, RDCS, RDMS

## 2021-01-04 NOTE — Telephone Encounter (Signed)
PA approved for Repatha from 03/11/20 to 03/10/22.

## 2021-01-12 ENCOUNTER — Other Ambulatory Visit: Payer: Self-pay

## 2021-01-16 ENCOUNTER — Encounter: Payer: Self-pay | Admitting: Cardiology

## 2021-01-16 ENCOUNTER — Other Ambulatory Visit: Payer: Self-pay

## 2021-01-16 ENCOUNTER — Ambulatory Visit: Payer: Medicare HMO | Admitting: Cardiology

## 2021-01-16 VITALS — BP 190/90 | HR 94 | Ht 66.0 in | Wt 198.8 lb

## 2021-01-16 DIAGNOSIS — E7800 Pure hypercholesterolemia, unspecified: Secondary | ICD-10-CM | POA: Diagnosis not present

## 2021-01-16 DIAGNOSIS — N183 Chronic kidney disease, stage 3 unspecified: Secondary | ICD-10-CM

## 2021-01-16 DIAGNOSIS — I129 Hypertensive chronic kidney disease with stage 1 through stage 4 chronic kidney disease, or unspecified chronic kidney disease: Secondary | ICD-10-CM | POA: Diagnosis not present

## 2021-01-16 DIAGNOSIS — I6523 Occlusion and stenosis of bilateral carotid arteries: Secondary | ICD-10-CM

## 2021-01-16 DIAGNOSIS — E782 Mixed hyperlipidemia: Secondary | ICD-10-CM

## 2021-01-16 DIAGNOSIS — I350 Nonrheumatic aortic (valve) stenosis: Secondary | ICD-10-CM | POA: Diagnosis not present

## 2021-01-16 DIAGNOSIS — Z789 Other specified health status: Secondary | ICD-10-CM

## 2021-01-16 NOTE — Patient Instructions (Signed)
Medication Instructions:  °Your physician recommends that you continue on your current medications as directed. Please refer to the Current Medication list given to you today. ° °*If you need a refill on your cardiac medications before your next appointment, please call your pharmacy* ° ° °Lab Work: °None °If you have labs (blood work) drawn today and your tests are completely normal, you will receive your results only by: °MyChart Message (if you have MyChart) OR °A paper copy in the mail °If you have any lab test that is abnormal or we need to change your treatment, we will call you to review the results. ° ° °Testing/Procedures: °Your physician has requested that you have an echocardiogram. Echocardiography is a painless test that uses sound waves to create images of your heart. It provides your doctor with information about the size and shape of your heart and how well your heart’s chambers and valves are working. This procedure takes approximately one hour. There are no restrictions for this procedure. ° °Your physician has requested that you have a carotid duplex. This test is an ultrasound of the carotid arteries in your neck. It looks at blood flow through these arteries that supply the brain with blood. Allow one hour for this exam. There are no restrictions or special instructions.  ° ° °Follow-Up: °At [facility], you and your health needs are our priority.  As part of our continuing mission to provide you with exceptional heart care, we have created designated Provider Care Teams.  These Care Teams include your primary Cardiologist (physician) and Advanced Practice Providers (APPs -  Physician Assistants and Nurse Practitioners) who all work together to provide you with the care you need, when you need it. ° °We recommend signing up for the patient portal called "MyChart".  Sign up information is provided on this After Visit Summary.  MyChart is used to connect with patients for Virtual Visits  (Telemedicine).  Patients are able to view lab/test results, encounter notes, upcoming appointments, etc.  Non-urgent messages can be sent to your provider as well.   °To learn more about what you can do with MyChart, go to [email or web address].   ° °Your next appointment:   °6 month(s) ° °The format for your next appointment:   °In Person ° °Provider:   °Brian Munley, MD  ° ° °Other Instructions ° ° °

## 2021-01-16 NOTE — Progress Notes (Signed)
Cardiology Office Note:    Date:  01/16/2021   ID:  [name], DOB [birth year], MRN [id number]  PCP:  Raina Mina., MD  Cardiologist:  Shirlee More, MD    Referring MD: Raina Mina., MD    ASSESSMENT:    1. Aortic stenosis, moderate   2. Benign hypertension with CKD (chronic kidney disease) stage III (HCC)   3. Pure hypercholesterolemia   4. Carotid stenosis, bilateral    PLAN:    In order of problems listed above:  Remains asymptomatic recheck his echocardiogram.  If he had severe symptomatic aortic stenosis would benefit.  Intervention especially TAVR. Fortunately checks home blood pressures good technique and range continue current treatment his Catapres and he can take as needed for hypertensive urgency avoid ED visits Statin intolerant on Repatha recheck labs including lipid profile Asymptomatic recheck carotid duplex if severe greater than 80% stenosis refer to vascular surgery for consideration of revascularization   Next appointment: 6 months   Medication Adjustments/Labs and Tests Ordered: Current medicines are reviewed at length with the patient today.  Concerns regarding medicines are outlined above.  No orders of the defined types were placed in this encounter.  No orders of the defined types were placed in this encounter.   Chief Complaint  Patient presents with   Follow-up   Aortic Stenosis  And hypertension  History of Present Illness:    Matthew Mcneil is a 80 y.o. male with a hx of hypertension with chronic CKD stage III poorly controlled moderate aortic stenosis CAD and hyperlipidemia last seen 12/30/2020.  His last echocardiogram November 2021showed mild concentric LVH EF 55 to 60% the left atrium is moderately enlarged aortic stenosis is present moderate in severity peak and mean gradients of 40/22 mmHg and valve area 1.33 cm.  Cerebrovascular duplex 02/01/2020 with 60 to 79% right ICA stenosis and 40 to 59% left ICA stenosis  .  Compliance with diet, lifestyle and consist of furosemide and ARB Benicar: Yes  Fortunately he trends his blood pressure at home his average is 130/70. He took clonidine on 1 occasion had an episode of near syncope after he stood up. He has had no edema shortness of breath chest pain palpitation or syncope. His son is present participates in evaluation decision.  He had a renal vascular duplex performed 01/03/2021 showing no findings of renal artery stenosis. Past Medical History:  Diagnosis Date   Abnormal ultrasound of carotid artery 01/23/2018   Anemia, chronic disease 04/04/2020   Anxiety 04/29/2016   Atherosclerosis of both carotid arteries 09/15/2018   Formatting of this note might be different from the original. Follows with [facility].   Benign prostate hyperplasia 04/28/2015   Carotid artery occlusion    Chronic idiopathic gout 04/28/2015   Chronic kidney disease, stage III (moderate) ([place]) 04/28/2015   Colon polyp 04/28/2015   Coronary artery disease involving native coronary artery of native heart with angina pectoris ([place]) 05/06/2016   Edema 04/28/2015   Essential hypertension 04/28/2015   High risk medication use 04/28/2015   History of COVID-19 05/20/2019   Formatting of this note might be different from the original. Late January 2021.   Hyperlipidemia 04/28/2015   Hyperparathyroidism ([place]) 04/28/2015   Impaired cognition 06/05/2016   Malaise and fatigue 06/04/2016   Mild episode of recurrent major depressive disorder ([place]) 05/16/2016   Nonrheumatic aortic valve stenosis 09/15/2018   Formatting of this note might be different from the original. Follows with [facility].   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 Follows with [facility].   Venous stasis  09/07/2019   Vitamin B12 deficiency 04/28/2015    Past Surgical History:  Procedure Laterality Date   CARDIAC CATHETERIZATION     CATARACT EXTRACTION, BILATERAL     CORONARY ANGIOPLASTY     HEMORROIDECTOMY      Current Medications: Current Meds  Medication Sig   allopurinol (ZYLOPRIM) 100 MG tablet Take 1 tablet by mouth 2 (two) times daily.   Ascorbic Acid (VITAMIN C) 500 MG CAPS Take 1 capsule by mouth daily.   aspirin EC 81 MG tablet Take 1 tablet (81 mg total) by mouth daily.   cloNIDine (CATAPRES) 0.1 MG tablet Take 0.1 mg by mouth as needed for high blood pressure. If b/p is over 180 on top   escitalopram (LEXAPRO) 10 MG tablet Take 10 mg by mouth daily.   Evolocumab (REPATHA SURECLICK) [id number] MG/ML SOAJ INJECT 140MG  INTO THE SKIN EVERY 14 DAYS   finasteride (PROSCAR) 5 MG tablet Take 5 mg by mouth daily.   furosemide (LASIX) 40 MG tablet Take 40 mg by mouth daily. Patient states that he takes 40 mg in the morning and 20 mg at night.   latanoprost (XALATAN) 0.005 % ophthalmic solution Place 1 drop into both eyes daily.   Multiple Vitamins-Minerals (PRESERVISION AREDS 2 PO) Take 1 tablet by mouth 2 (two) times daily.   nitroGLYCERIN (NITROSTAT) 0.4 MG SL tablet Place 1 tablet (0.4 mg total) under the tongue every 5 (five) minutes as needed for chest pain.   olmesartan (BENICAR) 40 MG tablet Take 1 tablet by mouth daily.   Omega-3 Fatty Acids (FISH OIL) 1000 MG CAPS Take 1 capsule by mouth 2 (two) times daily.   vitamin B-12 (CYANOCOBALAMIN) 500 MCG tablet Take 500 mcg by mouth every other day.     Allergies:   Spironolactone and Statins   Social History   Socioeconomic History   Marital status: Married    Spouse name: Not on file   Number of children: Not on file   Years of education: Not on file   Highest education level: Not on file  Occupational History   Not on file  Tobacco Use   Smoking status: Never   Smokeless tobacco: Never  Vaping Use   Vaping Use: Never used   Substance and Sexual Activity   Alcohol use: Not Currently   Drug use: Not Currently   Sexual activity: Not Currently  Other Topics Concern   Not on file  Social History Narrative   Not on file   Social Determinants of Health   Financial Resource Strain: Not on file  Food Insecurity: Not on file  Transportation Needs: Not on file  Physical Activity: Not on file  Stress: Not on file  Social Connections: Not on file     Family History: The patient's family history includes Cancer in his mother; Diabetes in his brother and mother; Heart attack in his brother, father, and mother; Hypertension in his father and mother. ROS:   Please see the history of present illness.    All other systems reviewed and are negative.  EKGs/Labs/Other Studies Reviewed:    The following studies were reviewed today:    Recent Labs: 08/08/2020: ALT 18 01/01/2021: BUN 28;  Creatinine, Ser 1.52; Potassium 4.2; Sodium 144  Recent Lipid Panel    Component Value Date/Time   CHOL 113 08/08/2020 0836   TRIG 132 08/08/2020 0836   HDL 46 08/08/2020 0836   CHOLHDL 2.5 08/08/2020 0836   LDLCALC 44 08/08/2020 0836    Physical Exam:    VS:  BP (!) 186/88   Pulse 94   Ht 5\' 6"  (1.676 m)   Wt 198 lb 12.8 oz (90.2 kg)   SpO2 97%   BMI 32.09 kg/m     Wt Readings from Last 3 Encounters:  01/16/21 198 lb 12.8 oz (90.2 kg)  01/01/21 198 lb (89.8 kg)  08/08/20 198 lb 12.8 oz (90.2 kg)     GEN:  Well nourished, well developed in no acute distress HEENT: Normal NECK: No JVD; No carotid bruits LYMPHATICS: No lymphadenopathy CARDIAC: Grade 2/6 AAS murmur aortic area does not radiate to the carotids S2 normal no aortic regurgitation RRR, no murmurs, rubs, gallops RESPIRATORY:  Clear to auscultation without rales, wheezing or rhonchi  ABDOMEN: Soft, non-tender, non-distended MUSCULOSKELETAL:  No edema; No deformity  SKIN: Warm and dry NEUROLOGIC:  Alert and oriented x 3 PSYCHIATRIC:  Normal affect     Signed, 08/10/20, MD  01/16/2021 8:40 AM    [facility]

## 2021-01-17 ENCOUNTER — Telehealth: Payer: Self-pay

## 2021-01-17 LAB — COMPREHENSIVE METABOLIC PANEL
ALT: 14 IU/L (ref 0–44)
AST: 20 IU/L (ref 0–40)
Albumin/Globulin Ratio: 2.2 (ref 1.2–2.2)
Albumin: 5.1 g/dL — ABNORMAL HIGH (ref 3.6–4.6)
Alkaline Phosphatase: 86 IU/L (ref 44–121)
BUN/Creatinine Ratio: 24 (ref 10–24)
BUN: 37 mg/dL — ABNORMAL HIGH (ref 8–27)
Bilirubin Total: 0.5 mg/dL (ref 0.0–1.2)
CO2: 24 mmol/L (ref 20–29)
Calcium: 10.4 mg/dL — ABNORMAL HIGH (ref 8.6–10.2)
Chloride: 107 mmol/L — ABNORMAL HIGH (ref 96–106)
Creatinine, Ser: 1.55 mg/dL — ABNORMAL HIGH (ref 0.76–1.27)
Globulin, Total: 2.3 g/dL (ref 1.5–4.5)
Glucose: 98 mg/dL (ref 70–99)
Potassium: 4.2 mmol/L (ref 3.5–5.2)
Sodium: 145 mmol/L — ABNORMAL HIGH (ref 134–144)
Total Protein: 7.4 g/dL (ref 6.0–8.5)
eGFR: 45 mL/min/{1.73_m2} — ABNORMAL LOW (ref >59)

## 2021-01-17 LAB — LIPID PANEL
Chol/HDL Ratio: 2.8 ratio (ref 0.0–5.0)
Cholesterol, Total: 121 mg/dL (ref 100–199)
HDL: 44 mg/dL (ref >39)
LDL Chol Calc (NIH): 48 mg/dL (ref 0–99)
Triglycerides: 172 mg/dL — ABNORMAL HIGH (ref 0–149)
VLDL Cholesterol Cal: 29 mg/dL (ref 5–40)

## 2021-01-17 NOTE — Telephone Encounter (Signed)
-----   Message from Baldo Daub, MD sent at 01/17/2021  8:16 AM EST ----- Normal or stable result  Good results no changes

## 2021-01-17 NOTE — Telephone Encounter (Signed)
Spoke with patient regarding results and recommendation.  Patient verbalizes understanding and is agreeable to plan of care. Advised patient to call back with any issues or concerns.  

## 2021-01-25 ENCOUNTER — Other Ambulatory Visit: Payer: Self-pay

## 2021-01-25 ENCOUNTER — Ambulatory Visit (INDEPENDENT_AMBULATORY_CARE_PROVIDER_SITE_OTHER): Payer: Medicare HMO

## 2021-01-25 DIAGNOSIS — I129 Hypertensive chronic kidney disease with stage 1 through stage 4 chronic kidney disease, or unspecified chronic kidney disease: Secondary | ICD-10-CM

## 2021-01-25 DIAGNOSIS — I350 Nonrheumatic aortic (valve) stenosis: Secondary | ICD-10-CM

## 2021-01-25 DIAGNOSIS — E7800 Pure hypercholesterolemia, unspecified: Secondary | ICD-10-CM

## 2021-01-25 DIAGNOSIS — I6523 Occlusion and stenosis of bilateral carotid arteries: Secondary | ICD-10-CM

## 2021-01-25 DIAGNOSIS — N183 Chronic kidney disease, stage 3 unspecified: Secondary | ICD-10-CM

## 2021-01-25 LAB — ECHOCARDIOGRAM COMPLETE
AR max vel: 1 cm2
AV Area VTI: 1.11 cm2
AV Area mean vel: 1.14 cm2
AV Mean grad: 21.2 mmHg
AV Peak grad: 41.1 mmHg
Ao pk vel: 3.2 m/s
Area-P 1/2: 4.74 cm2
S' Lateral: 4 cm

## 2021-01-26 ENCOUNTER — Telehealth: Payer: Self-pay

## 2021-01-26 DIAGNOSIS — I350 Nonrheumatic aortic (valve) stenosis: Secondary | ICD-10-CM

## 2021-01-26 DIAGNOSIS — I6523 Occlusion and stenosis of bilateral carotid arteries: Secondary | ICD-10-CM

## 2021-01-26 NOTE — Telephone Encounter (Signed)
Spoke with patient regarding results and recommendation.  Patient verbalizes understanding and is agreeable to plan of care. Advised patient to call back with any issues or concerns.  

## 2021-01-26 NOTE — Telephone Encounter (Signed)
-----   Message from Baldo Daub, MD sent at 01/25/2021  5:48 PM EST ----- Good result carotid narrowing is unchanged not severe we will consider repeat study in about 1 year he can put him in recall

## 2021-03-24 DIAGNOSIS — I493 Ventricular premature depolarization: Secondary | ICD-10-CM | POA: Diagnosis not present

## 2021-03-24 DIAGNOSIS — I350 Nonrheumatic aortic (valve) stenosis: Secondary | ICD-10-CM | POA: Diagnosis not present

## 2021-03-24 DIAGNOSIS — N189 Chronic kidney disease, unspecified: Secondary | ICD-10-CM | POA: Diagnosis not present

## 2021-03-24 DIAGNOSIS — I119 Hypertensive heart disease without heart failure: Secondary | ICD-10-CM | POA: Diagnosis not present

## 2021-03-24 DIAGNOSIS — K5720 Diverticulitis of large intestine with perforation and abscess without bleeding: Secondary | ICD-10-CM

## 2021-03-25 DIAGNOSIS — I493 Ventricular premature depolarization: Secondary | ICD-10-CM | POA: Diagnosis not present

## 2021-03-25 DIAGNOSIS — I119 Hypertensive heart disease without heart failure: Secondary | ICD-10-CM | POA: Diagnosis not present

## 2021-03-25 DIAGNOSIS — I350 Nonrheumatic aortic (valve) stenosis: Secondary | ICD-10-CM | POA: Diagnosis not present

## 2021-03-25 DIAGNOSIS — N189 Chronic kidney disease, unspecified: Secondary | ICD-10-CM | POA: Diagnosis not present

## 2021-04-26 IMAGING — DX DG CHEST 1V PORT
1 series · 1 of 1 positions shown · non-contrast
Comparison: April 16, 2006

CLINICAL DATA: Fatigue and cough.

EXAM:
PORTABLE CHEST 1 VIEW

[chest ap]
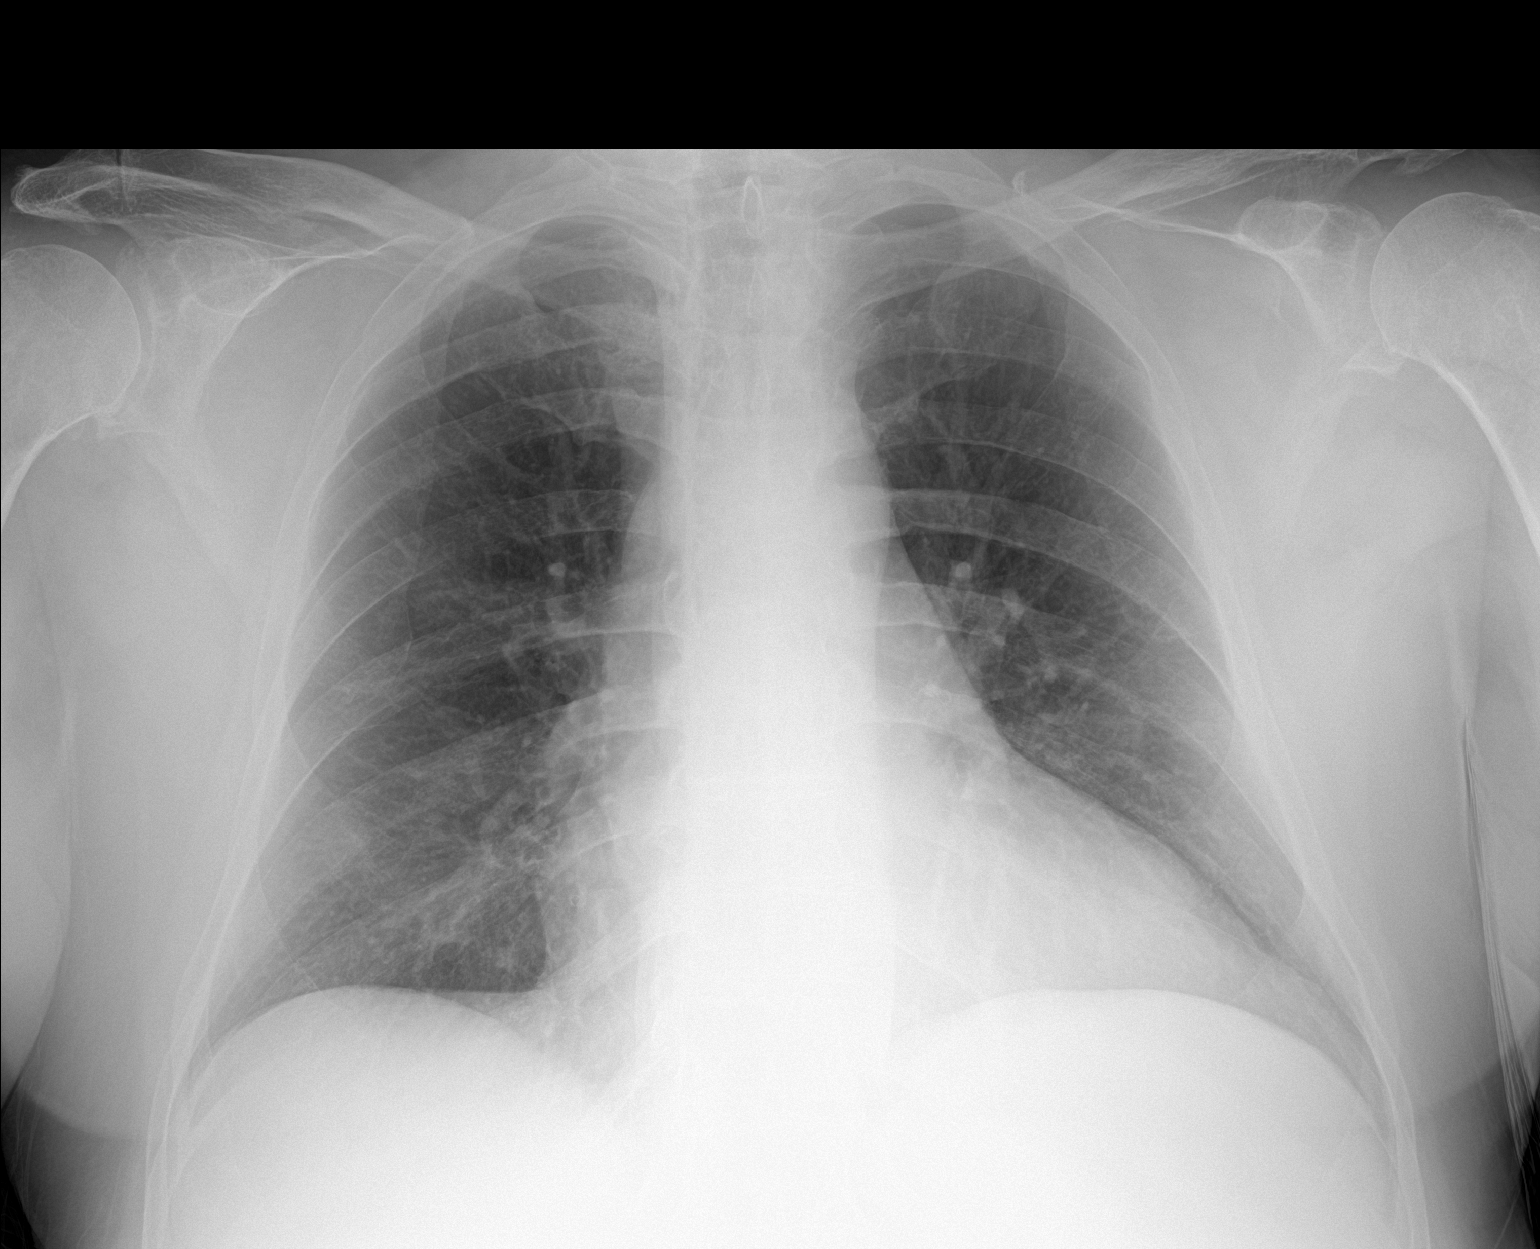

[1 of 1 positions shown; findings below may reference images not displayed]

FINDINGS: The heart size and mediastinal contours are within normal limits.
Both lungs are clear. The visualized skeletal structures are
unremarkable.
IMPRESSION: No active disease.

## 2021-05-30 ENCOUNTER — Telehealth: Payer: Self-pay | Admitting: Cardiology

## 2021-05-30 NOTE — Telephone Encounter (Signed)
?  Pt c/o medication issue: ? ?1. Name of Medication: Evolocumab (REPATHA SURECLICK) 140 MG/ML SOAJ ? ?2. How are you currently taking this medication (dosage and times per day)? INJECT 140MG  INTO THE SKIN EVERY 14 DAYS ? ?3. Are you having a reaction (difficulty breathing--STAT)?  ? ?4. What is your medication issue?Vic with , he said he got disconnected call with CMA [employer]. He is calling back to get the prior auth for this meds ? ?

## 2021-06-12 NOTE — Telephone Encounter (Signed)
Matthew Mcneil (Key: [id number]) ?Repatha SureClick 140MG /ML auto-injectors ?  ?Determination ?Message from Plan ?Authorization already on file for this request. Authorization starting on 03/12/2019 and ending on 03/10/2022. ?

## 2021-07-21 NOTE — Progress Notes (Deleted)
Cardiology Office Note:    Date:  07/21/2021   ID:  Matthew Mcneil, DOB [birth year], MRN [id number]  PCP:  Raina Mina., MD  Cardiologist:  Shirlee More, MD    Referring MD: Raina Mina., MD    ASSESSMENT:    No diagnosis found. PLAN:    In order of problems listed above:  ***   Next appointment: ***   Medication Adjustments/Labs and Tests Ordered: Current medicines are reviewed at length with the patient today.  Concerns regarding medicines are outlined above.  No orders of the defined types were placed in this encounter.  No orders of the defined types were placed in this encounter.   No chief complaint on file.   History of Present Illness:    Matthew Mcneil is a 81 y.o. male with a hx of hypertension stage III CKD moderate aortic stenosis hyperlipidemia and CAD as well as carotid disease with 60 to 79% right internal carotid stenosis 40 to 59% left internal carotid stenosis November 2021.  He was last seen 01/16/2021 tolerating lipid-lowering therapy with Repatha with his hypertension controlled and having no symptoms related to his aortic stenosis.. Compliance with diet, lifestyle and medications: ***  Had a follow-up echocardiogram 01/25/2021 showing moderate aortic stenosis peak and mean gradients 4121 mmHg valve area 1.1 cm and a VTI ratio of 0.35.  1. Left ventricular ejection fraction, by estimation, is 50 to 55%. The  left ventricle has low normal function. Left ventricular endocardial  border not optimally defined to evaluate regional wall motion. There is  moderate concentric left ventricular  hypertrophy. Left ventricular diastolic parameters are consistent with  Grade I diastolic dysfunction (impaired relaxation). Elevated left  ventricular end-diastolic pressure.   2. Right ventricular systolic function is normal. The right ventricular  size is normal. There is mildly elevated pulmonary artery systolic  pressure.   3. The mitral valve  is normal in structure. Trivial mitral valve  regurgitation. No evidence of mitral stenosis.   4. The aortic valve has an indeterminant number of cusps. There is  moderate calcification of the aortic valve. Aortic valve regurgitation is  trivial. Moderate aortic valve stenosis.   5. There is mild dilatation of the ascending aorta, measuring 38 mm.   6. The inferior vena cava is normal in size with greater than 50%  respiratory variability, suggesting right atrial pressure of 3 mmHg.  Vascular ultrasound carotids 01/25/2021  showed stable stenosis 60 to 79% right ICA 40 to 59% left ICA with normal bilateral vertebral flow Summary:  Right Carotid: Velocities in the right ICA are consistent with a 60-79%                 stenosis.   Left Carotid: Velocities in the left ICA are consistent with a 40-59%  stenosis.   Vertebrals:  Bilateral vertebral arteries demonstrate antegrade flow.  Subclavians: Normal flow hemodynamics were seen in bilateral subclavian               arteries.  Past Medical History:  Diagnosis Date   Abnormal ultrasound of carotid artery 01/23/2018   Anemia, chronic disease 04/04/2020   Anxiety 04/29/2016   Atherosclerosis of both carotid arteries 09/15/2018   Formatting of this note might be different from the original. Follows with [facility].   Benign prostate hyperplasia 04/28/2015   Carotid artery occlusion    Chronic idiopathic gout 04/28/2015   Chronic kidney disease, stage III (moderate) (HCC) 04/28/2015   Colon polyp 04/28/2015   Coronary  artery disease involving native coronary artery of native heart with angina pectoris (HCC) 05/06/2016   Edema 04/28/2015   Essential hypertension 04/28/2015   High risk medication use 04/28/2015   History of COVID-19 05/20/2019   Formatting of this note might be different from the original. Late January 2021.   Hyperlipidemia 04/28/2015   Hyperparathyroidism (HCC) 04/28/2015   Impaired cognition 06/05/2016   Malaise and fatigue 06/04/2016    Mild episode of recurrent major depressive disorder (HCC) 05/16/2016   Nonrheumatic aortic valve stenosis 09/15/2018   Formatting of this note might be different from the original. Follows with [facility].   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 Follows with [facility].   Venous stasis 09/07/2019   Vitamin B12 deficiency 04/28/2015    Past Surgical History:  Procedure Laterality Date   CARDIAC CATHETERIZATION     CATARACT EXTRACTION, BILATERAL     CORONARY ANGIOPLASTY     HEMORROIDECTOMY      Current Medications: No outpatient medications have been marked as taking for the 07/23/21 encounter (Appointment) with Baldo Daub, MD.     Allergies:   Spironolactone and Statins   Social History   Socioeconomic History   Marital status: Married    Spouse name: Not on file   Number of children: Not on file   Years of education: Not on file   Highest education level: Not on file  Occupational History   Not on file  Tobacco Use   Smoking status: Never   Smokeless tobacco: Never  Vaping Use   Vaping Use: Never used  Substance and Sexual Activity   Alcohol use: Not Currently   Drug use: Not Currently   Sexual activity: Not Currently  Other Topics Concern   Not on file  Social History Narrative   Not on file   Social Determinants of Health   Financial Resource Strain: Not on file  Food Insecurity: Not on file  Transportation Needs: Not on file  Physical Activity: Not on file  Stress: Not on file  Social Connections: Not on file     Family History: The patient's ***family history includes Cancer in his mother; Diabetes in his brother and mother; Heart attack in his brother, father, and mother; Hypertension in his father and mother. ROS:    Please see the history of present illness.    All other systems reviewed and are negative.  EKGs/Labs/Other Studies Reviewed:    The following studies were reviewed today:  EKG:  EKG ordered today and personally reviewed.  The ekg ordered today demonstrates ***  Recent Labs: 01/16/2021: ALT 14; BUN 37; Creatinine, Ser 1.55; Potassium 4.2; Sodium 145  Recent Lipid Panel    Component Value Date/Time   CHOL 121 01/16/2021 0857   TRIG 172 (H) 01/16/2021 0857   HDL 44 01/16/2021 0857   CHOLHDL 2.8 01/16/2021 0857   LDLCALC 48 01/16/2021 0857    Physical Exam:    VS:  There were no vitals taken for this visit.    Wt Readings from Last 3 Encounters:  01/16/21 198 lb 12.8 oz (90.2 kg)  01/01/21 198 lb (89.8 kg)  08/08/20 198 lb 12.8 oz (90.2 kg)     GEN: *** Well nourished, well developed in no acute distress HEENT:  Normal NECK: No JVD; No carotid bruits LYMPHATICS: No lymphadenopathy CARDIAC: ***RRR, no murmurs, rubs, gallops RESPIRATORY:  Clear to auscultation without rales, wheezing or rhonchi  ABDOMEN: Soft, non-tender, non-distended MUSCULOSKELETAL:  No edema; No deformity  SKIN: Warm and dry NEUROLOGIC:  Alert and oriented x 3 PSYCHIATRIC:  Normal affect    Signed, Shirlee More, MD  07/21/2021 10:33 AM    [place]

## 2021-07-23 ENCOUNTER — Ambulatory Visit: Payer: Medicare HMO | Admitting: Cardiology

## 2021-07-23 DIAGNOSIS — I350 Nonrheumatic aortic (valve) stenosis: Secondary | ICD-10-CM

## 2021-07-23 DIAGNOSIS — Z789 Other specified health status: Secondary | ICD-10-CM

## 2021-07-23 DIAGNOSIS — I6523 Occlusion and stenosis of bilateral carotid arteries: Secondary | ICD-10-CM

## 2021-07-23 DIAGNOSIS — E782 Mixed hyperlipidemia: Secondary | ICD-10-CM

## 2021-07-23 DIAGNOSIS — N183 Chronic kidney disease, stage 3 unspecified: Secondary | ICD-10-CM

## 2021-07-23 DIAGNOSIS — I129 Hypertensive chronic kidney disease with stage 1 through stage 4 chronic kidney disease, or unspecified chronic kidney disease: Secondary | ICD-10-CM

## 2021-08-10 ENCOUNTER — Other Ambulatory Visit: Payer: Self-pay | Admitting: Cardiology

## 2021-12-25 ENCOUNTER — Other Ambulatory Visit: Payer: Medicare HMO

## 2022-01-08 ENCOUNTER — Ambulatory Visit (INDEPENDENT_AMBULATORY_CARE_PROVIDER_SITE_OTHER): Payer: Medicare HMO

## 2022-01-08 ENCOUNTER — Ambulatory Visit: Payer: Medicare HMO | Attending: Cardiology

## 2022-01-08 DIAGNOSIS — I6523 Occlusion and stenosis of bilateral carotid arteries: Secondary | ICD-10-CM | POA: Diagnosis not present

## 2022-01-08 DIAGNOSIS — I350 Nonrheumatic aortic (valve) stenosis: Secondary | ICD-10-CM

## 2022-01-08 LAB — ECHOCARDIOGRAM COMPLETE
AR max vel: 0.68 cm2
AV Area VTI: 0.74 cm2
AV Area mean vel: 0.69 cm2
AV Mean grad: 33.5 mmHg
AV Peak grad: 63.8 mmHg
Ao pk vel: 4 m/s
Area-P 1/2: 4.17 cm2
Calc EF: 48.6 %
P 1/2 time: 358 msec
S' Lateral: 4.4 cm
Single Plane A2C EF: 46.1 %
Single Plane A4C EF: 49 %

## 2022-01-24 ENCOUNTER — Other Ambulatory Visit: Payer: Self-pay | Admitting: Cardiology

## 2022-02-12 NOTE — Progress Notes (Unsigned)
Cardiology Office Note:    Date:  02/13/2022   ID:  Matthew Mcneil, DOB 12/26/1940, MRN [id number]  PCP:  Grisso, Greg A., MD  Cardiologist:  Yuktha Kerchner, MD    Referring MD: Grisso, Greg A., MD    ASSESSMENT:    1. Nonrheumatic aortic valve stenosis   2. Benign hypertension with CKD (chronic kidney disease) stage III (HCC)   3. Carotid stenosis, bilateral   4. Pure hypercholesterolemia    PLAN:    In order of problems listed above:  Although he is minimally symptomatic he has developed severe aortic stenosis and has mild LV dysfunction and clearly needs to undergo consideration for intervention I think he be best served by TAVR we will go ahead and schedule left and right heart catheterization and cautiously admitted given the small dose of a calcium channel blocker for additional antihypertensive therapy.  He does have renal dysfunction. Poorly controlled hypertension stable CKD I am concerned there is recent edema and increased dose of diuretic as a reflection of early heart failure with his aortic stenosis Stable carotid disease Lipids are well-controlled continue Repatha   Next appointment: 6 weeks   Medication Adjustments/Labs and Tests Ordered: Current medicines are reviewed at length with the patient today.  Concerns regarding medicines are outlined above.  Orders Placed This Encounter  Procedures   Basic Metabolic Panel (BMET)   CBC   Pro b natriuretic peptide   EKG 12-Lead   No orders of the defined types were placed in this encounter.   Chief Complaint  Patient presents with   Follow-up   Aortic Stenosis    History of Present Illness:    Matthew Mcneil is a 81 y.o. male with a hx of aortic stenosis hypertension CKD CAD and hyperlipidemia last seen 01/16/2021.  Compliance with diet, lifestyle and medications: Yes  His wife is present participates in evaluation decision making Home blood pressure runs in the range of 140 systolic Is very  hypertensive today in my office which may play a role in his LV dysfunction on the echocardiogram Subtle he has had a change in his exercise tolerance when he does heavy outdoor work he has to stop and rest which she did not need to do a year ago He is not having edema orthopnea chest pain palpitation or syncope but he did have an increase in his distal diuretic by his PCP for peripheral edema recently Regardless he has LV dysfunction severe aortic stenosis and I have after reviewing options risk and benefits will undergo left and right heart catheterization for consideration of TAVR. I am reluctant to abruptly change his blood pressure with severe aortic stenosis and I will place him on a small dose of amlodipine which has a slow onset and continue to trend his blood pressure at home and continue his loop diuretic and his ARB. Recent labs from his PCP 10/31/2021 Creatinine 1.52 potassium 4.7 GFR 46 cc Cholesterol 110 LDL 52 triglycerides 132 HDL 44 Hemoglobin 12.7 platelets 173,000 His echocardiogram 01/08/2022 shows aortic valve to be tricuspid calcified restricted mild regurgitation and severe aortic stenosis with peak velocity of 4 m/s squared valve area 0.74 cm VTI ratio 0.24.  His ejection fraction was mildly reduced 45 to 50% normal right ventricular size function small pericardial effusion mild mitral regurgitation. Carotid duplex 01/08/2022 showed right ICA stenosis 60 to 79% left ICA stenosis 40 to 59%. Past Medical History:  Diagnosis Date   Abnormal ultrasound of carotid artery 01/23/2018   Anemia, chronic   disease 04/04/2020   Anxiety 04/29/2016   Atherosclerosis of both carotid arteries 09/15/2018   Formatting of this note might be different from the original. Follows with Shekira Drummer.   Benign prostate hyperplasia 04/28/2015   Carotid artery occlusion    Chronic idiopathic gout 04/28/2015   Chronic kidney disease, stage III (moderate) (HCC) 04/28/2015   Colon polyp 04/28/2015   Coronary  artery disease involving native coronary artery of native heart with angina pectoris (HCC) 05/06/2016   Edema 04/28/2015   Essential hypertension 04/28/2015   High risk medication use 04/28/2015   History of COVID-19 05/20/2019   Formatting of this note might be different from the original. Late January 2021.   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 Follows with [name].   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 Follows with [name].   Venous stasis 09/07/2019   Vitamin B12 deficiency 04/28/2015    Past Surgical History:  Procedure Laterality Date   CARDIAC CATHETERIZATION     CATARACT EXTRACTION, BILATERAL     CORONARY ANGIOPLASTY     HEMORROIDECTOMY      Current Medications: Current Meds  Medication Sig   allopurinol (ZYLOPRIM) 100 MG tablet Take 1 tablet by mouth 2 (two) times daily.   Ascorbic Acid (VITAMIN C) 500 MG CAPS Take 1 capsule by mouth daily.   aspirin EC 81 MG tablet Take 1 tablet (81 mg total) by mouth daily.   escitalopram (LEXAPRO) 10 MG tablet Take 10 mg by mouth daily.   Evolocumab (REPATHA SURECLICK) 140 MG/ML SOAJ Inject 140 mg into the skin every 14 (fourteen) days. Keep appointment for 02/13/22 for further refills. 1 st attempt   finasteride (PROSCAR) 5 MG tablet Take 5 mg by mouth daily.   furosemide (LASIX) 40 MG tablet Take 40 mg by mouth daily. Patient states that he takes 40 mg in the morning and 20 mg at night.   latanoprost (XALATAN) 0.005 % ophthalmic  solution Place 1 drop into both eyes daily.   Multiple Vitamins-Minerals (PRESERVISION AREDS 2 PO) Take 1 tablet by mouth 2 (two) times daily.   nitroGLYCERIN (NITROSTAT) 0.4 MG SL tablet Place 1 tablet (0.4 mg total) under the tongue every 5 (five) minutes as needed for chest pain.   olmesartan (BENICAR) 40 MG tablet Take 1 tablet by mouth daily.   Omega-3 Fatty Acids (FISH OIL) 1000 MG CAPS Take 1 capsule by mouth 2 (two) times daily.   vitamin B-12 (CYANOCOBALAMIN) 500 MCG tablet Take 500 mcg by mouth every other day.     Allergies:   Spironolactone and Statins   Social History   Socioeconomic History   Marital status: Married    Spouse name: Not on file   Number of children: Not on file   Years of education: Not on file   Highest education level: Not on file  Occupational History   Not on file  Tobacco Use   Smoking status: Never   Smokeless tobacco: Never  Vaping Use   Vaping Use: Never used    Substance and Sexual Activity   Alcohol use: Not Currently   Drug use: Not Currently   Sexual activity: Not Currently  Other Topics Concern   Not on file  Social History Narrative   Not on file   Social Determinants of Health   Financial Resource Strain: Not on file  Food Insecurity: Not on file  Transportation Needs: Not on file  Physical Activity: Not on file  Stress: Not on file  Social Connections: Not on file     Family History: The patient's family history includes Cancer in his mother; Diabetes in his brother and mother; Heart attack in his brother, father, and mother; Hypertension in his father and mother. ROS:   Please see the history of present illness.    All other systems reviewed and are negative.  EKGs/Labs/Other Studies Reviewed:    The following studies were reviewed today:    Recent Labs: No results found for requested labs within last 365 days.  Recent Lipid Panel    Component Value Date/Time   CHOL 121 01/16/2021 0857   TRIG 172 (H) 01/16/2021  0857   HDL 44 01/16/2021 0857   CHOLHDL 2.8 01/16/2021 0857   LDLCALC 48 01/16/2021 0857    Physical Exam:    VS:  BP (!) 190/86   Pulse 67   Ht 5\' 6"  (1.676 m)   Wt 188 lb (85.3 kg)   SpO2 97%   BMI 30.34 kg/m     Wt Readings from Last 3 Encounters:  02/13/22 188 lb (85.3 kg)  01/16/21 198 lb 12.8 oz (90.2 kg)  01/01/21 198 lb (89.8 kg)     GEN:  Well nourished, well developed in no acute distress HEENT: Normal NECK: No JVD; No carotid bruits upstroke is diminished transmitted murmur present LYMPHATICS: No lymphadenopathy CARDIAC: Grade 3/6 to 4/6 holosystolic high-pitched murmur aortic stenosis encompasses S2 and radiates to the carotids no AR RRR, no murmurs, rubs, gallops RESPIRATORY:  Clear to auscultation without rales, wheezing or rhonchi  ABDOMEN: Soft, non-tender, non-distended MUSCULOSKELETAL:  No edema; No deformity  SKIN: Warm and dry NEUROLOGIC:  Alert and oriented x 3 PSYCHIATRIC:  Normal affect    Signed, 01/03/21, MD  02/13/2022 11:33 AM    [facility]

## 2022-02-12 NOTE — H&P (View-Only) (Signed)
Cardiology Office Note:    Date:  02/13/2022   ID:  Matthew Mcneil, DOB 1940-06-21, MRN [id number]  PCP:  Gordan Payment., MD  Cardiologist:  Norman Herrlich, MD    Referring MD: Gordan Payment., MD    ASSESSMENT:    1. Nonrheumatic aortic valve stenosis   2. Benign hypertension with CKD (chronic kidney disease) stage III (HCC)   3. Carotid stenosis, bilateral   4. Pure hypercholesterolemia    PLAN:    In order of problems listed above:  Although he is minimally symptomatic he has developed severe aortic stenosis and has mild LV dysfunction and clearly needs to undergo consideration for intervention I think he be best served by TAVR we will go ahead and schedule left and right heart catheterization and cautiously admitted given the small dose of a calcium channel blocker for additional antihypertensive therapy.  He does have renal dysfunction. Poorly controlled hypertension stable CKD I am concerned there is recent edema and increased dose of diuretic as a reflection of early heart failure with his aortic stenosis Stable carotid disease Lipids are well-controlled continue Repatha   Next appointment: 6 weeks   Medication Adjustments/Labs and Tests Ordered: Current medicines are reviewed at length with the patient today.  Concerns regarding medicines are outlined above.  Orders Placed This Encounter  Procedures   Basic Metabolic Panel (BMET)   CBC   Pro b natriuretic peptide   EKG 12-Lead   No orders of the defined types were placed in this encounter.   Chief Complaint  Patient presents with   Follow-up   Aortic Stenosis    History of Present Illness:    Matthew Mcneil is a 81 y.o. male with a hx of aortic stenosis hypertension CKD CAD and hyperlipidemia last seen 01/16/2021.  Compliance with diet, lifestyle and medications: Yes  His wife is present participates in evaluation decision making Home blood pressure runs in the range of 140 systolic Is very  hypertensive today in my office which may play a role in his LV dysfunction on the echocardiogram Subtle he has had a change in his exercise tolerance when he does heavy outdoor work he has to stop and rest which she did not need to do a year ago He is not having edema orthopnea chest pain palpitation or syncope but he did have an increase in his distal diuretic by his PCP for peripheral edema recently Regardless he has LV dysfunction severe aortic stenosis and I have after reviewing options risk and benefits will undergo left and right heart catheterization for consideration of TAVR. I am reluctant to abruptly change his blood pressure with severe aortic stenosis and I will place him on a small dose of amlodipine which has a slow onset and continue to trend his blood pressure at home and continue his loop diuretic and his ARB. Recent labs from his PCP 10/31/2021 Creatinine 1.52 potassium 4.7 GFR 46 cc Cholesterol 110 LDL 52 triglycerides 132 HDL 44 Hemoglobin 12.7 platelets 173,000 His echocardiogram 01/08/2022 shows aortic valve to be tricuspid calcified restricted mild regurgitation and severe aortic stenosis with peak velocity of 4 m/s squared valve area 0.74 cm VTI ratio 0.24.  His ejection fraction was mildly reduced 45 to 50% normal right ventricular size function small pericardial effusion mild mitral regurgitation. Carotid duplex 01/08/2022 showed right ICA stenosis 60 to 79% left ICA stenosis 40 to 59%. Past Medical History:  Diagnosis Date   Abnormal ultrasound of carotid artery 01/23/2018   Anemia, chronic  disease 04/04/2020   Anxiety 04/29/2016   Atherosclerosis of both carotid arteries 09/15/2018   Formatting of this note might be different from the original. Follows with [facility].   Benign prostate hyperplasia 04/28/2015   Carotid artery occlusion    Chronic idiopathic gout 04/28/2015   Chronic kidney disease, stage III (moderate) (HCC) 04/28/2015   Colon polyp 04/28/2015   Coronary  artery disease involving native coronary artery of native heart with angina pectoris (HCC) 05/06/2016   Edema 04/28/2015   Essential hypertension 04/28/2015   High risk medication use 04/28/2015   History of COVID-19 05/20/2019   Formatting of this note might be different from the original. Late January 2021.   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 Follows with [facility].   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 Follows with [facility].   Venous stasis 09/07/2019   Vitamin B12 deficiency 04/28/2015    Past Surgical History:  Procedure Laterality Date   CARDIAC CATHETERIZATION     CATARACT EXTRACTION, BILATERAL     CORONARY ANGIOPLASTY     HEMORROIDECTOMY      Current Medications: Current Meds  Medication Sig   allopurinol (ZYLOPRIM) 100 MG tablet Take 1 tablet by mouth 2 (two) times daily.   Ascorbic Acid (VITAMIN C) 500 MG CAPS Take 1 capsule by mouth daily.   aspirin EC 81 MG tablet Take 1 tablet (81 mg total) by mouth daily.   escitalopram (LEXAPRO) 10 MG tablet Take 10 mg by mouth daily.   Evolocumab (REPATHA SURECLICK) 140 MG/ML SOAJ Inject 140 mg into the skin every 14 (fourteen) days. Keep appointment for 02/13/22 for further refills. 1 st attempt   finasteride (PROSCAR) 5 MG tablet Take 5 mg by mouth daily.   furosemide (LASIX) 40 MG tablet Take 40 mg by mouth daily. Patient states that he takes 40 mg in the morning and 20 mg at night.   latanoprost (XALATAN) 0.005 % ophthalmic  solution Place 1 drop into both eyes daily.   Multiple Vitamins-Minerals (PRESERVISION AREDS 2 PO) Take 1 tablet by mouth 2 (two) times daily.   nitroGLYCERIN (NITROSTAT) 0.4 MG SL tablet Place 1 tablet (0.4 mg total) under the tongue every 5 (five) minutes as needed for chest pain.   olmesartan (BENICAR) 40 MG tablet Take 1 tablet by mouth daily.   Omega-3 Fatty Acids (FISH OIL) 1000 MG CAPS Take 1 capsule by mouth 2 (two) times daily.   vitamin B-12 (CYANOCOBALAMIN) 500 MCG tablet Take 500 mcg by mouth every other day.     Allergies:   Spironolactone and Statins   Social History   Socioeconomic History   Marital status: Married    Spouse name: Not on file   Number of children: Not on file   Years of education: Not on file   Highest education level: Not on file  Occupational History   Not on file  Tobacco Use   Smoking status: Never   Smokeless tobacco: Never  Vaping Use   Vaping Use: Never used  Substance and Sexual Activity   Alcohol use: Not Currently   Drug use: Not Currently   Sexual activity: Not Currently  Other Topics Concern   Not on file  Social History Narrative   Not on file   Social Determinants of Health   Financial Resource Strain: Not on file  Food Insecurity: Not on file  Transportation Needs: Not on file  Physical Activity: Not on file  Stress: Not on file  Social Connections: Not on file     Family History: The patient's family history includes Cancer in his mother; Diabetes in his brother and mother; Heart attack in his brother, father, and mother; Hypertension in his father and mother. ROS:   Please see the history of present illness.    All other systems reviewed and are negative.  EKGs/Labs/Other Studies Reviewed:    The following studies were reviewed today:    Recent Labs: No results found for requested labs within last 365 days.  Recent Lipid Panel    Component Value Date/Time   CHOL 121 01/16/2021 0857   TRIG 172 (H) 01/16/2021  0857   HDL 44 01/16/2021 0857   CHOLHDL 2.8 01/16/2021 0857   LDLCALC 48 01/16/2021 0857    Physical Exam:    VS:  BP (!) 190/86   Pulse 67   Ht 5\' 6"  (1.676 m)   Wt 188 lb (85.3 kg)   SpO2 97%   BMI 30.34 kg/m     Wt Readings from Last 3 Encounters:  02/13/22 188 lb (85.3 kg)  01/16/21 198 lb 12.8 oz (90.2 kg)  01/01/21 198 lb (89.8 kg)     GEN:  Well nourished, well developed in no acute distress HEENT: Normal NECK: No JVD; No carotid bruits upstroke is diminished transmitted murmur present LYMPHATICS: No lymphadenopathy CARDIAC: Grade 3/6 to 4/6 holosystolic high-pitched murmur aortic stenosis encompasses S2 and radiates to the carotids no AR RRR, no murmurs, rubs, gallops RESPIRATORY:  Clear to auscultation without rales, wheezing or rhonchi  ABDOMEN: Soft, non-tender, non-distended MUSCULOSKELETAL:  No edema; No deformity  SKIN: Warm and dry NEUROLOGIC:  Alert and oriented x 3 PSYCHIATRIC:  Normal affect    Signed, 01/03/21, MD  02/13/2022 11:33 AM    [facility]

## 2022-02-13 ENCOUNTER — Ambulatory Visit: Payer: Medicare HMO | Attending: Cardiology | Admitting: Cardiology

## 2022-02-13 ENCOUNTER — Encounter: Payer: Self-pay | Admitting: Cardiology

## 2022-02-13 VITALS — BP 190/86 | HR 67 | Ht 66.0 in | Wt 188.0 lb

## 2022-02-13 DIAGNOSIS — I6523 Occlusion and stenosis of bilateral carotid arteries: Secondary | ICD-10-CM | POA: Diagnosis not present

## 2022-02-13 DIAGNOSIS — E7800 Pure hypercholesterolemia, unspecified: Secondary | ICD-10-CM | POA: Diagnosis not present

## 2022-02-13 DIAGNOSIS — I350 Nonrheumatic aortic (valve) stenosis: Secondary | ICD-10-CM

## 2022-02-13 DIAGNOSIS — I129 Hypertensive chronic kidney disease with stage 1 through stage 4 chronic kidney disease, or unspecified chronic kidney disease: Secondary | ICD-10-CM | POA: Diagnosis not present

## 2022-02-13 DIAGNOSIS — N183 Chronic kidney disease, stage 3 unspecified: Secondary | ICD-10-CM

## 2022-02-13 MED ORDER — AMLODIPINE BESYLATE 5 MG PO TABS: 5.0000 mg | ORAL_TABLET | Freq: Every day | ORAL | 3 refills | Status: DC

## 2022-02-13 NOTE — Patient Instructions (Addendum)
Medication Instructions:  Your physician has recommended you make the following change in your medication:   START: Norvasc 5 mg daily  *If you need a refill on your cardiac medications before your next appointment, please call your pharmacy*   Lab Work: Your physician recommends that you return for lab work in:   Labs today: BMP, CBC, Pro BNP  If you have labs (blood work) drawn today and your tests are completely normal, you will receive your results only by: MyChart Message (if you have MyChart) OR A paper copy in the mail If you have any lab test that is abnormal or we need to change your treatment, we will call you to review the results.   Testing/Procedures:  [place] A DEPT OF [facility] A DEPT OF Eligha Bridegroom [facility] [address] [place] [address] Dept: [phone] Loc: [phone]  Alonzo Owczarzak  02/13/2022  You are scheduled for a Cardiac Catheterization on Wednesday, December 13 with Dr. Alverda Skeans.  1. Please arrive at the [facility] (Main Entrance A) at [facility]: [address] [place], [place] [address] at 6:30 AM (This time is two hours before your procedure to ensure your preparation). Free valet parking service is available.   Special note: Every effort is made to have your procedure done on time. Please understand that emergencies sometimes delay scheduled procedures.  2. Diet: Do not eat solid foods after midnight.  The patient may have clear liquids until 5am upon the day of the procedure.  3. Labs: You will need to have blood drawn on Wednesday, December 6 at [facility]: [address], [place], [employer] . You do not need to be fasting.  4. Medication instructions in preparation for your procedure:   Hold Lasix the morning of the procedure.   Contrast Allergy: No  On the morning of your procedure, take your Aspirin 81 mg and any morning medicines NOT listed  above.  You may use sips of water.  5. Plan for one night stay--bring personal belongings. 6. Bring a current list of your medications and current insurance cards. 7. You MUST have a responsible person to drive you home. 8. Someone MUST be with you the first 24 hours after you arrive home or your discharge will be delayed. 9. Please wear clothes that are easy to get on and off and wear slip-on shoes.  Thank you for allowing [place] to care for you!   -- [facility]  Follow-Up: At [facility], you and your health needs are our priority.  As part of our continuing mission to provide you with exceptional heart care, we have created designated Provider Care Teams.  These Care Teams include your primary Cardiologist (physician) and Advanced Practice Providers (APPs -  Physician Assistants and Nurse Practitioners) who all work together to provide you with the care you need, when you need it.  We recommend signing up for the patient portal called "MyChart".  Sign up information is provided on this After Visit Summary.  MyChart is used to connect with patients for Virtual Visits (Telemedicine).  Patients are able to view lab/test results, encounter notes, upcoming appointments, etc.  Non-urgent messages can be sent to your provider as well.   To learn more about what you can do with MyChart, go to [email or web address].    Your next appointment:   6 week(s)  The format for your next appointment:  In Person  Provider:   Norman Herrlich, MD    Other Instructions None  Important Information About Sugar

## 2022-02-14 LAB — CBC
Hematocrit: 42.1 % (ref 37.5–51.0)
Hemoglobin: 13.6 g/dL (ref 13.0–17.7)
MCH: 27.8 pg (ref 26.6–33.0)
MCHC: 32.3 g/dL (ref 31.5–35.7)
MCV: 86 fL (ref 79–97)
Platelets: 165 10*3/uL (ref 150–450)
RBC: 4.9 x10E6/uL (ref 4.14–5.80)
RDW: 15.3 % (ref 11.6–15.4)
WBC: 7.8 10*3/uL (ref 3.4–10.8)

## 2022-02-14 LAB — BASIC METABOLIC PANEL
BUN/Creatinine Ratio: 25 — ABNORMAL HIGH (ref 10–24)
BUN: 34 mg/dL — ABNORMAL HIGH (ref 8–27)
CO2: 22 mmol/L (ref 20–29)
Calcium: 11 mg/dL — ABNORMAL HIGH (ref 8.6–10.2)
Chloride: 105 mmol/L (ref 96–106)
Creatinine, Ser: 1.37 mg/dL — ABNORMAL HIGH (ref 0.76–1.27)
Glucose: 89 mg/dL (ref 70–99)
Potassium: 4.5 mmol/L (ref 3.5–5.2)
Sodium: 144 mmol/L (ref 134–144)
eGFR: 52 mL/min/{1.73_m2} — ABNORMAL LOW (ref >59)

## 2022-02-14 LAB — PRO B NATRIURETIC PEPTIDE: NT-Pro BNP: 1314 pg/mL — ABNORMAL HIGH (ref 0–486)

## 2022-02-18 ENCOUNTER — Telehealth: Payer: Self-pay | Admitting: *Deleted

## 2022-02-18 NOTE — Telephone Encounter (Addendum)
Cardiac Catheterization scheduled at [facility] for: Wednesday February 20, 2022 8:30 AM Arrival time and place: [facility] Main Entrance A at: 6:30 AM  Nothing to eat after midnight prior to procedure, clear liquids until 5 AM day of procedure.  Medication instructions: -Hold:  Lasix/Olmesartan-day before and day of procedure-per protocol GFR 52 -Except hold medications usual morning medications can be taken with sips of water including aspirin 81 mg.  Confirmed patient has responsible adult to drive home post procedure and be with patient first 24 hours after arriving home.  Patient reports no new symptoms concerning for COVID-19 in the past 10 days.  Reviewed procedure instructions with patient.

## 2022-02-19 ENCOUNTER — Other Ambulatory Visit: Payer: Self-pay | Admitting: Cardiology

## 2022-02-20 ENCOUNTER — Encounter (HOSPITAL_COMMUNITY): Payer: Self-pay | Admitting: Internal Medicine

## 2022-02-20 ENCOUNTER — Other Ambulatory Visit: Payer: Self-pay

## 2022-02-20 ENCOUNTER — Ambulatory Visit (HOSPITAL_COMMUNITY)
Admission: RE | Disposition: A | Discharge status: Home / Self Care | Payer: Medicare HMO | Source: Home / Self Care | Attending: Internal Medicine

## 2022-02-20 ENCOUNTER — Ambulatory Visit (HOSPITAL_COMMUNITY)
Admission: RE | Admit: 2022-02-20 | Discharge: 2022-02-20 | Disposition: A | Discharge status: Home / Self Care | Payer: Medicare HMO | Attending: Internal Medicine | Admitting: Internal Medicine

## 2022-02-20 DIAGNOSIS — I6523 Occlusion and stenosis of bilateral carotid arteries: Secondary | ICD-10-CM | POA: Diagnosis not present

## 2022-02-20 DIAGNOSIS — E7800 Pure hypercholesterolemia, unspecified: Secondary | ICD-10-CM | POA: Insufficient documentation

## 2022-02-20 DIAGNOSIS — I2510 Atherosclerotic heart disease of native coronary artery without angina pectoris: Secondary | ICD-10-CM | POA: Insufficient documentation

## 2022-02-20 DIAGNOSIS — I129 Hypertensive chronic kidney disease with stage 1 through stage 4 chronic kidney disease, or unspecified chronic kidney disease: Secondary | ICD-10-CM | POA: Diagnosis not present

## 2022-02-20 DIAGNOSIS — N183 Chronic kidney disease, stage 3 unspecified: Secondary | ICD-10-CM | POA: Diagnosis not present

## 2022-02-20 DIAGNOSIS — I350 Nonrheumatic aortic (valve) stenosis: Secondary | ICD-10-CM

## 2022-02-20 DIAGNOSIS — Z79899 Other long term (current) drug therapy: Secondary | ICD-10-CM | POA: Diagnosis not present

## 2022-02-20 HISTORY — PX: RIGHT/LEFT HEART CATH AND CORONARY ANGIOGRAPHY: CATH118266

## 2022-02-20 LAB — POCT I-STAT EG7
Acid-Base Excess: 0 mmol/L (ref 0.0–2.0)
Acid-base deficit: 1 mmol/L (ref 0.0–2.0)
Bicarbonate: 26.3 mmol/L (ref 20.0–28.0)
Bicarbonate: 25.9 mmol/L (ref 20.0–28.0)
Calcium, Ion: 1.45 mmol/L — ABNORMAL HIGH (ref 1.15–1.40)
Calcium, Ion: 1.44 mmol/L — ABNORMAL HIGH (ref 1.15–1.40)
HCT: 38 % — ABNORMAL LOW (ref 39.0–52.0)
HCT: 38 % — ABNORMAL LOW (ref 39.0–52.0)
Hemoglobin: 12.9 g/dL — ABNORMAL LOW (ref 13.0–17.0)
Hemoglobin: 12.9 g/dL — ABNORMAL LOW (ref 13.0–17.0)
O2 Saturation: 71 %
O2 Saturation: 69 %
Potassium: 4.4 mmol/L (ref 3.5–5.1)
Potassium: 4.4 mmol/L (ref 3.5–5.1)
Sodium: 146 mmol/L — ABNORMAL HIGH (ref 135–145)
Sodium: 146 mmol/L — ABNORMAL HIGH (ref 135–145)
TCO2: 28 mmol/L (ref 22–32)
TCO2: 27 mmol/L (ref 22–32)
pCO2, Ven: 49.7 mmHg (ref 44–60)
pCO2, Ven: 49.5 mmHg (ref 44–60)
pH, Ven: 7.331 (ref 7.25–7.43)
pH, Ven: 7.327 (ref 7.25–7.43)
pO2, Ven: 41 mmHg (ref 32–45)
pO2, Ven: 39 mmHg (ref 32–45)

## 2022-02-20 LAB — POCT I-STAT 7, (LYTES, BLD GAS, ICA,H+H)
Acid-Base Excess: 0 mmol/L (ref 0.0–2.0)
Bicarbonate: 26.1 mmol/L (ref 20.0–28.0)
Calcium, Ion: 1.4 mmol/L (ref 1.15–1.40)
HCT: 38 % — ABNORMAL LOW (ref 39.0–52.0)
Hemoglobin: 12.9 g/dL — ABNORMAL LOW (ref 13.0–17.0)
O2 Saturation: 91 %
Potassium: 4.4 mmol/L (ref 3.5–5.1)
Sodium: 146 mmol/L — ABNORMAL HIGH (ref 135–145)
TCO2: 28 mmol/L (ref 22–32)
pCO2 arterial: 46.6 mmHg (ref 32–48)
pH, Arterial: 7.357 (ref 7.35–7.45)
pO2, Arterial: 65 mmHg — ABNORMAL LOW (ref 83–108)

## 2022-02-20 SURGERY — RIGHT/LEFT HEART CATH AND CORONARY ANGIOGRAPHY
Anesthesia: LOCAL

## 2022-02-20 MED ORDER — ASPIRIN 81 MG PO CHEW: 81.0000 mg | CHEWABLE_TABLET | ORAL | Status: DC

## 2022-02-20 MED ORDER — SODIUM CHLORIDE 0.9% FLUSH: 3.0000 mL | Freq: Two times a day (BID) | INTRAVENOUS | Status: DC

## 2022-02-20 MED ORDER — HEPARIN (PORCINE) IN NACL 1000-0.9 UT/500ML-% IV SOLN
INTRAVENOUS | Status: AC
  Filled 2022-02-20: qty 500

## 2022-02-20 MED ORDER — VERAPAMIL HCL 2.5 MG/ML IV SOLN
INTRAVENOUS | Status: AC
  Filled 2022-02-20: qty 2

## 2022-02-20 MED ORDER — MIDAZOLAM HCL 2 MG/2ML IJ SOLN
INTRAMUSCULAR | Status: DC | PRN
  Administered 2022-02-20: 1 mg via INTRAVENOUS

## 2022-02-20 MED ORDER — LIDOCAINE HCL (PF) 1 % IJ SOLN
INTRAMUSCULAR | Status: DC | PRN
  Administered 2022-02-20 (×2): 2 mL

## 2022-02-20 MED ORDER — VERAPAMIL HCL 2.5 MG/ML IV SOLN
INTRAVENOUS | Status: DC | PRN
  Administered 2022-02-20: 10 mL via INTRA_ARTERIAL

## 2022-02-20 MED ORDER — HEPARIN SODIUM (PORCINE) 1000 UNIT/ML IJ SOLN
INTRAMUSCULAR | Status: DC | PRN
  Administered 2022-02-20: 5000 [IU] via INTRAVENOUS

## 2022-02-20 MED ORDER — HYDRALAZINE HCL 20 MG/ML IJ SOLN: 10.0000 mg | INTRAMUSCULAR | Status: AC | PRN

## 2022-02-20 MED ORDER — ONDANSETRON HCL 4 MG/2ML IJ SOLN: 4.0000 mg | Freq: Four times a day (QID) | INTRAMUSCULAR | Status: DC | PRN

## 2022-02-20 MED ORDER — ACETAMINOPHEN 325 MG PO TABS: 650.0000 mg | ORAL_TABLET | ORAL | Status: DC | PRN

## 2022-02-20 MED ORDER — SODIUM CHLORIDE 0.9% FLUSH: 3.0000 mL | INTRAVENOUS | Status: DC | PRN

## 2022-02-20 MED ORDER — SODIUM CHLORIDE 0.9 % WEIGHT BASED INFUSION
3.0000 mL/kg/h | INTRAVENOUS | Status: AC
  Administered 2022-02-20: 3 mL/kg/h via INTRAVENOUS

## 2022-02-20 MED ORDER — METOPROLOL TARTRATE 50 MG PO TABS: ORAL_TABLET | ORAL | 0 refills | Status: DC

## 2022-02-20 MED ORDER — FENTANYL CITRATE (PF) 100 MCG/2ML IJ SOLN
INTRAMUSCULAR | Status: AC
  Filled 2022-02-20: qty 2

## 2022-02-20 MED ORDER — SODIUM CHLORIDE 0.9 % IV SOLN: 250.0000 mL | INTRAVENOUS | Status: DC | PRN

## 2022-02-20 MED ORDER — IOHEXOL 350 MG/ML SOLN
INTRAVENOUS | Status: DC | PRN
  Administered 2022-02-20: 75 mL

## 2022-02-20 MED ORDER — LABETALOL HCL 5 MG/ML IV SOLN: 10.0000 mg | INTRAVENOUS | Status: AC | PRN

## 2022-02-20 MED ORDER — FENTANYL CITRATE (PF) 100 MCG/2ML IJ SOLN
INTRAMUSCULAR | Status: DC | PRN
  Administered 2022-02-20: 25 ug via INTRAVENOUS

## 2022-02-20 MED ORDER — MIDAZOLAM HCL 2 MG/2ML IJ SOLN
INTRAMUSCULAR | Status: AC
  Filled 2022-02-20: qty 2

## 2022-02-20 MED ORDER — HEPARIN (PORCINE) IN NACL 1000-0.9 UT/500ML-% IV SOLN
INTRAVENOUS | Status: AC
  Filled 2022-02-20: qty 1000

## 2022-02-20 MED ORDER — SODIUM CHLORIDE 0.9 % IV SOLN: INTRAVENOUS | Status: AC

## 2022-02-20 MED ORDER — SODIUM CHLORIDE 0.9 % WEIGHT BASED INFUSION: 1.0000 mL/kg/h | INTRAVENOUS | Status: DC

## 2022-02-20 MED ORDER — LIDOCAINE HCL (PF) 1 % IJ SOLN
INTRAMUSCULAR | Status: AC
  Filled 2022-02-20: qty 30

## 2022-02-20 MED ORDER — HEPARIN (PORCINE) IN NACL 1000-0.9 UT/500ML-% IV SOLN
INTRAVENOUS | Status: DC | PRN
  Administered 2022-02-20 (×2): 500 mL

## 2022-02-20 SURGICAL SUPPLY — 16 items
CATH INFINITI 5FR ANG PIGTAIL (CATHETERS) IMPLANT
CATH INFINITI JR4 5F (CATHETERS) IMPLANT
CATH OPTITORQUE TIG 4.0 6F (CATHETERS) IMPLANT
CATH SWAN GANZ 7F STRAIGHT (CATHETERS) IMPLANT
DEVICE RAD COMP TR BAND LRG (VASCULAR PRODUCTS) IMPLANT
GLIDESHEATH SLEND SS 6F .021 (SHEATH) IMPLANT
GLIDESHEATH SLENDER 7FR .021G (SHEATH) IMPLANT
GUIDEWIRE .025 260CM (WIRE) IMPLANT
KIT HEART LEFT (KITS) ×2 IMPLANT
PACK CARDIAC CATHETERIZATION (CUSTOM PROCEDURE TRAY) ×2 IMPLANT
STOPCOCK MORSE 400PSI 3WAY (MISCELLANEOUS) IMPLANT
SYR MEDRAD MARK V 150ML (SYRINGE) IMPLANT
TRANSDUCER W/STOPCOCK (MISCELLANEOUS) ×2 IMPLANT
TUBING CIL FLEX 10 FLL-RA (TUBING) ×2 IMPLANT
TUBING CONTRAST HIGH PRESS 48 (TUBING) IMPLANT
WIRE EMERALD 3MM-J .035X260CM (WIRE) IMPLANT

## 2022-02-20 NOTE — Interval H&P Note (Signed)
History and Physical Interval Note:  02/20/2022 6:48 AM  Matthew Mcneil  has presented today for surgery, with the diagnosis of AORTIC STENOSIS.  The various methods of treatment have been discussed with the patient and family. After consideration of risks, benefits and other options for treatment, the patient has consented to  Procedure(s): RIGHT/LEFT HEART CATH AND CORONARY ANGIOGRAPHY (N/A) as a surgical intervention.  The patient's history has been reviewed, patient examined, no change in status, stable for surgery.  I have reviewed the patient's chart and labs.  Questions were answered to the patient's satisfaction.     [name]

## 2022-02-21 MED FILL — Heparin Sod (Porcine)-NaCl IV Soln 1000 Unit/500ML-0.9%: INTRAVENOUS | Qty: 1000 | Status: AC

## 2022-03-05 NOTE — Progress Notes (Signed)
Patient ID: Matthew Mcneil MRN: [id number] DOB/AGE: Mar 08, 1941 81 y.o.  Primary Care Physician:Grisso, Evlyn CourierGreg A., MD Primary Cardiologist: Norman HerrlichMunley, Brian, MD   FOCUSED CARDIOVASCULAR PROBLEM LIST:   1.  Hypertension 2.  Hyperlipidemia 3.  Severe symptomatic aortic stenosis with an ejection fraction of 40 to 50% with an aortic valve area of 0.74 cm, mean gradient 33 mmHg, and peak velocity of 4 m/s; EKG with sinus rhythm without bundle-branch blocks 4.  Macular degeneration with vision impairment   HISTORY OF PRESENT ILLNESS: The patient is a 81 y.o. male with the indicated medical history here for recommendations regarding his severe symptomatic aortic stenosis.  He has been followed by Dr. Dulce SellarMunley for some time.  The patient has noticed mildly increasing shortness of breath with activities outside such as mowing the lawn.  On further query he tells me that this is relatively unchanged versus how he felt within the last few years.  He denies any exertional angina, presyncope, or syncope.  Serial echocardiograms however have demonstrated progression of aortic stenosis to severe with the diminution in ejection fraction.  Fortunately the patient has not developed any heart failure symptoms.  He and his wife who is here today however concerned about the progression of disease and whether this will impact his quality of life later.  The patient recently saw a dentist and was told he has good dental health.  Past Medical History:  Diagnosis Date   Abnormal ultrasound of carotid artery 01/23/2018   Anemia, chronic disease 04/04/2020   Anxiety 04/29/2016   Atherosclerosis of both carotid arteries 09/15/2018   Formatting of this note might be different from the original. [facility]   Benign prostate hyperplasia 04/28/2015   Carotid artery occlusion    Chronic idiopathic gout 04/28/2015   Chronic kidney disease, stage III (moderate) (HCC) 04/28/2015   Colon polyp 04/28/2015   Coronary  artery disease involving native coronary artery of native heart with angina pectoris (HCC) 05/06/2016   Edema 04/28/2015   Essential hypertension 04/28/2015   High risk medication use 04/28/2015   History of COVID-19 05/20/2019   Formatting of this note might be different from the original. Late January 2021.   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 [facility]   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 [facility]   Venous stasis 09/07/2019   Vitamin B12 deficiency 04/28/2015    Past Surgical History:  Procedure Laterality Date   CARDIAC CATHETERIZATION     CATARACT EXTRACTION, BILATERAL     CORONARY ANGIOPLASTY     HEMORROIDECTOMY     RIGHT/LEFT HEART CATH AND CORONARY ANGIOGRAPHY N/A 02/20/2022   Procedure: RIGHT/LEFT HEART CATH AND CORONARY ANGIOGRAPHY;  Surgeon: Orbie Pyohukkani, Rosalin Buster K, MD;  Location: [facility] INVASIVE CV [facility];  Service: Cardiovascular;  Laterality: N/A;    Family History  Problem Relation Age of Onset   Cancer Mother    Heart attack Mother    Hypertension Mother    Diabetes Mother    Hypertension Father  Heart attack Father    Heart attack Brother    Diabetes Brother     Social History   Socioeconomic History   Marital status: Married    Spouse name: Not on file   Number of children: Not on file   Years of education: Not on file   Highest education level: Not on file  Occupational History   Not on file  Tobacco Use   Smoking status: Never    Smokeless tobacco: Never  Vaping Use   Vaping Use: Never used  Substance and Sexual Activity   Alcohol use: Not Currently   Drug use: Not Currently   Sexual activity: Not Currently  Other Topics Concern   Not on file  Social History Narrative   Not on file   Social Determinants of Health   Financial Resource Strain: Not on file  Food Insecurity: Not on file  Transportation Needs: Not on file  Physical Activity: Not on file  Stress: Not on file  Social Connections: Not on file  Intimate Partner Violence: Not on file     Prior to Admission medications   Medication Sig Start Date End Date Taking? Authorizing Provider  metoprolol tartrate (LOPRESSOR) 50 MG tablet Take as directed prior to CT scan 02/20/22   [name], MD  allopurinol (ZYLOPRIM) 100 MG tablet Take 100 mg by mouth 2 (two) times daily. 10/15/17   [provider]  amLODipine (NORVASC) 5 MG tablet Take 1 tablet (5 mg total) by mouth daily. 02/13/22   Baldo Daub, MD  Ascorbic Acid (VITAMIN C) 500 MG CAPS Take 500 mg by mouth daily.    [provider]  aspirin EC 81 MG tablet Take 1 tablet (81 mg total) by mouth daily. 12/09/17   Baldo Daub, MD  escitalopram (LEXAPRO) 10 MG tablet Take 10 mg by mouth daily.    [provider]  Evolocumab (REPATHA SURECLICK) 140 MG/ML SOAJ Inject 140 mg into the skin every 14 (fourteen) days. 02/19/22   Baldo Daub, MD  finasteride (PROSCAR) 5 MG tablet Take 5 mg by mouth daily.    [provider]  furosemide (LASIX) 40 MG tablet Take 20-40 mg by mouth See admin instructions. Patient states that he takes 40 mg in the morning and 20 mg at night.    [provider]  latanoprost (XALATAN) 0.005 % ophthalmic solution Place 1 drop into both eyes daily. 05/28/19   [provider]  Multiple Vitamins-Minerals (PRESERVISION AREDS 2 PO) Take 1 tablet by mouth 2 (two) times daily.    [provider]  nitroGLYCERIN (NITROSTAT)  0.4 MG SL tablet Place 1 tablet (0.4 mg total) under the tongue every 5 (five) minutes as needed for chest pain. 12/22/17 02/14/22  Baldo Daub, MD  olmesartan (BENICAR) 40 MG tablet Take 40 mg by mouth daily. 06/09/19   [provider]  Omega-3 Fatty Acids (FISH OIL) 1000 MG CAPS Take 1,000 mg by mouth 2 (two) times daily.    [provider]  Psyllium (METAMUCIL) 28.3 % POWD Take 10 mLs by mouth daily. 8 oz of Water    [provider]  vitamin B-12 (CYANOCOBALAMIN) 500 MCG tablet Take 500 mcg by mouth 3 (three) times a week.    [provider]    Allergies  Allergen Reactions   Spironolactone Other (See Comments)    Patient was in a panic   Statins Other (See Comments)    Unknown Myalgia  REVIEW OF SYSTEMS:  General: no fevers/chills/night sweats Eyes: no blurry vision, diplopia, or amaurosis ENT: no sore throat or hearing loss Resp: no cough, wheezing, or hemoptysis CV: no edema or palpitations GI: no abdominal pain, nausea, vomiting, diarrhea, or constipation GU: no dysuria, frequency, or hematuria Skin: no rash Neuro: no headache, numbness, tingling, or weakness of extremities Musculoskeletal: no joint pain or swelling Heme: no bleeding, DVT, or easy bruising Endo: no polydipsia or polyuria  BP (!) 173/80   Pulse 66   Ht 5\' 6"  (1.676 m)   Wt 189 lb 12.8 oz (86.1 kg)   SpO2 98%   BMI 30.63 kg/m   PHYSICAL EXAM: GEN:  AO x 3 in no acute distress HEENT: normal Dentition: Normal Neck: JVP normal. +2 carotid upstrokes without bruits. No thyromegaly. Lungs: equal expansion, clear bilaterally CV: Apex is discrete and nondisplaced, RRR with 3/6 SEM Abd: soft, non-tender, non-distended; no bruit; positive bowel sounds Ext: no edema, ecchymoses, or cyanosis Vascular: 2+ femoral pulses, 2+ radial pulses       Skin: warm and dry without rash Neuro: CN II-XII grossly intact; motor and sensory grossly intact    DATA AND  STUDIES:  EKG: Sinus rhythm with PACs  2D ECHO: October 2023  1. Left ventricular ejection fraction, by estimation, is 45 to 50%. Left  ventricular ejection fraction by 2D MOD biplane is 48.6 %. The left  ventricle has mildly decreased function. The left ventricle demonstrates  global hypokinesis. Left ventricular  diastolic parameters are consistent with Grade II diastolic dysfunction  (pseudonormalization). Elevated left atrial pressure. The average left  ventricular global longitudinal strain is -10.7 %. The global longitudinal  strain is abnormal.   2. Right ventricular systolic function is normal. The right ventricular  size is normal. There is mildly elevated pulmonary artery systolic  pressure.   3. A small pericardial effusion is present. The pericardial effusion is  posterior to the left ventricle.   4. The mitral valve is normal in structure. Mild mitral valve  regurgitation. No evidence of mitral stenosis.   5. Peak AV velocity 4 m/sec2 from RSB      . The aortic valve is tricuspid. There is moderate calcification of  the aortic valve. There is moderate thickening of the aortic valve. Aortic  valve regurgitation is mild. Severe aortic valve stenosis.   6. Aortic Normal DTA.   7. The inferior vena cava is normal in size with greater than 50%  respiratory variability, suggesting right atrial pressure of 3 mmHg.   CARDIAC CATH: December 2023 1.  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 2.  Capacious iliofemoral vessels of the right side. 3.  Normal cardiac output of 7 L/min and cardiac index of 3.6 L/min/m with mean RA pressure of 3 mmHg and mean wedge pressure of 10 mmHg.  STS RISK CALCULATOR: pending  NHYA CLASS: 2    ASSESSMENT AND PLAN:   Nonrheumatic aortic valve stenosis  Essential hypertension  Hyperlipidemia LDL goal <70  Coronary artery disease involving native coronary artery of native heart without angina pectoris  The  patient has developed severe symptomatic aortic stenosis with NYHA class II symptoms.  Additionally his ejection fraction is now abnormal.  His echocardiogram which demonstrates a highly calcified valve with restricted motion.  He is underwent coronary angiography and right heart catheterization.  It looks like he has a right femoral access for potential TAVR procedure.  He has already been scheduled for TAVR protocol CTA and  a cardiothoracic surgical opinion to assess his candidacy for surgical aortic valve replacement versus TAVR.  He did see the dentist recently and has good dental health.  Further recommendations will be issued once his CT scan and surgical opinion have been reviewed.  I have personally reviewed the patients imaging data as summarized above.  I have reviewed the natural history of aortic stenosis with the patient and family members who are present today. We have discussed the limitations of medical therapy and the poor prognosis associated with symptomatic aortic stenosis. We have also reviewed potential treatment options, including palliative medical therapy, conventional surgical aortic valve replacement, and transcatheter aortic valve replacement. We discussed treatment options in the context of this patient's specific comorbid medical conditions.   All of the patient's questions were answered today. Will make further recommendations based on the results of studies outlined above.   Total time spent with patient today 60 minutes. This includes reviewing records, evaluating the patient and coordinating care.   [name], MD  03/08/2022 9:19 AM    [facility] [address] [place], [place], [place]  [address] Phone: [phone]; Fax: [phone]

## 2022-03-08 ENCOUNTER — Encounter: Payer: Self-pay | Admitting: Internal Medicine

## 2022-03-08 ENCOUNTER — Ambulatory Visit: Payer: Medicare HMO | Admitting: Internal Medicine

## 2022-03-08 ENCOUNTER — Ambulatory Visit (HOSPITAL_COMMUNITY)
Admission: RE | Admit: 2022-03-08 | Discharge: 2022-03-08 | Disposition: A | Discharge status: Home / Self Care | Payer: Medicare HMO | Source: Ambulatory Visit | Attending: Internal Medicine | Admitting: Internal Medicine

## 2022-03-08 VITALS — BP 173/80 | HR 66 | Ht 66.0 in | Wt 189.8 lb

## 2022-03-08 DIAGNOSIS — I2510 Atherosclerotic heart disease of native coronary artery without angina pectoris: Secondary | ICD-10-CM | POA: Insufficient documentation

## 2022-03-08 DIAGNOSIS — E785 Hyperlipidemia, unspecified: Secondary | ICD-10-CM

## 2022-03-08 DIAGNOSIS — I10 Essential (primary) hypertension: Secondary | ICD-10-CM | POA: Insufficient documentation

## 2022-03-08 DIAGNOSIS — I350 Nonrheumatic aortic (valve) stenosis: Secondary | ICD-10-CM | POA: Diagnosis present

## 2022-03-08 MED ORDER — IOHEXOL 350 MG/ML SOLN
100.0000 mL | Freq: Once | INTRAVENOUS | Status: AC | PRN
  Administered 2022-03-08: 100 mL via INTRAVENOUS

## 2022-03-08 NOTE — Patient Instructions (Signed)
Medication Instructions:  Your physician recommends that you continue on your current medications as directed. Please refer to the Current Medication list given to you today.  *If you need a refill on your cardiac medications before your next appointment, please call your pharmacy*   Lab Work: NONE If you have labs (blood work) drawn today and your tests are completely normal, you will receive your results only by: MyChart Message (if you have MyChart) OR A paper copy in the mail If you have any lab test that is abnormal or we need to change your treatment, we will call you to review the results.   Testing/Procedures: NONE   Follow-Up: TO Be Determined At [facility], you and your health needs are our priority.  As part of our continuing mission to provide you with exceptional heart care, we have created designated Provider Care Teams.  These Care Teams include your primary Cardiologist (physician) and Advanced Practice Providers (APPs -  Physician Assistants and Nurse Practitioners) who all work together to provide you with the care you need, when you need it.    Important Information About Sugar

## 2022-03-08 NOTE — Progress Notes (Addendum)
Pre Surgical Assessment: 5 M Walk Test  28M=16.26ft  5 Meter Walk Test- trial 1: 47 seconds 5 Meter Walk Test- trial 2: 40 seconds 5 Meter Walk Test- trial 3: 35 seconds 5 Meter Walk Test Average: 40.67 seconds    _____________________________  Procedure Type: Isolated AVR Perioperative Outcome Estimate % Operative Mortality 3.93% Morbidity & Mortality 11.2% Stroke 1.28% Renal Failure 2.44% Reoperation 4.14% Prolonged Ventilation 5.9% Deep Sternal Wound Infection 0.059% Long Hospital Stay (>14 days) 5.7% Short Hospital Stay (<6 days)* 38.4%

## 2022-03-19 ENCOUNTER — Other Ambulatory Visit: Payer: Self-pay

## 2022-03-19 DIAGNOSIS — I350 Nonrheumatic aortic (valve) stenosis: Secondary | ICD-10-CM

## 2022-03-20 NOTE — Progress Notes (Unsigned)
[address]       [place] [address]             [phone]                    [facility] Medical Record #[id number] Date of Birth: [birth year]  Referring: Matthew Daub, MD Primary Care: Matthew Mcneil., MD Primary Cardiologist: Matthew Herrlich, MD  Chief Complaint:   No chief complaint on file.   History of Present Illness:    Matthew [facility] 82 y.o. male referred in consultation for transcatheter aortic valve implantation.   Her HPI per cardiology involves " The patient is a 82 y.o. male with the indicated medical history here for recommendations regarding his severe symptomatic aortic stenosis.  He has been followed by Dr. Dulce [facility]ne[facility] for some time.  The patient has noticed mildly increasing shortness of breath with activities outside such as mowing the lawn.  On further query he tells me that this is relatively unchanged versus how he felt within the last few years.  He denies any exertional angina, presyncope, or syncope.  Serial echocardiograms however have demonstrated progression of aortic stenosis to severe with the diminution in ejection fraction.  Fortunately the patient has not developed any heart failure symptoms.  He and his wife who is here today however concerned about the progression of disease and whether this will impact his quality of life later.   The patient recently saw a dentist and was told he has good dental health.  FOCUSED CARDIOVASCULAR PROBLEM LIST:   1.  Hypertension 2.  Hyperlipidemia 3.  Severe symptomatic aortic stenosis with an ejection fraction of 40 to 50% with an aortic valve area of 0.74 cm, mean gradient 33 mmHg, and peak velocity of 4 m/s; EKG with sinus rhythm without bundle-branch blocks 4.  Macular degeneration with vision impairment"  I have independently confirmed increasing symptoms of valvular heart disease     Past Medical History:  Diagnosis Date   Abnormal ultrasound of carotid artery  01/23/2018   Anemia, chronic disease 04/04/2020   Anxiety 04/29/2016   Atherosclerosis of both carotid arteries 09/15/2018   Formatting of this note might be different from the original. Follows with [facility]   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 Late January 2021.   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 Follows with [facility]   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   Venous stasis 09/07/2019   Vitamin B12 deficiency 04/28/2015    Past Surgical History:  Procedure Laterality Date   CARDIAC CATHETERIZATION     CATARACT EXTRACTION, BILATERAL     CORONARY ANGIOPLASTY     HEMORROIDECTOMY     RIGHT/LEFT  HEART CATH AND CORONARY ANGIOGRAPHY N/A 02/20/2022   Procedure: RIGHT/LEFT HEART CATH AND CORONARY ANGIOGRAPHY;  Surgeon: [name], MD;  Location: [facility];  Service: Cardiovascular;  Laterality: N/A;    Family History  Problem Relation Age of Onset   Cancer Mother    Heart attack Mother    Hypertension Mother    Diabetes Mother    Hypertension Father    Heart attack Father    Heart attack Brother    Diabetes Brother      Social  History   Tobacco Use  Smoking Status Never  Smokeless Tobacco Never    Social History   Substance and Sexual Activity  Alcohol Use Not Currently     Allergies  Allergen Reactions   Spironolactone Other (See Comments)    Patient was in a panic   Statins Other (See Comments)    Unknown Myalgia     Current Outpatient Medications  Medication Sig Dispense Refill   allopurinol (ZYLOPRIM) 100 MG tablet Take 100 mg by mouth 2 (two) times daily.     amLODipine (NORVASC) 5 MG tablet Take 1 tablet (5 mg total) by mouth daily. 90 tablet 3   Ascorbic Acid (VITAMIN C) 500 MG CAPS Take 500 mg by mouth daily.     aspirin EC 81 MG tablet Take 1 tablet (81 mg total) by mouth daily. 90 tablet 3   escitalopram (LEXAPRO) 10 MG tablet Take 10 mg by mouth daily.     Evolocumab (REPATHA SURECLICK) 140 MG/ML SOAJ Inject 140 mg into the skin every 14 (fourteen) days. 6 mL 3   finasteride (PROSCAR) 5 MG tablet Take 5 mg by mouth daily.     furosemide (LASIX) 40 MG tablet Take 20-40 mg by mouth See admin instructions. Patient states that he takes 40 mg in the morning and 20 mg at night.     latanoprost (XALATAN) 0.005 % ophthalmic solution Place 1 drop into both eyes daily.     Multiple Vitamins-Minerals (PRESERVISION AREDS 2 PO) Take 1 tablet by mouth 2 (two) times daily.     nitroGLYCERIN (NITROSTAT) 0.4 MG SL tablet Place 1 tablet (0.4 mg total) under the tongue every 5 (five) minutes as needed for chest pain. 25 tablet 11   olmesartan (BENICAR) 40 MG tablet Take 40 mg by mouth daily.     Omega-3 Fatty Acids (FISH OIL) 1000 MG CAPS Take 1,000 mg by mouth 2 (two) times daily.     Psyllium (METAMUCIL) 28.3 % POWD Take 10 mLs by mouth daily. 8 oz of Water     vitamin B-12 (CYANOCOBALAMIN) 500 MCG tablet Take 500 mcg by mouth 3 (three) times a week.     No current facility-administered medications for this visit.    ROS 14 point ROS reviewed and negative except as per HPI   PHYSICAL  EXAMINATION: There were no vitals taken for this visit.  Gen: NAD Neuro: Alert and oriented CV: + systolic murmur Resp: Nonlaboured Abd: Soft, ntnd Extr: WWP  Diagnostic Studies & Laboratory data:     Recent Radiology Findings:   CT CORONARY MORPH W/CTA COR W/SCORE W/CA W/CM &/OR WO/CM  Addendum Date: 03/14/2022   ADDENDUM REPORT: 03/14/2022 06:45 ADDENDUM: Extracardiac findings were described separately under dictation for contemporaneously obtained CTA chest, abdomen and pelvis dated 03/08/2022, please see that report for full description of relevant extracardiac findings. Electronically Signed   By: Trudie Reed M.D.   On: 03/14/2022 06:45   Result Date:  03/14/2022 CLINICAL DATA:  Severe Aortic Stenosis. EXAM: Cardiac TAVR CT TECHNIQUE: A non-contrast, gated CT scan was obtained with axial slices of 3 mm through the heart for aortic valve calcium scoring. A 120 kV retrospective, gated, contrast cardiac scan was obtained. Gantry rotation speed was 250 msecs and collimation was 0.6 mm. Nitroglycerin was not given. The 3D data set was reconstructed in 5% intervals of the 0-95% of the R-R cycle. Systolic and diastolic phases were analyzed on a dedicated workstation using MPR, MIP, and VRT modes. The patient received 100 cc of contrast. FINDINGS: Image quality: Excellent. Noise artifact is: Limited. Valve Morphology: Tricuspid aortic valve with diffuse severe calcifications. Severely restricted leaflet movement in systole. Aortic Valve Calcium score: 1458 Aortic annular dimension: Phase assessed: 15% Annular area: 452 mm2 Annular perimeter: 76.5 mm Max diameter: 26.0 mm Min diameter: 23.0 mm Annular and subannular calcification: None. Membranous septum length: 8.9 mm Optimal coplanar projection: LAO 21 CAU 13 Coronary Artery Height above Annulus: Left Main: 11.0 mm Right Coronary: 18.7 mm Sinus of Valsalva Measurements: Non-coronary: 32 mm Right-coronary: 31 mm Left-coronary: 33 mm Sinus of Valsalva  Height: Non-coronary: 21.2 mm Right-coronary: 22.9 mm Left-coronary: 21.7 mm Sinotubular Junction: 29 mm Ascending Thoracic Aorta: 36 mm Coronary Arteries: Normal coronary origin. Left dominance. The study was performed without use of NTG and is insufficient for plaque evaluation. Please refer to recent cardiac catheterization for coronary assessment. Cardiac Morphology: Right Atrium: Right atrial size is within normal limits. Right Ventricle: The right ventricular cavity is within normal limits. Left Atrium: Left atrial size is normal in size with no left atrial appendage filling defect. Left Ventricle: The ventricular cavity size is within normal limits. Pulmonary arteries: Dilated pulmonary artery suggestive of pulmonary hypertension. Pulmonary veins: Normal pulmonary venous drainage. Pericardium: Normal thickness with no significant effusion or calcium present. Mitral Valve: The mitral valve is normal structure without significant calcification. Extra-cardiac findings: See attached radiology report for non-cardiac structures. IMPRESSION: 1. Annular measurements favor a 26 mm S3 or 29 mm Evolut Pro. 2. No significant annular or subannular calcifications. 3. Sufficient coronary to annulus distance. 4. Optimal Fluoroscopic Angle for Delivery: LAO 21 CAU 13 5. 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 Gerri SporeWesley T. Flora Lipps'Neal, MD Electronically Signed: By: Lennie OdorWesley  O'Neal M.D. On: 03/10/2022 16:22   CT ANGIO ABDOMEN PELVIS  W &/OR WO CONTRAST  Result Date: 03/09/2022 CLINICAL DATA:  82 year old male with history of severe aortic stenosis. Preprocedural study prior to potential transcatheter aortic valve replacement (TAVR) procedure. EXAM: CT ANGIOGRAPHY CHEST, ABDOMEN AND PELVIS TECHNIQUE: Multidetector CT imaging through the chest, abdomen and pelvis was performed using the standard protocol during bolus administration of intravenous contrast. Multiplanar reconstructed images and MIPs were obtained  and reviewed to evaluate the vascular anatomy. RADIATION DOSE REDUCTION: This exam was performed according to the departmental dose-optimization program which includes automated exposure control, adjustment of the mA and/or kV according to patient size and/or use of iterative reconstruction technique. CONTRAST:  100mL OMNIPAQUE IOHEXOL 350 MG/ML SOLN COMPARISON:  CT of the abdomen and pelvis 03/26/2021. No prior chest CT. FINDINGS: CTA CHEST FINDINGS Cardiovascular: Heart size is mildly enlarged. There is no significant pericardial fluid, thickening or pericardial calcification. There is aortic atherosclerosis, as well as atherosclerosis of the great vessels of the mediastinum and the coronary arteries, including calcified atherosclerotic plaque in the left anterior descending, left circumflex and right coronary arteries. Severe thickening and calcification of the aortic valve. Dilatation of the pulmonic trunk (3.7 cm in  diameter). Mediastinum/Lymph Nodes: No pathologically enlarged mediastinal or hilar lymph nodes. Esophagus is unremarkable in appearance. No axillary lymphadenopathy. Lungs/Pleura: No suspicious appearing pulmonary nodules or masses are noted. No acute consolidative airspace disease. No pleural effusions. Mild linear scarring in the left lung base. Musculoskeletal/Soft Tissues: There are no aggressive appearing lytic or blastic lesions noted in the visualized portions of the skeleton. CTA ABDOMEN AND PELVIS FINDINGS Hepatobiliary: Diffuse low attenuation throughout the hepatic parenchyma, indicative of a background of hepatic steatosis. Multiple subcentimeter low-attenuation lesions in the liver, too small to definitively characterize, but statistically likely to represent tiny cysts. Larger low-attenuation lesions are compatible with simple cysts, measuring up to 1.4 cm in diameter in the periphery of segment 2 (no imaging follow-up for any of these lesions is recommended). No aggressive appearing  hepatic lesions. No intra or extrahepatic biliary ductal dilatation. Gallbladder is moderately distended, but otherwise unremarkable in appearance. Pancreas: No pancreatic mass. No pancreatic ductal dilatation. No pancreatic or peripancreatic fluid collections or inflammatory changes. Spleen: Unremarkable. Adrenals/Urinary Tract: Multiple low-attenuation lesions in both kidneys, many of which are too small to definitively characterize, but statistically likely represent small simple cysts (no imaging follow-up recommended). Larger lesions are compatible with simple cysts, measuring up to 1.6 cm in diameter in the posterior aspect of the interpolar region of the right kidney. In addition, there is an exophytic high attenuation (56 HU) lesion extending off the anterior aspect of the lower pole of the left kidney measuring 2 cm in diameter (axial image 142 of series 7), minimally increased compared to prior CT 03/23/2021, considered indeterminate. 5 mm calculus at the left ureteropelvic junction (coronal image 88 of series 9). This is not associated with significant proximal hydronephrosis at this time to suggest urinary tract obstruction. No hydroureter. Gas present non dependently in the lumen of the urinary bladder. Urinary bladder wall appears thickened and mildly trabeculated with mild diffuse enhancement of the urothelium. Extending cephalad from the superior aspect of the urinary bladder on axial images 181-195 of series 7 and sagittal images 127-136 of series 10 there is some gas which extends in close proximity to the adjacent sigmoid colon such that direct communication between the colon and the urinary bladder lumen is not entirely excluded. Stomach/Bowel: The appearance of the stomach is normal. No pathologic dilatation of small bowel or colon. Numerous colonic diverticuli are noted. No definite surrounding inflammatory changes are noted at this time to suggest an acute diverticulitis. However, previously  noted diverticular abscess cavity has contracted, but is now intimately associated with the superior aspect of the urinary bladder and the adjacent sigmoid colon such that colovesical fistula is not excluded (see discussion above). Normal appendix. Vascular/Lymphatic: Aortic atherosclerosis, with vascular findings and measurements pertinent to potential TAVR procedure, as detailed below. No lymphadenopathy noted in the abdomen or pelvis. Reproductive: Prostate gland and seminal vesicles are unremarkable in appearance. Other: No significant volume of ascites.  No pneumoperitoneum. Musculoskeletal: There are no aggressive appearing lytic or blastic lesions noted in the visualized portions of the skeleton. VASCULAR MEASUREMENTS PERTINENT TO TAVR: AORTA: Minimal Aortic Diameter-14 x 16 mm Severity of Aortic Calcification-moderate to severe RIGHT PELVIS: Right Common Iliac Artery - Minimal Diameter-9.0 x 7.8 mm Tortuosity-mild Calcification-moderate to severe Right External Iliac Artery - Minimal Diameter-8.5 x 6.4 mm Tortuosity - mild Calcification-minimal Right Common Femoral Artery - Minimal Diameter-6.6 x 8.3 mm Tortuosity - mild Calcification-mild LEFT PELVIS: Left Common Iliac Artery - Minimal Diameter-10.5 x 9.9 mm Tortuosity - mild Calcification-moderate to  severe Left External Iliac Artery - Minimal Diameter-7.2 x 6.7 mm Tortuosity - mild Calcification-minimal Left Common Femoral Artery - Minimal Diameter-8.9 x 9.2 mm Tortuosity - mild Calcification-minimal Review of the MIP images confirms the above findings. IMPRESSION: 1. Vascular findings and measurements pertinent to potential TAVR procedure, as detailed above. 2. Severe thickening and calcification of the aortic valve, compatible with reported clinical history of severe aortic stenosis. 3. Gas present within the lumen of the urinary bladder, with mild diffuse urothelial enhancement and potential colovesical fistula, as detailed above. Previously noted  diverticular abscess cavity appears partially contracted, however, the likelihood of residual fistulous tract is high given the above findings. Urologic consultation is recommended. 4. 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 5. Multiple renal lesions bilaterally, including an indeterminate high attenuation lesion extending exophytically from the anterior aspect of the lower pole of the left kidney. Follow-up nonemergent outpatient abdominal MRI with and without IV gadolinium should be considered in the near future to provide definitive characterization and exclude neoplasm. 6. 5 mm calculus at the left ureteropelvic junction. No proximal hydronephrosis to indicate urinary tract obstruction at this time. 7. Aortic atherosclerosis, in addition to three-vessel coronary artery disease. 8. 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 9. Hepatic steatosis. 10. Additional incidental findings, as above. Electronically Signed   By: Trudie Reed M.D.   On: 03/09/2022 09:26   CT ANGIO CHEST AORTA W/CM & OR WO/CM  Result Date: 03/09/2022 CLINICAL DATA:  82 year old male with history of severe aortic stenosis. Preprocedural study prior to potential transcatheter aortic valve replacement (TAVR) procedure. EXAM: CT ANGIOGRAPHY CHEST, ABDOMEN AND PELVIS TECHNIQUE: Multidetector CT imaging through the chest, abdomen and pelvis was performed using the standard protocol during bolus administration of intravenous contrast. Multiplanar reconstructed images and MIPs were obtained and reviewed to evaluate the vascular anatomy. RADIATION DOSE REDUCTION: This exam was performed according to the departmental dose-optimization program which includes automated exposure control, adjustment of the mA and/or kV according to  patient size and/or use of iterative reconstruction technique. CONTRAST:  OMNIPAQUE IOHEXOL 350 MG/ML SOLN COMPARISON:  CT of the abdomen and pelvis 03/26/2021. No prior chest CT. FINDINGS: CTA CHEST FINDINGS Cardiovascular: Heart size is mildly enlarged. There is no significant pericardial fluid, thickening or pericardial calcification. There is aortic atherosclerosis, as well as atherosclerosis of the great vessels of the mediastinum and the coronary arteries, including calcified atherosclerotic plaque in the left anterior descending, left circumflex and right coronary arteries. Severe thickening and calcification of the aortic valve. Dilatation of the pulmonic trunk (3.7 cm in diameter). Mediastinum/Lymph Nodes: No pathologically enlarged mediastinal or hilar lymph nodes. Esophagus is unremarkable in appearance. No axillary lymphadenopathy. Lungs/Pleura: No suspicious appearing pulmonary nodules or masses are noted. No acute consolidative airspace disease. No pleural effusions. Mild linear scarring in the left lung base. Musculoskeletal/Soft Tissues: There are no aggressive appearing lytic or blastic lesions noted in the visualized portions of the skeleton. CTA ABDOMEN AND PELVIS FINDINGS Hepatobiliary: Diffuse low attenuation throughout the hepatic parenchyma, indicative of a background of hepatic steatosis. Multiple subcentimeter low-attenuation lesions in the liver, too small to definitively characterize, but statistically likely to represent tiny cysts. Larger low-attenuation lesions are compatible with simple cysts, measuring up to 1.4 cm in diameter in the periphery of segment 2 (no imaging follow-up for any of these lesions is  recommended). No aggressive appearing hepatic lesions. No intra or extrahepatic biliary ductal dilatation. Gallbladder is moderately distended, but otherwise unremarkable in appearance. Pancreas: No pancreatic mass. No pancreatic ductal dilatation. No pancreatic or  peripancreatic fluid collections or inflammatory changes. Spleen: Unremarkable. Adrenals/Urinary Tract: Multiple low-attenuation lesions in both kidneys, many of which are too small to definitively characterize, but statistically likely represent small simple cysts (no imaging follow-up recommended). Larger lesions are compatible with simple cysts, measuring up to 1.6 cm in diameter in the posterior aspect of the interpolar region of the right kidney. In addition, there is an exophytic high attenuation (56 HU) lesion extending off the anterior aspect of the lower pole of the left kidney measuring 2 cm in diameter (axial image 142 of series 7), minimally increased compared to prior CT 03/23/2021, considered indeterminate. 5 mm calculus at the left ureteropelvic junction (coronal image 88 of series 9). This is not associated with significant proximal hydronephrosis at this time to suggest urinary tract obstruction. No hydroureter. Gas present non dependently in the lumen of the urinary bladder. Urinary bladder wall appears thickened and mildly trabeculated with mild diffuse enhancement of the urothelium. Extending cephalad from the superior aspect of the urinary bladder on axial images 181-195 of series 7 and sagittal images 127-136 of series 10 there is some gas which extends in close proximity to the adjacent sigmoid colon such that direct communication between the colon and the urinary bladder lumen is not entirely excluded. Stomach/Bowel: The appearance of the stomach is normal. No pathologic dilatation of small bowel or colon. Numerous colonic diverticuli are noted. No definite surrounding inflammatory changes are noted at this time to suggest an acute diverticulitis. However, previously noted diverticular abscess cavity has contracted, but is now intimately associated with the superior aspect of the urinary bladder and the adjacent sigmoid colon such that colovesical fistula is not excluded (see discussion  above). Normal appendix. Vascular/Lymphatic: Aortic atherosclerosis, with vascular findings and measurements pertinent to potential TAVR procedure, as detailed below. No lymphadenopathy noted in the abdomen or pelvis. Reproductive: Prostate gland and seminal vesicles are unremarkable in appearance. Other: No significant volume of ascites.  No pneumoperitoneum. Musculoskeletal: There are no aggressive appearing lytic or blastic lesions noted in the visualized portions of the skeleton. VASCULAR MEASUREMENTS PERTINENT TO TAVR: AORTA: Minimal Aortic Diameter-14 x 16 mm Severity of Aortic Calcification-moderate to severe RIGHT PELVIS: Right Common Iliac Artery - Minimal Diameter-9.0 x 7.8 mm Tortuosity-mild Calcification-moderate to severe Right External Iliac Artery - Minimal Diameter-8.5 x 6.4 mm Tortuosity - mild Calcification-minimal Right Common Femoral Artery - Minimal Diameter-6.6 x 8.3 mm Tortuosity - mild Calcification-mild LEFT PELVIS: Left Common Iliac Artery - Minimal Diameter-10.5 x 9.9 mm Tortuosity - mild Calcification-moderate to severe Left External Iliac Artery - Minimal Diameter-7.2 x 6.7 mm Tortuosity - mild Calcification-minimal Left Common Femoral Artery - Minimal Diameter-8.9 x 9.2 mm Tortuosity - mild Calcification-minimal Review of the MIP images confirms the above findings. IMPRESSION: 1. Vascular findings and measurements pertinent to potential TAVR procedure, as detailed above. 2. Severe thickening and calcification of the aortic valve, compatible with reported clinical history of severe aortic stenosis. 3. Gas present within the lumen of the urinary bladder, with mild diffuse urothelial enhancement and potential colovesical fistula, as detailed above. Previously noted diverticular abscess cavity appears partially contracted, however, the likelihood of residual fistulous tract is high given the above findings. Urologic consultation is recommended. 4. Colonic diverticulosis with partial  resolution of previously noted diverticular abscess (and possible residual colovesical  fistula), but no findings to suggest an acute cholecystitis at this time. 5. Multiple renal lesions bilaterally, including an indeterminate high attenuation lesion extending exophytically from the anterior aspect of the lower pole of the left kidney. Follow-up nonemergent outpatient abdominal MRI with and without IV gadolinium should be considered in the near future to provide definitive characterization and exclude neoplasm. 6. 5 mm calculus at the left ureteropelvic junction. No proximal hydronephrosis to indicate urinary tract obstruction at this time. 7. Aortic atherosclerosis, in addition to three-vessel coronary artery disease. 8. 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 9. Hepatic steatosis. 10. Additional incidental findings, as above. Electronically Signed   By: Vinnie Langton M.D.   On: 03/09/2022 09:26   CARDIAC CATHETERIZATION  Result Date: 02/20/2022 1.  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 2.  Capacious iliofemoral vessels of the right side. 3.  Normal cardiac output of 7 L/min and cardiac index of 3.6 L/min/m with mean RA pressure of 3 mmHg and mean wedge pressure of 10 mmHg. Recommendation: Continue evaluation for aortic valve intervention.       I have independently reviewed the above radiology studies  and reviewed the findings with the patient.   Recent Lab Findings: Lab Results  Component Value Date   WBC 7.8 02/13/2022   HGB 12.9 (L) 02/20/2022   HCT 38.0 (L) 02/20/2022   PLT 165 02/13/2022   GLUCOSE 89 02/13/2022   CHOL 121 01/16/2021   TRIG 172 (H) 01/16/2021   HDL 44 01/16/2021   LDLCALC 48 01/16/2021   ALT 14 01/16/2021   AST 20 01/16/2021   NA 146 (H) 02/20/2022   K 4.4 02/20/2022   CL 105 02/13/2022   CREATININE 1.37 (H) 02/13/2022   BUN 34 (H) 02/13/2022   CO2 22 02/13/2022      Assessment / Plan:   Lan Mcneill 82 y.o. male referred in consultation for transcatheter aortic valve implantation.  + Severe, symptomatic aortic stenosis.  Meets criteria for AV replacement.  STS risk is 3.9% He does have moderate bilateral carotid stenosis and it is at increased risk for periprocedural stroke as well.   Risks/benefits/alternatives of TAVR were discussed at length (90% standard recovery, 6-9% morbidity [any organ, typically access site vascular complication needing surgery, stroke, or pacemaker] and <1% mortality. Options are TAVR, SAVR or medical treatment. Patient understands SAVR is typically higher risk and takes longer recovery than TAVR in elderly patients. As well, discussed that medical Tx yields around 50% mortality for severe, symptomatic aortic stenosis in 2 years if left untreated, and this is an option.       Valve: 26 Sapien (Area [id number]) Bailout:  No (age) Access:  Transfemoral (min access diameter bilateral is 8.70mm) NYHA:II    Misc/procedural: None - scheduled for Tuesday  Tracy 03/20/2022 3:10 PM

## 2022-03-21 ENCOUNTER — Institutional Professional Consult (permissible substitution): Payer: Medicare HMO | Admitting: Cardiothoracic Surgery

## 2022-03-21 ENCOUNTER — Encounter: Payer: Self-pay | Admitting: Cardiothoracic Surgery

## 2022-03-21 VITALS — BP 176/75 | HR 89 | Resp 20 | Ht 66.0 in | Wt 195.0 lb

## 2022-03-21 DIAGNOSIS — I350 Nonrheumatic aortic (valve) stenosis: Secondary | ICD-10-CM | POA: Diagnosis not present

## 2022-03-22 ENCOUNTER — Telehealth: Payer: Self-pay | Admitting: Physician Assistant

## 2022-03-22 ENCOUNTER — Other Ambulatory Visit: Payer: Self-pay | Admitting: Physician Assistant

## 2022-03-22 ENCOUNTER — Encounter (HOSPITAL_COMMUNITY)
Admission: RE | Admit: 2022-03-22 | Discharge: 2022-03-22 | Disposition: A | Discharge status: Home / Self Care | Payer: Medicare HMO | Source: Ambulatory Visit | Attending: Internal Medicine | Admitting: Internal Medicine

## 2022-03-22 ENCOUNTER — Ambulatory Visit (HOSPITAL_COMMUNITY)
Admission: RE | Admit: 2022-03-22 | Discharge: 2022-03-22 | Disposition: A | Discharge status: Home / Self Care | Payer: Medicare HMO | Source: Ambulatory Visit | Attending: Internal Medicine | Admitting: Internal Medicine

## 2022-03-22 DIAGNOSIS — Z1152 Encounter for screening for COVID-19: Secondary | ICD-10-CM | POA: Diagnosis not present

## 2022-03-22 DIAGNOSIS — I491 Atrial premature depolarization: Secondary | ICD-10-CM | POA: Insufficient documentation

## 2022-03-22 DIAGNOSIS — Z01818 Encounter for other preprocedural examination: Secondary | ICD-10-CM | POA: Diagnosis not present

## 2022-03-22 DIAGNOSIS — I350 Nonrheumatic aortic (valve) stenosis: Secondary | ICD-10-CM | POA: Insufficient documentation

## 2022-03-22 DIAGNOSIS — R8271 Bacteriuria: Secondary | ICD-10-CM

## 2022-03-22 LAB — CBC
HCT: 44 % (ref 39.0–52.0)
Hemoglobin: 13.8 g/dL (ref 13.0–17.0)
MCH: 28.8 pg (ref 26.0–34.0)
MCHC: 31.4 g/dL (ref 30.0–36.0)
MCV: 91.7 fL (ref 80.0–100.0)
Platelets: 161 10*3/uL (ref 150–400)
RBC: 4.8 MIL/uL (ref 4.22–5.81)
RDW: 15.8 % — ABNORMAL HIGH (ref 11.5–15.5)
WBC: 9.6 10*3/uL (ref 4.0–10.5)
nRBC: 0 % (ref 0.0–0.2)

## 2022-03-22 LAB — URINALYSIS, ROUTINE W REFLEX MICROSCOPIC
Bilirubin Urine: NEGATIVE
Glucose, UA: NEGATIVE mg/dL
Hgb urine dipstick: NEGATIVE
Ketones, ur: NEGATIVE mg/dL
Nitrite: NEGATIVE
Protein, ur: NEGATIVE mg/dL
Specific Gravity, Urine: 1.006 (ref 1.005–1.030)
pH: 5 (ref 5.0–8.0)

## 2022-03-22 LAB — COMPREHENSIVE METABOLIC PANEL
ALT: 23 U/L (ref 0–44)
AST: 27 U/L (ref 15–41)
Albumin: 4.3 g/dL (ref 3.5–5.0)
Alkaline Phosphatase: 80 U/L (ref 38–126)
Anion gap: 10 (ref 5–15)
BUN: 46 mg/dL — ABNORMAL HIGH (ref 8–23)
CO2: 27 mmol/L (ref 22–32)
Calcium: 11 mg/dL — ABNORMAL HIGH (ref 8.9–10.3)
Chloride: 107 mmol/L (ref 98–111)
Creatinine, Ser: 1.62 mg/dL — ABNORMAL HIGH (ref 0.61–1.24)
GFR, Estimated: 42 mL/min — ABNORMAL LOW (ref >60)
Glucose, Bld: 101 mg/dL — ABNORMAL HIGH (ref 70–99)
Potassium: 4.8 mmol/L (ref 3.5–5.1)
Sodium: 144 mmol/L (ref 135–145)
Total Bilirubin: 0.5 mg/dL (ref 0.3–1.2)
Total Protein: 7.5 g/dL (ref 6.5–8.1)

## 2022-03-22 LAB — PROTIME-INR
INR: 1 (ref 0.8–1.2)
Prothrombin Time: 13.1 seconds (ref 11.4–15.2)

## 2022-03-22 LAB — SURGICAL PCR SCREEN
MRSA, PCR: NEGATIVE
Staphylococcus aureus: NEGATIVE

## 2022-03-22 LAB — TYPE AND SCREEN
ABO/RH(D): O POS
Antibody Screen: NEGATIVE

## 2022-03-22 LAB — SARS CORONAVIRUS 2 (TAT 6-24 HRS): SARS Coronavirus 2: NEGATIVE

## 2022-03-22 MED ORDER — CIPROFLOXACIN HCL 500 MG PO TABS: 500.0000 mg | ORAL_TABLET | Freq: Two times a day (BID) | ORAL | 0 refills | Status: AC

## 2022-03-22 NOTE — Progress Notes (Signed)
TAVR letter instructions reviewed with patient and all questions answered. CHG Soap given. 

## 2022-03-22 NOTE — Telephone Encounter (Signed)
  [facility]   MULTIDISCIPLINARY HEART VALVE TEAM  Pt scheduled for PAT labs in anticipation of TAVR 03/26/22. UA was abnormal with many leukocytes and bacteria. Pre TAVR CT done on 12/29 showed gas in urinary bladder with possible colovesical fistula as well as 71mm stone at ureteropelvic junction. I called Dr. Louis Meckel, the urologist on call today and asked for recommendations.   Tough situation as pt will require a cystoscopy and general surgery consult for possible fistula but has severe AS which complicates anesthesia. We will empirically treat with Cipro, which has been called into his pharmacy. I have added a urine culture to the UA done today. As long as culture comes back sensitive to Cipro we can proceed on with TAVR. If it is resistant, we will need to cancel TAVR and place him on appropriate suppressive Abx to mitigate infection risk. Dr Louis Meckel will ask his schedulers to have him seen by first available in their office in the near future.   Angelena Form PA-C  [facility]

## 2022-03-22 NOTE — Addendum Note (Signed)
Addended by: Eileen Stanford on: 03/22/2022 03:13 PM   Modules accepted: Orders

## 2022-03-22 NOTE — Telephone Encounter (Signed)
Update: spoke with wife and she is going to get cipro now and start tonight. They know that we will call on Monday with final plan after urine culture results. The pt actually has an extensive history that we do not have access to in Epic. Pt had fistula surgery prior to 2000 in [facility] by a Dr Nathaneil Canary. Pt also had perforated colon in Jan 2023 at [facility] and this did not require surgical treatment. Hx of 18 kidney stones and he knows when one is moving. The pt is not having any symptoms at this time that is out of the ordinary from his baseline, from a urologic standpoint.

## 2022-03-23 ENCOUNTER — Other Ambulatory Visit: Payer: Self-pay | Admitting: Physician Assistant

## 2022-03-23 DIAGNOSIS — I350 Nonrheumatic aortic (valve) stenosis: Secondary | ICD-10-CM

## 2022-03-23 DIAGNOSIS — R8271 Bacteriuria: Secondary | ICD-10-CM

## 2022-03-25 LAB — URINE CULTURE: Culture: 90000 — AB

## 2022-03-25 MED ORDER — CEFAZOLIN SODIUM-DEXTROSE 2-4 GM/100ML-% IV SOLN
2.0000 g | INTRAVENOUS | Status: AC
  Administered 2022-03-26: 2 g via INTRAVENOUS
  Filled 2022-03-25: qty 100

## 2022-03-25 MED ORDER — DEXMEDETOMIDINE HCL IN NACL 400 MCG/100ML IV SOLN
0.1000 ug/kg/h | INTRAVENOUS | Status: DC
  Filled 2022-03-25: qty 100

## 2022-03-25 MED ORDER — HEPARIN 30,000 UNITS/1000 ML (OHS) CELLSAVER SOLUTION
Status: DC
  Filled 2022-03-25: qty 1000

## 2022-03-25 MED ORDER — MAGNESIUM SULFATE 50 % IJ SOLN
40.0000 meq | INTRAMUSCULAR | Status: DC
  Filled 2022-03-25: qty 9.85

## 2022-03-25 MED ORDER — CEFAZOLIN SODIUM-DEXTROSE 2-4 GM/100ML-% IV SOLN: 2.0000 g | INTRAVENOUS | Status: DC

## 2022-03-25 MED ORDER — POTASSIUM CHLORIDE 2 MEQ/ML IV SOLN
80.0000 meq | INTRAVENOUS | Status: DC
  Filled 2022-03-25: qty 40

## 2022-03-25 MED ORDER — DEXMEDETOMIDINE HCL IN NACL 400 MCG/100ML IV SOLN
0.1000 ug/kg/h | INTRAVENOUS | Status: AC
  Administered 2022-03-26: 6 ug/kg/h via INTRAVENOUS
  Filled 2022-03-25: qty 100

## 2022-03-25 MED ORDER — NOREPINEPHRINE 4 MG/250ML-% IV SOLN
0.0000 ug/min | INTRAVENOUS | Status: DC
  Filled 2022-03-25: qty 250

## 2022-03-25 NOTE — Telephone Encounter (Signed)
Urine culture resulted. Ecoli is sensitive to Cipro. Will keep him on this and plan for TAVR tomorrow. He will remain on Cirpo until he can get into urology for consultation.

## 2022-03-26 ENCOUNTER — Inpatient Hospital Stay (HOSPITAL_COMMUNITY): Payer: Medicare HMO | Admitting: Physician Assistant

## 2022-03-26 ENCOUNTER — Other Ambulatory Visit: Payer: Self-pay | Admitting: Physician Assistant

## 2022-03-26 ENCOUNTER — Inpatient Hospital Stay (HOSPITAL_COMMUNITY)
Admission: RE | Admit: 2022-03-26 | Discharge: 2022-03-27 | DRG: 267 | Disposition: A | Discharge status: Home / Self Care | Payer: Medicare HMO | Attending: Internal Medicine | Admitting: Internal Medicine

## 2022-03-26 ENCOUNTER — Other Ambulatory Visit: Payer: Self-pay

## 2022-03-26 ENCOUNTER — Encounter (HOSPITAL_COMMUNITY): Payer: Self-pay | Admitting: Internal Medicine

## 2022-03-26 ENCOUNTER — Inpatient Hospital Stay (HOSPITAL_COMMUNITY): Payer: Medicare HMO

## 2022-03-26 ENCOUNTER — Encounter (HOSPITAL_COMMUNITY)
Admission: RE | Disposition: A | Discharge status: Home / Self Care | Payer: Self-pay | Source: Home / Self Care | Attending: Internal Medicine

## 2022-03-26 DIAGNOSIS — Z952 Presence of prosthetic heart valve: Secondary | ICD-10-CM

## 2022-03-26 DIAGNOSIS — Z6830 Body mass index (BMI) 30.0-30.9, adult: Secondary | ICD-10-CM

## 2022-03-26 DIAGNOSIS — H3530 Unspecified macular degeneration: Secondary | ICD-10-CM | POA: Diagnosis present

## 2022-03-26 DIAGNOSIS — I6523 Occlusion and stenosis of bilateral carotid arteries: Secondary | ICD-10-CM | POA: Diagnosis present

## 2022-03-26 DIAGNOSIS — E213 Hyperparathyroidism, unspecified: Secondary | ICD-10-CM | POA: Diagnosis present

## 2022-03-26 DIAGNOSIS — Z809 Family history of malignant neoplasm, unspecified: Secondary | ICD-10-CM

## 2022-03-26 DIAGNOSIS — I350 Nonrheumatic aortic (valve) stenosis: Principal | ICD-10-CM

## 2022-03-26 DIAGNOSIS — I25119 Atherosclerotic heart disease of native coronary artery with unspecified angina pectoris: Secondary | ICD-10-CM | POA: Diagnosis present

## 2022-03-26 DIAGNOSIS — N183 Chronic kidney disease, stage 3 unspecified: Secondary | ICD-10-CM | POA: Diagnosis present

## 2022-03-26 DIAGNOSIS — N1832 Chronic kidney disease, stage 3b: Secondary | ICD-10-CM | POA: Diagnosis present

## 2022-03-26 DIAGNOSIS — Z8673 Personal history of transient ischemic attack (TIA), and cerebral infarction without residual deficits: Secondary | ICD-10-CM | POA: Diagnosis not present

## 2022-03-26 DIAGNOSIS — N400 Enlarged prostate without lower urinary tract symptoms: Secondary | ICD-10-CM | POA: Diagnosis present

## 2022-03-26 DIAGNOSIS — I2510 Atherosclerotic heart disease of native coronary artery without angina pectoris: Secondary | ICD-10-CM | POA: Diagnosis present

## 2022-03-26 DIAGNOSIS — E785 Hyperlipidemia, unspecified: Secondary | ICD-10-CM | POA: Diagnosis present

## 2022-03-26 DIAGNOSIS — Z8616 Personal history of COVID-19: Secondary | ICD-10-CM | POA: Diagnosis not present

## 2022-03-26 DIAGNOSIS — E669 Obesity, unspecified: Secondary | ICD-10-CM | POA: Diagnosis present

## 2022-03-26 DIAGNOSIS — I6529 Occlusion and stenosis of unspecified carotid artery: Secondary | ICD-10-CM | POA: Diagnosis present

## 2022-03-26 DIAGNOSIS — D638 Anemia in other chronic diseases classified elsewhere: Secondary | ICD-10-CM | POA: Diagnosis present

## 2022-03-26 DIAGNOSIS — N1831 Chronic kidney disease, stage 3a: Secondary | ICD-10-CM | POA: Diagnosis present

## 2022-03-26 DIAGNOSIS — I10 Essential (primary) hypertension: Secondary | ICD-10-CM | POA: Diagnosis not present

## 2022-03-26 DIAGNOSIS — Z833 Family history of diabetes mellitus: Secondary | ICD-10-CM

## 2022-03-26 DIAGNOSIS — Z7982 Long term (current) use of aspirin: Secondary | ICD-10-CM | POA: Diagnosis not present

## 2022-03-26 DIAGNOSIS — Z79899 Other long term (current) drug therapy: Secondary | ICD-10-CM | POA: Diagnosis not present

## 2022-03-26 DIAGNOSIS — I5022 Chronic systolic (congestive) heart failure: Secondary | ICD-10-CM | POA: Diagnosis present

## 2022-03-26 DIAGNOSIS — I130 Hypertensive heart and chronic kidney disease with heart failure and stage 1 through stage 4 chronic kidney disease, or unspecified chronic kidney disease: Secondary | ICD-10-CM | POA: Diagnosis present

## 2022-03-26 DIAGNOSIS — Z8249 Family history of ischemic heart disease and other diseases of the circulatory system: Secondary | ICD-10-CM

## 2022-03-26 DIAGNOSIS — Z006 Encounter for examination for normal comparison and control in clinical research program: Secondary | ICD-10-CM

## 2022-03-26 DIAGNOSIS — Z888 Allergy status to other drugs, medicaments and biological substances status: Secondary | ICD-10-CM | POA: Diagnosis not present

## 2022-03-26 HISTORY — PX: TRANSCATHETER AORTIC VALVE REPLACEMENT, TRANSFEMORAL: SHX6400

## 2022-03-26 HISTORY — PX: INTRAOPERATIVE TRANSTHORACIC ECHOCARDIOGRAM: SHX6523

## 2022-03-26 HISTORY — DX: Presence of prosthetic heart valve: Z95.2

## 2022-03-26 LAB — POCT I-STAT, CHEM 8
BUN: 30 mg/dL — ABNORMAL HIGH (ref 8–23)
BUN: 33 mg/dL — ABNORMAL HIGH (ref 8–23)
Calcium, Ion: 1.34 mmol/L (ref 1.15–1.40)
Calcium, Ion: 1.36 mmol/L (ref 1.15–1.40)
Chloride: 108 mmol/L (ref 98–111)
Chloride: 110 mmol/L (ref 98–111)
Creatinine, Ser: 1.4 mg/dL — ABNORMAL HIGH (ref 0.61–1.24)
Creatinine, Ser: 1.6 mg/dL — ABNORMAL HIGH (ref 0.61–1.24)
Glucose, Bld: 126 mg/dL — ABNORMAL HIGH (ref 70–99)
Glucose, Bld: 100 mg/dL — ABNORMAL HIGH (ref 70–99)
HCT: 35 % — ABNORMAL LOW (ref 39.0–52.0)
HCT: 31 % — ABNORMAL LOW (ref 39.0–52.0)
Hemoglobin: 11.9 g/dL — ABNORMAL LOW (ref 13.0–17.0)
Hemoglobin: 10.5 g/dL — ABNORMAL LOW (ref 13.0–17.0)
Potassium: 3.9 mmol/L (ref 3.5–5.1)
Potassium: 4.3 mmol/L (ref 3.5–5.1)
Sodium: 146 mmol/L — ABNORMAL HIGH (ref 135–145)
Sodium: 144 mmol/L (ref 135–145)
TCO2: 27 mmol/L (ref 22–32)
TCO2: 27 mmol/L (ref 22–32)

## 2022-03-26 LAB — BLOOD GAS, ARTERIAL
Acid-Base Excess: 4.1 mmol/L — ABNORMAL HIGH (ref 0.0–2.0)
Bicarbonate: 28.5 mmol/L — ABNORMAL HIGH (ref 20.0–28.0)
O2 Saturation: 98.8 %
Patient temperature: 37
pCO2 arterial: 41 mmHg (ref 32–48)
pH, Arterial: 7.45 (ref 7.35–7.45)
pO2, Arterial: 89 mmHg (ref 83–108)

## 2022-03-26 LAB — ECHOCARDIOGRAM LIMITED
AR max vel: 2.86 cm2
AV Area VTI: 3.28 cm2
AV Area mean vel: 3.31 cm2
AV Mean grad: 4 mmHg
AV Peak grad: 8.5 mmHg
Ao pk vel: 1.46 m/s
Est EF: 50

## 2022-03-26 LAB — POCT I-STAT 7, (LYTES, BLD GAS, ICA,H+H)
Acid-Base Excess: 1 mmol/L (ref 0.0–2.0)
Bicarbonate: 26.4 mmol/L (ref 20.0–28.0)
Calcium, Ion: 1.37 mmol/L (ref 1.15–1.40)
HCT: 34 % — ABNORMAL LOW (ref 39.0–52.0)
Hemoglobin: 11.6 g/dL — ABNORMAL LOW (ref 13.0–17.0)
O2 Saturation: 100 %
Potassium: 3.9 mmol/L (ref 3.5–5.1)
Sodium: 144 mmol/L (ref 135–145)
TCO2: 28 mmol/L (ref 22–32)
pCO2 arterial: 46.6 mmHg (ref 32–48)
pH, Arterial: 7.362 (ref 7.35–7.45)
pO2, Arterial: 453 mmHg — ABNORMAL HIGH (ref 83–108)

## 2022-03-26 LAB — ABO/RH: ABO/RH(D): O POS

## 2022-03-26 SURGERY — IMPLANTATION, AORTIC VALVE, TRANSCATHETER, FEMORAL APPROACH
Anesthesia: Monitor Anesthesia Care | Site: Chest | Laterality: Right

## 2022-03-26 MED ORDER — HEPARIN 6000 UNIT IRRIGATION SOLUTION
Status: AC
  Filled 2022-03-26: qty 1500

## 2022-03-26 MED ORDER — CHLORHEXIDINE GLUCONATE 0.12 % MT SOLN
15.0000 mL | Freq: Once | OROMUCOSAL | Status: DC
  Filled 2022-03-26: qty 15

## 2022-03-26 MED ORDER — SODIUM CHLORIDE 0.9% FLUSH
3.0000 mL | Freq: Two times a day (BID) | INTRAVENOUS | Status: DC
  Administered 2022-03-26: 3 mL via INTRAVENOUS

## 2022-03-26 MED ORDER — IODIXANOL 320 MG/ML IV SOLN
INTRAVENOUS | Status: DC | PRN
  Administered 2022-03-26: 100 mL

## 2022-03-26 MED ORDER — ACETAMINOPHEN 325 MG PO TABS: 650.0000 mg | ORAL_TABLET | Freq: Four times a day (QID) | ORAL | Status: DC | PRN

## 2022-03-26 MED ORDER — LACTATED RINGERS IV SOLN: INTRAVENOUS | Status: DC

## 2022-03-26 MED ORDER — ACETAMINOPHEN 650 MG RE SUPP: 650.0000 mg | Freq: Four times a day (QID) | RECTAL | Status: DC | PRN

## 2022-03-26 MED ORDER — PROTAMINE SULFATE 10 MG/ML IV SOLN
INTRAVENOUS | Status: AC
  Filled 2022-03-26: qty 5

## 2022-03-26 MED ORDER — ONDANSETRON HCL 4 MG/2ML IJ SOLN: 4.0000 mg | Freq: Four times a day (QID) | INTRAMUSCULAR | Status: DC | PRN

## 2022-03-26 MED ORDER — MORPHINE SULFATE (PF) 2 MG/ML IV SOLN: 1.0000 mg | INTRAVENOUS | Status: DC | PRN

## 2022-03-26 MED ORDER — FINASTERIDE 5 MG PO TABS
5.0000 mg | ORAL_TABLET | Freq: Every day | ORAL | Status: DC
  Administered 2022-03-26: 5 mg via ORAL
  Filled 2022-03-26: qty 1

## 2022-03-26 MED ORDER — CEFAZOLIN SODIUM-DEXTROSE 2-4 GM/100ML-% IV SOLN
2.0000 g | Freq: Three times a day (TID) | INTRAVENOUS | Status: AC
  Administered 2022-03-26 – 2022-03-27 (×2): 2 g via INTRAVENOUS
  Filled 2022-03-26 (×2): qty 100

## 2022-03-26 MED ORDER — NITROGLYCERIN IN D5W 200-5 MCG/ML-% IV SOLN
0.0000 ug/min | INTRAVENOUS | Status: DC
  Filled 2022-03-26: qty 250

## 2022-03-26 MED ORDER — FUROSEMIDE 10 MG/ML IJ SOLN
40.0000 mg | Freq: Once | INTRAMUSCULAR | Status: AC
  Administered 2022-03-26: 40 mg via INTRAVENOUS

## 2022-03-26 MED ORDER — FUROSEMIDE 10 MG/ML IJ SOLN
INTRAMUSCULAR | Status: AC
  Filled 2022-03-26: qty 4

## 2022-03-26 MED ORDER — ASPIRIN 81 MG PO TBEC
81.0000 mg | DELAYED_RELEASE_TABLET | Freq: Every day | ORAL | Status: DC
  Administered 2022-03-26 – 2022-03-27 (×2): 81 mg via ORAL
  Filled 2022-03-26 (×2): qty 1

## 2022-03-26 MED ORDER — SODIUM CHLORIDE 0.9 % IV SOLN: INTRAVENOUS | Status: DC

## 2022-03-26 MED ORDER — PROPOFOL 500 MG/50ML IV EMUL
INTRAVENOUS | Status: DC | PRN
  Administered 2022-03-26: 60 ug/kg/min via INTRAVENOUS

## 2022-03-26 MED ORDER — CHLORHEXIDINE GLUCONATE 4 % EX LIQD: 30.0000 mL | CUTANEOUS | Status: DC

## 2022-03-26 MED ORDER — PROTAMINE SULFATE 10 MG/ML IV SOLN
INTRAVENOUS | Status: DC | PRN
  Administered 2022-03-26: 90 mg via INTRAVENOUS

## 2022-03-26 MED ORDER — TRAMADOL HCL 50 MG PO TABS: 50.0000 mg | ORAL_TABLET | ORAL | Status: DC | PRN

## 2022-03-26 MED ORDER — 0.9 % SODIUM CHLORIDE (POUR BTL) OPTIME
TOPICAL | Status: DC | PRN
  Administered 2022-03-26: 1000 mL

## 2022-03-26 MED ORDER — SODIUM CHLORIDE 0.9% FLUSH: 3.0000 mL | INTRAVENOUS | Status: DC | PRN

## 2022-03-26 MED ORDER — SODIUM CHLORIDE 0.9 % IV SOLN: INTRAVENOUS | Status: AC

## 2022-03-26 MED ORDER — CHLORHEXIDINE GLUCONATE 0.12 % MT SOLN
15.0000 mL | Freq: Once | OROMUCOSAL | Status: AC
  Administered 2022-03-26: 15 mL via OROMUCOSAL

## 2022-03-26 MED ORDER — POTASSIUM CHLORIDE CRYS ER 20 MEQ PO TBCR
10.0000 meq | EXTENDED_RELEASE_TABLET | Freq: Once | ORAL | Status: AC
  Administered 2022-03-26: 10 meq via ORAL

## 2022-03-26 MED ORDER — OXYCODONE HCL 5 MG PO TABS: 5.0000 mg | ORAL_TABLET | ORAL | Status: DC | PRN

## 2022-03-26 MED ORDER — SODIUM CHLORIDE 0.9 % IV SOLN: 250.0000 mL | INTRAVENOUS | Status: DC | PRN

## 2022-03-26 MED ORDER — ONDANSETRON HCL 4 MG/2ML IJ SOLN
INTRAMUSCULAR | Status: DC | PRN
  Administered 2022-03-26: 4 mg via INTRAVENOUS

## 2022-03-26 MED ORDER — LIDOCAINE HCL 1 % IJ SOLN
INTRAMUSCULAR | Status: AC
  Filled 2022-03-26: qty 20

## 2022-03-26 MED ORDER — HEPARIN SODIUM (PORCINE) 1000 UNIT/ML IJ SOLN
INTRAMUSCULAR | Status: DC | PRN
  Administered 2022-03-26: 4000 [IU] via INTRAVENOUS
  Administered 2022-03-26: 14000 [IU] via INTRAVENOUS

## 2022-03-26 MED ORDER — ORAL CARE MOUTH RINSE: 15.0000 mL | Freq: Once | OROMUCOSAL | Status: DC

## 2022-03-26 MED ORDER — LIDOCAINE HCL 1 % IJ SOLN
INTRAMUSCULAR | Status: DC | PRN
  Administered 2022-03-26: 10 mL via INTRADERMAL

## 2022-03-26 MED ORDER — HEPARIN SODIUM (PORCINE) 1000 UNIT/ML IJ SOLN
INTRAMUSCULAR | Status: AC
  Filled 2022-03-26: qty 1

## 2022-03-26 MED ORDER — ESCITALOPRAM OXALATE 10 MG PO TABS
10.0000 mg | ORAL_TABLET | Freq: Every morning | ORAL | Status: DC
  Administered 2022-03-27: 10 mg via ORAL
  Filled 2022-03-26: qty 1

## 2022-03-26 MED ORDER — POTASSIUM CHLORIDE CRYS ER 20 MEQ PO TBCR
EXTENDED_RELEASE_TABLET | ORAL | Status: AC
  Filled 2022-03-26: qty 1

## 2022-03-26 MED ORDER — CHLORHEXIDINE GLUCONATE 4 % EX LIQD: 60.0000 mL | Freq: Once | CUTANEOUS | Status: DC

## 2022-03-26 MED ORDER — PROPOFOL 10 MG/ML IV BOLUS
INTRAVENOUS | Status: DC | PRN
  Administered 2022-03-26: 30 ug via INTRAVENOUS

## 2022-03-26 MED ORDER — AMLODIPINE BESYLATE 5 MG PO TABS
5.0000 mg | ORAL_TABLET | Freq: Every day | ORAL | Status: DC
  Administered 2022-03-26 – 2022-03-27 (×2): 5 mg via ORAL
  Filled 2022-03-26 (×2): qty 1

## 2022-03-26 MED ORDER — CIPROFLOXACIN HCL 500 MG PO TABS
500.0000 mg | ORAL_TABLET | Freq: Two times a day (BID) | ORAL | Status: DC
  Administered 2022-03-26 – 2022-03-27 (×2): 500 mg via ORAL
  Filled 2022-03-26 (×2): qty 1

## 2022-03-26 MED ORDER — LATANOPROST 0.005 % OP SOLN
1.0000 [drp] | Freq: Every day | OPHTHALMIC | Status: DC
  Administered 2022-03-26: 1 [drp] via OPHTHALMIC
  Filled 2022-03-26 (×2): qty 2.5

## 2022-03-26 MED ORDER — HEPARIN 6000 UNIT IRRIGATION SOLUTION
Status: DC | PRN
  Administered 2022-03-26 (×3): 1

## 2022-03-26 MED ORDER — ALLOPURINOL 100 MG PO TABS
100.0000 mg | ORAL_TABLET | Freq: Two times a day (BID) | ORAL | Status: DC
  Administered 2022-03-26 – 2022-03-27 (×2): 100 mg via ORAL
  Filled 2022-03-26 (×2): qty 1

## 2022-03-26 SURGICAL SUPPLY — 73 items
ADH SKN CLS APL DERMABOND .7 (GAUZE/BANDAGES/DRESSINGS) ×2
APL PRP STRL LF DISP 70% ISPRP (MISCELLANEOUS) ×2
BAG COUNTER SPONGE SURGICOUNT (BAG) ×3 IMPLANT
BAG DECANTER FOR FLEXI CONT (MISCELLANEOUS) IMPLANT
BAG SNAP BAND KOVER 36X36 (MISCELLANEOUS) IMPLANT
BAG SPNG CNTER NS LX DISP (BAG) ×2
BLADE CLIPPER SURG (BLADE) IMPLANT
BLADE STERNUM SYSTEM 6 (BLADE) IMPLANT
CABLE ADAPT CONN TEMP 6FT (ADAPTER) ×3 IMPLANT
CABLE SURGICAL S-101-97-12 (CABLE) IMPLANT
CANISTER SUCT 3000ML PPV (MISCELLANEOUS) IMPLANT
CATH DIAG EXPO 6F AL1 (CATHETERS) IMPLANT
CATH DIAG EXPO 6F VENT PIG 145 (CATHETERS) ×6 IMPLANT
CATH S G BIP PACING (CATHETERS) ×3 IMPLANT
CHLORAPREP W/TINT 26 (MISCELLANEOUS) ×3 IMPLANT
CLOSURE MYNX CONTROL 6F/7F (Vascular Products) IMPLANT
CLOSURE PERCLOSE PROSTYLE (VASCULAR PRODUCTS) IMPLANT
CNTNR URN SCR LID CUP LEK RST (MISCELLANEOUS) ×6 IMPLANT
CONT SPEC 4OZ STRL OR WHT (MISCELLANEOUS) ×4
COVER BACK TABLE 80X110 HD (DRAPES) IMPLANT
DERMABOND ADVANCED .7 DNX12 (GAUZE/BANDAGES/DRESSINGS) ×3 IMPLANT
DEVICE CLOSURE PERCLS PRGLD 6F (VASCULAR PRODUCTS) ×6 IMPLANT
DRSG TEGADERM 4X4.75 (GAUZE/BANDAGES/DRESSINGS) ×6 IMPLANT
ELECT REM PT RETURN 9FT ADLT (ELECTROSURGICAL) ×2
ELECTRODE REM PT RTRN 9FT ADLT (ELECTROSURGICAL) ×3 IMPLANT
GAUZE SPONGE 4X4 12PLY STRL (GAUZE/BANDAGES/DRESSINGS) ×3 IMPLANT
GLOVE BIO SURGEON STRL SZ 6.5 (GLOVE) IMPLANT
GLOVE BIO SURGEON STRL SZ7.5 (GLOVE) IMPLANT
GLOVE BIOGEL PI IND STRL 8 (GLOVE) IMPLANT
GLOVE ECLIPSE 7.0 STRL STRAW (GLOVE) ×3 IMPLANT
GOWN STRL REUS W/ TWL LRG LVL3 (GOWN DISPOSABLE) ×3 IMPLANT
GOWN STRL REUS W/ TWL XL LVL3 (GOWN DISPOSABLE) IMPLANT
GOWN STRL REUS W/TWL LRG LVL3 (GOWN DISPOSABLE) ×4
GOWN STRL REUS W/TWL XL LVL3 (GOWN DISPOSABLE) ×4
GUIDEWIRE SAF TJ AMPL .035X180 (WIRE) ×3 IMPLANT
GUIDEWIRE SAFE TJ AMPLATZ EXST (WIRE) ×3 IMPLANT
KIT BASIN OR (CUSTOM PROCEDURE TRAY) ×3 IMPLANT
KIT HEART LEFT (KITS) ×3 IMPLANT
KIT SAPIAN 3 ULTRA RESILIA 26 (Valve) IMPLANT
KIT TURNOVER KIT B (KITS) ×3 IMPLANT
NS IRRIG 1000ML POUR BTL (IV SOLUTION) ×3 IMPLANT
PACK ENDO MINOR (CUSTOM PROCEDURE TRAY) ×3 IMPLANT
PAD ARMBOARD 7.5X6 YLW CONV (MISCELLANEOUS) ×6 IMPLANT
PAD ELECT DEFIB RADIOL ZOLL (MISCELLANEOUS) ×3 IMPLANT
PERCLOSE PROGLIDE 6F (VASCULAR PRODUCTS) ×4
POSITIONER HEAD DONUT 9IN (MISCELLANEOUS) ×3 IMPLANT
SET MICROPUNCTURE 5F STIFF (MISCELLANEOUS) ×3 IMPLANT
SHEATH BRITE TIP 7FR 35CM (SHEATH) ×3 IMPLANT
SHEATH PINNACLE 6F 10CM (SHEATH) ×3 IMPLANT
SHEATH PINNACLE 8F 10CM (SHEATH) ×3 IMPLANT
SLEEVE REPOSITIONING LENGTH 30 (MISCELLANEOUS) ×3 IMPLANT
SPIKE FLUID TRANSFER (MISCELLANEOUS) ×3 IMPLANT
SPONGE T-LAP 18X18 ~~LOC~~+RFID (SPONGE) ×3 IMPLANT
STOPCOCK MORSE 400PSI 3WAY (MISCELLANEOUS) ×6 IMPLANT
SUT ETHIBOND 5 LR DA (SUTURE) IMPLANT
SUT PROLENE 6 0 C 1 30 (SUTURE) IMPLANT
SUT SILK  1 MH (SUTURE)
SUT SILK 1 MH (SUTURE) ×3 IMPLANT
SYR 50ML LL SCALE MARK (SYRINGE) ×3 IMPLANT
SYR BULB IRRIG 60ML STRL (SYRINGE) IMPLANT
SYR MEDRAD MARK V 150ML (SYRINGE) IMPLANT
TOWEL GREEN STERILE (TOWEL DISPOSABLE) ×6 IMPLANT
TRANSDUCER W/STOPCOCK (MISCELLANEOUS) ×6 IMPLANT
TRAY FOLEY SLVR 14FR TEMP STAT (SET/KITS/TRAYS/PACK) IMPLANT
TUBE SUCT INTRACARD DLP 20F (MISCELLANEOUS) IMPLANT
TUBING ART PRESS 72  MALE/FEM (TUBING) ×2
TUBING ART PRESS 72 MALE/FEM (TUBING) IMPLANT
WIRE AMPLATZ SS-J .035X180CM (WIRE) IMPLANT
WIRE EMERALD 3MM-J .035X150CM (WIRE) ×3 IMPLANT
WIRE EMERALD 3MM-J .035X260CM (WIRE) ×3 IMPLANT
WIRE EMERALD ST .035X260CM (WIRE) ×6 IMPLANT
WIRE MICRO SET SILHO 5FR 7 (SHEATH) IMPLANT
WIRE SAFARI SM CURVE 275 (WIRE) IMPLANT

## 2022-03-26 NOTE — Progress Notes (Signed)
Mobility Specialist Progress Note   03/26/22 1900  Mobility  Activity Ambulated with assistance in hallway  Level of Assistance Contact guard assist, steadying assist  Assistive Device  (IV pole)  Distance Ambulated (ft) 220 ft  Activity Response Tolerated well  Mobility Referral Yes  $Mobility charge 1 Mobility   Pre Mobility: 74 HR, 178/81 BP During Mobility: 91 HR Post Mobility: 82 HR, 170/71 BP  Received pt sitting EOB having no complaints and agreeable. No faults during ambulation. Returned back to EOB w/ an elevated BP but pt asymptomatic.Groin sites stable throughout, laid back supine w/ call bell in reach and RN notified.   Holland Falling Mobility Specialist Please contact via SecureChat or  Rehab office at [phone]

## 2022-03-26 NOTE — Anesthesia Preprocedure Evaluation (Signed)
Anesthesia Evaluation  Patient identified by MRN, date of birth, ID band Patient awake    Reviewed: Allergy & Precautions, NPO status , Patient's Chart, lab work & pertinent test results  Airway Mallampati: II  TM Distance: >3 FB Neck ROM: Full    Dental  (+) Dental Advisory Given   Pulmonary neg pulmonary ROS   breath sounds clear to auscultation       Cardiovascular hypertension, Pt. on medications + angina  + CAD  + Valvular Problems/Murmurs AS  Rhythm:Regular Rate:Normal     Neuro/Psych CVA    GI/Hepatic negative GI ROS, Neg liver ROS,,,  Endo/Other  negative endocrine ROS    Renal/GU CRFRenal disease     Musculoskeletal  (+) Arthritis ,    Abdominal   Peds  Hematology  (+) Blood dyscrasia, anemia   Anesthesia Other Findings   Reproductive/Obstetrics                             Anesthesia Physical Anesthesia Plan  ASA: 4  Anesthesia Plan: MAC   Post-op Pain Management: Minimal or no pain anticipated   Induction:   PONV Risk Score and Plan: 1 and Propofol infusion, Ondansetron and Treatment may vary due to age or medical condition  Airway Management Planned: Natural Airway and Simple Face Mask  Additional Equipment:   Intra-op Plan:   Post-operative Plan:   Informed Consent: I have reviewed the patients History and Physical, chart, labs and discussed the procedure including the risks, benefits and alternatives for the proposed anesthesia with the patient or authorized representative who has indicated his/her understanding and acceptance.       Plan Discussed with: CRNA  Anesthesia Plan Comments:        Anesthesia Quick Evaluation

## 2022-03-26 NOTE — H&P (Signed)
[address]       [place] [address]             [phone]                                                   Matthew Mcneil Medical Record #[id number] Date of Birth: Aug 18, 1940   Referring: Richardo Priest, MD Primary Care: Raina Mina., MD Primary Cardiologist: Shirlee More, MD   Chief Complaint:   No chief complaint on file.     History of Present Illness:    Matthew Mcneil 82 y.o. male referred in consultation for transcatheter aortic valve implantation.    Her HPI per cardiology involves " The patient is a 82 y.o. male with the indicated medical history here for recommendations regarding his severe symptomatic aortic stenosis.  He has been followed by Dr. Bettina Gavia for some time.  The patient has noticed mildly increasing shortness of breath with activities outside such as mowing the lawn.  On further query he tells me that this is relatively unchanged versus how he felt within the last few years.  He denies any exertional angina, presyncope, or syncope.  Serial echocardiograms however have demonstrated progression of aortic stenosis to severe with the diminution in ejection fraction.  Fortunately the patient has not developed any heart failure symptoms.  He and his wife who is here today however concerned about the progression of disease and whether this will impact his quality of life later.   The patient recently saw a dentist and was told he has good dental health.   FOCUSED CARDIOVASCULAR PROBLEM LIST:   1.  Hypertension 2.  Hyperlipidemia 3.  Severe symptomatic aortic stenosis with an ejection fraction of 40 to 50% with an aortic valve area of 0.74 cm, mean gradient 33 mmHg, and peak velocity of 4 m/s; EKG with sinus rhythm without bundle-branch blocks 4.  Macular degeneration with vision impairment"   I have independently confirmed increasing symptoms of valvular heart disease           Past Medical History:  Diagnosis  Date   Abnormal ultrasound of carotid artery 01/23/2018   Anemia, chronic disease 04/04/2020   Anxiety 04/29/2016   Atherosclerosis of both carotid arteries 09/15/2018    Formatting of this note might be different from the original. Follows with [facility].   Benign prostate hyperplasia 04/28/2015   Carotid artery occlusion     Chronic idiopathic gout 04/28/2015   Chronic kidney disease, stage III (moderate) ([place]) 04/28/2015   Colon polyp 04/28/2015   Coronary artery disease involving native coronary artery of native heart with angina pectoris ([place]) 05/06/2016   Essential hypertension 04/28/2015   History of COVID-19 05/20/2019    Formatting of this note might be different from the original. Late January 2021.   Hyperlipidemia 04/28/2015   Hyperparathyroidism ([place]) 04/28/2015   Impaired cognition 06/05/2016   Malaise and fatigue 06/04/2016   Mild episode of recurrent major depressive disorder ([place]) 05/16/2016   Nonrheumatic aortic valve stenosis 09/15/2018    Formatting of this note might be different from the original. Follows with [facility].   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[place])  04/03/2019   Venous stasis 09/07/2019   Vitamin B12 deficiency 04/28/2015           Past Surgical History:  Procedure Laterality Date   CARDIAC CATHETERIZATION       CATARACT EXTRACTION, BILATERAL       CORONARY ANGIOPLASTY       HEMORROIDECTOMY       RIGHT/LEFT HEART CATH AND CORONARY ANGIOGRAPHY N/A 02/20/2022    Procedure: RIGHT/LEFT HEART CATH AND CORONARY ANGIOGRAPHY;  Surgeon: [name], MD;  Location: [facility] INVASIVE CV LAB;  Service: Cardiovascular;  Laterality: N/A;           Family History  Problem Relation Age of Onset   Cancer Mother     Heart attack Mother     Hypertension Mother     Diabetes Mother     Hypertension Father     Heart  attack Father     Heart attack Brother     Diabetes Brother          Social History       Tobacco Use  Smoking Status Never  Smokeless Tobacco Never    Social History       Substance and Sexual Activity  Alcohol Use Not Currently             Allergies  Allergen Reactions   Spironolactone Other (See Comments)      Patient was in a panic   Statins Other (See Comments)      Unknown Myalgia              Current Outpatient Medications  Medication Sig Dispense Refill   allopurinol (ZYLOPRIM) 100 MG tablet Take 100 mg by mouth 2 (two) times daily.       amLODipine (NORVASC) 5 MG tablet Take 1 tablet (5 mg total) by mouth daily. 90 tablet 3   Ascorbic Acid (VITAMIN C) 500 MG CAPS Take 500 mg by mouth daily.       aspirin EC 81 MG tablet Take 1 tablet (81 mg total) by mouth daily. 90 tablet 3   escitalopram (LEXAPRO) 10 MG tablet Take 10 mg by mouth daily.       Evolocumab (REPATHA SURECLICK) 140 MG/ML SOAJ Inject 140 mg into the skin every 14 (fourteen) days. 6 mL 3   finasteride (PROSCAR) 5 MG tablet Take 5 mg by mouth daily.       furosemide (LASIX) 40 MG tablet Take 20-40 mg by mouth See admin instructions. Patient states that he takes 40 mg in the morning and 20 mg at night.       latanoprost (XALATAN) 0.005 % ophthalmic solution Place 1 drop into both eyes daily.       Multiple Vitamins-Minerals (PRESERVISION AREDS 2 PO) Take 1 tablet by mouth 2 (two) times daily.       nitroGLYCERIN (NITROSTAT) 0.4 MG SL tablet Place 1 tablet (0.4 mg total) under the tongue every 5 (five) minutes as needed for chest pain. 25 tablet 11   olmesartan (BENICAR) 40 MG tablet Take 40 mg by mouth daily.       Omega-3 Fatty Acids (FISH OIL) 1000 MG CAPS Take 1,000 mg by mouth 2 (two) times daily.       Psyllium (METAMUCIL) 28.3 % POWD Take 10 mLs by mouth daily. 8 oz of Water       vitamin B-12 (CYANOCOBALAMIN) 500 MCG tablet Take 500 mcg by mouth 3 (three) times a week.  No current  facility-administered medications for this visit.      ROS 14 point ROS reviewed and negative except as per HPI     PHYSICAL EXAMINATION: There were no vitals taken for this visit.   Gen: NAD Neuro: Alert and oriented CV: + systolic murmur Resp: Nonlaboured Abd: Soft, ntnd Extr: WWP   Diagnostic Studies & Laboratory data:     Recent Radiology Findings:    Imaging Results  CT CORONARY MORPH W/CTA COR W/SCORE W/CA W/CM &/OR WO/CM   Addendum Date: 03/14/2022   ADDENDUM REPORT: 03/14/2022 06:45 ADDENDUM: Extracardiac findings were described separately under dictation for contemporaneously obtained CTA chest, abdomen and pelvis dated 03/08/2022, please see that report for full description of relevant extracardiac findings. Electronically Signed   By: Trudie Reed M.D.   On: 03/14/2022 06:45    Result Date: 03/14/2022 CLINICAL DATA:  Severe Aortic Stenosis. EXAM: Cardiac TAVR CT TECHNIQUE: A non-contrast, gated CT scan was obtained with axial slices of 3 mm through the heart for aortic valve calcium scoring. A 120 kV retrospective, gated, contrast cardiac scan was obtained. Gantry rotation speed was 250 msecs and collimation was 0.6 mm. Nitroglycerin was not given. The 3D data set was reconstructed in 5% intervals of the 0-95% of the R-R cycle. Systolic and diastolic phases were analyzed on a dedicated workstation using MPR, MIP, and VRT modes. The patient received 100 cc of contrast. FINDINGS: Image quality: Excellent. Noise artifact is: Limited. Valve Morphology: Tricuspid aortic valve with diffuse severe calcifications. Severely restricted leaflet movement in systole. Aortic Valve Calcium score: 1458 Aortic annular dimension: Phase assessed: 15% Annular area: 452 mm2 Annular perimeter: 76.5 mm Max diameter: 26.0 mm Min diameter: 23.0 mm Annular and subannular calcification: None. Membranous septum length: 8.9 mm Optimal coplanar projection: LAO 21 CAU 13 Coronary Artery Height above Annulus:  Left Main: 11.0 mm Right Coronary: 18.7 mm Sinus of Valsalva Measurements: Non-coronary: 32 mm Right-coronary: 31 mm Left-coronary: 33 mm Sinus of Valsalva Height: Non-coronary: 21.2 mm Right-coronary: 22.9 mm Left-coronary: 21.7 mm Sinotubular Junction: 29 mm Ascending Thoracic Aorta: 36 mm Coronary Arteries: Normal coronary origin. Left dominance. The study was performed without use of NTG and is insufficient for plaque evaluation. Please refer to recent cardiac catheterization for coronary assessment. Cardiac Morphology: Right Atrium: Right atrial size is within normal limits. Right Ventricle: The right ventricular cavity is within normal limits. Left Atrium: Left atrial size is normal in size with no left atrial appendage filling defect. Left Ventricle: The ventricular cavity size is within normal limits. Pulmonary arteries: Dilated pulmonary artery suggestive of pulmonary hypertension. Pulmonary veins: Normal pulmonary venous drainage. Pericardium: Normal thickness with no significant effusion or calcium present. Mitral Valve: The mitral valve is normal structure without significant calcification. Extra-cardiac findings: See attached radiology report for non-cardiac structures. IMPRESSION: 1. Annular measurements favor a 26 mm S3 or 29 mm Evolut Pro. 2. No significant annular or subannular calcifications. 3. Sufficient coronary to annulus distance. 4. Optimal Fluoroscopic Angle for Delivery: LAO 21 CAU 13 5. 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 Gerri Spore T. Flora Lipps, MD Electronically Signed: By: Lennie Odor M.D. On: 03/10/2022 16:22    CT ANGIO ABDOMEN PELVIS  W &/OR WO CONTRAST   Result Date: 03/09/2022 CLINICAL DATA:  82 year old male with history of severe aortic stenosis. Preprocedural study prior to potential transcatheter aortic valve replacement (TAVR) procedure. EXAM: CT ANGIOGRAPHY CHEST, ABDOMEN AND PELVIS TECHNIQUE: Multidetector CT imaging through the chest, abdomen and  pelvis was performed using  the standard protocol during bolus administration of intravenous contrast. Multiplanar reconstructed images and MIPs were obtained and reviewed to evaluate the vascular anatomy. RADIATION DOSE REDUCTION: This exam was performed according to the departmental dose-optimization program which includes automated exposure control, adjustment of the mA and/or kV according to patient size and/or use of iterative reconstruction technique. CONTRAST:  OMNIPAQUE IOHEXOL 350 MG/ML SOLN COMPARISON:  CT of the abdomen and pelvis 03/26/2021. No prior chest CT. FINDINGS: CTA CHEST FINDINGS Cardiovascular: Heart size is mildly enlarged. There is no significant pericardial fluid, thickening or pericardial calcification. There is aortic atherosclerosis, as well as atherosclerosis of the great vessels of the mediastinum and the coronary arteries, including calcified atherosclerotic plaque in the left anterior descending, left circumflex and right coronary arteries. Severe thickening and calcification of the aortic valve. Dilatation of the pulmonic trunk (3.7 cm in diameter). Mediastinum/Lymph Nodes: No pathologically enlarged mediastinal or hilar lymph nodes. Esophagus is unremarkable in appearance. No axillary lymphadenopathy. Lungs/Pleura: No suspicious appearing pulmonary nodules or masses are noted. No acute consolidative airspace disease. No pleural effusions. Mild linear scarring in the left lung base. Musculoskeletal/Soft Tissues: There are no aggressive appearing lytic or blastic lesions noted in the visualized portions of the skeleton. CTA ABDOMEN AND PELVIS FINDINGS Hepatobiliary: Diffuse low attenuation throughout the hepatic parenchyma, indicative of a background of hepatic steatosis. Multiple subcentimeter low-attenuation lesions in the liver, too small to definitively characterize, but statistically likely to represent tiny cysts. Larger low-attenuation lesions are compatible with simple  cysts, measuring up to 1.4 cm in diameter in the periphery of segment 2 (no imaging follow-up for any of these lesions is recommended). No aggressive appearing hepatic lesions. No intra or extrahepatic biliary ductal dilatation. Gallbladder is moderately distended, but otherwise unremarkable in appearance. Pancreas: No pancreatic mass. No pancreatic ductal dilatation. No pancreatic or peripancreatic fluid collections or inflammatory changes. Spleen: Unremarkable. Adrenals/Urinary Tract: Multiple low-attenuation lesions in both kidneys, many of which are too small to definitively characterize, but statistically likely represent small simple cysts (no imaging follow-up recommended). Larger lesions are compatible with simple cysts, measuring up to 1.6 cm in diameter in the posterior aspect of the interpolar region of the right kidney. In addition, there is an exophytic high attenuation (56 HU) lesion extending off the anterior aspect of the lower pole of the left kidney measuring 2 cm in diameter (axial image 142 of series 7), minimally increased compared to prior CT 03/23/2021, considered indeterminate. 5 mm calculus at the left ureteropelvic junction (coronal image 88 of series 9). This is not associated with significant proximal hydronephrosis at this time to suggest urinary tract obstruction. No hydroureter. Gas present non dependently in the lumen of the urinary bladder. Urinary bladder wall appears thickened and mildly trabeculated with mild diffuse enhancement of the urothelium. Extending cephalad from the superior aspect of the urinary bladder on axial images 181-195 of series 7 and sagittal images 127-136 of series 10 there is some gas which extends in close proximity to the adjacent sigmoid colon such that direct communication between the colon and the urinary bladder lumen is not entirely excluded. Stomach/Bowel: The appearance of the stomach is normal. No pathologic dilatation of small bowel or colon.  Numerous colonic diverticuli are noted. No definite surrounding inflammatory changes are noted at this time to suggest an acute diverticulitis. However, previously noted diverticular abscess cavity has contracted, but is now intimately associated with the superior aspect of the urinary bladder and the adjacent sigmoid colon such that colovesical fistula  is not excluded (see discussion above). Normal appendix. Vascular/Lymphatic: Aortic atherosclerosis, with vascular findings and measurements pertinent to potential TAVR procedure, as detailed below. No lymphadenopathy noted in the abdomen or pelvis. Reproductive: Prostate gland and seminal vesicles are unremarkable in appearance. Other: No significant volume of ascites.  No pneumoperitoneum. Musculoskeletal: There are no aggressive appearing lytic or blastic lesions noted in the visualized portions of the skeleton. VASCULAR MEASUREMENTS PERTINENT TO TAVR: AORTA: Minimal Aortic Diameter-14 x 16 mm Severity of Aortic Calcification-moderate to severe RIGHT PELVIS: Right Common Iliac Artery - Minimal Diameter-9.0 x 7.8 mm Tortuosity-mild Calcification-moderate to severe Right External Iliac Artery - Minimal Diameter-8.5 x 6.4 mm Tortuosity - mild Calcification-minimal Right Common Femoral Artery - Minimal Diameter-6.6 x 8.3 mm Tortuosity - mild Calcification-mild LEFT PELVIS: Left Common Iliac Artery - Minimal Diameter-10.5 x 9.9 mm Tortuosity - mild Calcification-moderate to severe Left External Iliac Artery - Minimal Diameter-7.2 x 6.7 mm Tortuosity - mild Calcification-minimal Left Common Femoral Artery - Minimal Diameter-8.9 x 9.2 mm Tortuosity - mild Calcification-minimal Review of the MIP images confirms the above findings. IMPRESSION: 1. Vascular findings and measurements pertinent to potential TAVR procedure, as detailed above. 2. Severe thickening and calcification of the aortic valve, compatible with reported clinical history of severe aortic stenosis. 3. Gas  present within the lumen of the urinary bladder, with mild diffuse urothelial enhancement and potential colovesical fistula, as detailed above. Previously noted diverticular abscess cavity appears partially contracted, however, the likelihood of residual fistulous tract is high given the above findings. Urologic consultation is recommended. 4. 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 5. Multiple renal lesions bilaterally, including an indeterminate high attenuation lesion extending exophytically from the anterior aspect of the lower pole of the left kidney. Follow-up nonemergent outpatient abdominal MRI with and without IV gadolinium should be considered in the near future to provide definitive characterization and exclude neoplasm. 6. 5 mm calculus at the left ureteropelvic junction. No proximal hydronephrosis to indicate urinary tract obstruction at this time. 7. Aortic atherosclerosis, in addition to three-vessel coronary artery disease. 8. 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 9. Hepatic steatosis. 10. Additional incidental findings, as above. Electronically Signed   By: Trudie Reed M.D.   On: 03/09/2022 09:26    CT ANGIO CHEST AORTA W/CM & OR WO/CM   Result Date: 03/09/2022 CLINICAL DATA:  82 year old male with history of severe aortic stenosis. Preprocedural study prior to potential transcatheter aortic valve replacement (TAVR) procedure. EXAM: CT ANGIOGRAPHY CHEST, ABDOMEN AND PELVIS TECHNIQUE: Multidetector CT imaging through the chest, abdomen and pelvis was performed using the standard protocol during bolus administration of intravenous contrast. Multiplanar reconstructed images and MIPs were obtained and reviewed to evaluate the vascular anatomy. RADIATION DOSE REDUCTION: This  exam was performed according to the departmental dose-optimization program which includes automated exposure control, adjustment of the mA and/or kV according to patient size and/or use of iterative reconstruction technique. CONTRAST:  OMNIPAQUE IOHEXOL 350 MG/ML SOLN COMPARISON:  CT of the abdomen and pelvis 03/26/2021. No prior chest CT. FINDINGS: CTA CHEST FINDINGS Cardiovascular: Heart size is mildly enlarged. There is no significant pericardial fluid, thickening or pericardial calcification. There is aortic atherosclerosis, as well as atherosclerosis of the great vessels of the mediastinum and the coronary arteries, including calcified atherosclerotic plaque in the left anterior descending, left circumflex and right coronary  arteries. Severe thickening and calcification of the aortic valve. Dilatation of the pulmonic trunk (3.7 cm in diameter). Mediastinum/Lymph Nodes: No pathologically enlarged mediastinal or hilar lymph nodes. Esophagus is unremarkable in appearance. No axillary lymphadenopathy. Lungs/Pleura: No suspicious appearing pulmonary nodules or masses are noted. No acute consolidative airspace disease. No pleural effusions. Mild linear scarring in the left lung base. Musculoskeletal/Soft Tissues: There are no aggressive appearing lytic or blastic lesions noted in the visualized portions of the skeleton. CTA ABDOMEN AND PELVIS FINDINGS Hepatobiliary: Diffuse low attenuation throughout the hepatic parenchyma, indicative of a background of hepatic steatosis. Multiple subcentimeter low-attenuation lesions in the liver, too small to definitively characterize, but statistically likely to represent tiny cysts. Larger low-attenuation lesions are compatible with simple cysts, measuring up to 1.4 cm in diameter in the periphery of segment 2 (no imaging follow-up for any of these lesions is recommended). No aggressive appearing hepatic lesions. No intra or extrahepatic biliary ductal dilatation.  Gallbladder is moderately distended, but otherwise unremarkable in appearance. Pancreas: No pancreatic mass. No pancreatic ductal dilatation. No pancreatic or peripancreatic fluid collections or inflammatory changes. Spleen: Unremarkable. Adrenals/Urinary Tract: Multiple low-attenuation lesions in both kidneys, many of which are too small to definitively characterize, but statistically likely represent small simple cysts (no imaging follow-up recommended). Larger lesions are compatible with simple cysts, measuring up to 1.6 cm in diameter in the posterior aspect of the interpolar region of the right kidney. In addition, there is an exophytic high attenuation (56 HU) lesion extending off the anterior aspect of the lower pole of the left kidney measuring 2 cm in diameter (axial image 142 of series 7), minimally increased compared to prior CT 03/23/2021, considered indeterminate. 5 mm calculus at the left ureteropelvic junction (coronal image 88 of series 9). This is not associated with significant proximal hydronephrosis at this time to suggest urinary tract obstruction. No hydroureter. Gas present non dependently in the lumen of the urinary bladder. Urinary bladder wall appears thickened and mildly trabeculated with mild diffuse enhancement of the urothelium. Extending cephalad from the superior aspect of the urinary bladder on axial images 181-195 of series 7 and sagittal images 127-136 of series 10 there is some gas which extends in close proximity to the adjacent sigmoid colon such that direct communication between the colon and the urinary bladder lumen is not entirely excluded. Stomach/Bowel: The appearance of the stomach is normal. No pathologic dilatation of small bowel or colon. Numerous colonic diverticuli are noted. No definite surrounding inflammatory changes are noted at this time to suggest an acute diverticulitis. However, previously noted diverticular abscess cavity has contracted, but is now  intimately associated with the superior aspect of the urinary bladder and the adjacent sigmoid colon such that colovesical fistula is not excluded (see discussion above). Normal appendix. Vascular/Lymphatic: Aortic atherosclerosis, with vascular findings and measurements pertinent to potential TAVR procedure, as detailed below. No lymphadenopathy noted in the abdomen or pelvis. Reproductive: Prostate gland and seminal vesicles are unremarkable in appearance. Other: No significant volume of ascites.  No pneumoperitoneum. Musculoskeletal: There are no aggressive appearing lytic or blastic lesions noted in the visualized portions of the skeleton. VASCULAR MEASUREMENTS PERTINENT TO TAVR: AORTA: Minimal Aortic Diameter-14 x 16 mm Severity of Aortic Calcification-moderate to severe RIGHT PELVIS: Right Common Iliac Artery - Minimal Diameter-9.0 x 7.8 mm Tortuosity-mild Calcification-moderate to severe Right External Iliac Artery - Minimal Diameter-8.5 x 6.4 mm Tortuosity - mild Calcification-minimal Right Common Femoral Artery - Minimal Diameter-6.6 x 8.3 mm Tortuosity - mild Calcification-mild  LEFT PELVIS: Left Common Iliac Artery - Minimal Diameter-10.5 x 9.9 mm Tortuosity - mild Calcification-moderate to severe Left External Iliac Artery - Minimal Diameter-7.2 x 6.7 mm Tortuosity - mild Calcification-minimal Left Common Femoral Artery - Minimal Diameter-8.9 x 9.2 mm Tortuosity - mild Calcification-minimal Review of the MIP images confirms the above findings. IMPRESSION: 1. Vascular findings and measurements pertinent to potential TAVR procedure, as detailed above. 2. Severe thickening and calcification of the aortic valve, compatible with reported clinical history of severe aortic stenosis. 3. Gas present within the lumen of the urinary bladder, with mild diffuse urothelial enhancement and potential colovesical fistula, as detailed above. Previously noted diverticular abscess cavity appears partially contracted,  however, the likelihood of residual fistulous tract is high given the above findings. Urologic consultation is recommended. 4. 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 5. Multiple renal lesions bilaterally, including an indeterminate high attenuation lesion extending exophytically from the anterior aspect of the lower pole of the left kidney. Follow-up nonemergent outpatient abdominal MRI with and without IV gadolinium should be considered in the near future to provide definitive characterization and exclude neoplasm. 6. 5 mm calculus at the left ureteropelvic junction. No proximal hydronephrosis to indicate urinary tract obstruction at this time. 7. Aortic atherosclerosis, in addition to three-vessel coronary artery disease. 8. 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 9. Hepatic steatosis. 10. Additional incidental findings, as above. Electronically Signed   By: Trudie Reed M.D.   On: 03/09/2022 09:26    CARDIAC CATHETERIZATION   Result Date: 02/20/2022 1.  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 2.  Capacious iliofemoral vessels of the right side. 3.  Normal cardiac output of 7 L/min and cardiac index of 3.6 L/min/m with mean RA pressure of 3 mmHg and mean wedge pressure of 10 mmHg. Recommendation: Continue evaluation for aortic valve intervention.           I have independently reviewed the above radiology studies  and reviewed the findings with the patient.    Recent Lab Findings: Recent Labs       Lab Results  Component Value Date    WBC 7.8 02/13/2022    HGB 12.9 (L) 02/20/2022    HCT 38.0 (L) 02/20/2022    PLT 165 02/13/2022    GLUCOSE 89 02/13/2022    CHOL 121 01/16/2021    TRIG 172 (H) 01/16/2021    HDL 44 01/16/2021    LDLCALC 48  01/16/2021    ALT 14 01/16/2021    AST 20 01/16/2021    NA 146 (H) 02/20/2022    K 4.4 02/20/2022    CL 105 02/13/2022    CREATININE 1.37 (H) 02/13/2022    BUN 34 (H) 02/13/2022    CO2 22 02/13/2022          Assessment / Plan:   Matthew Mcneil 82 y.o. male referred in consultation for transcatheter aortic valve implantation.   + Severe, symptomatic aortic stenosis.   Meets criteria for AV replacement.   STS risk is 3.9% He does have moderate bilateral carotid stenosis and it is at increased risk for periprocedural stroke as well.    Risks/benefits/alternatives of TAVR were discussed at length (90% standard recovery, 6-9% morbidity [any organ, typically access site vascular complication needing surgery, stroke, or pacemaker] and <1% mortality. Options are TAVR, SAVR or medical  treatment. Patient understands SAVR is typically higher risk and takes longer recovery than TAVR in elderly patients. As well, discussed that medical Tx yields around 50% mortality for severe, symptomatic aortic stenosis in 2 years if left untreated, and this is an option.       Valve: 26 Sapien (Area [id number]) Bailout:  No (age) Access:  Transfemoral (min access diameter bilateral is 8.51mm) NYHA:II    Misc/procedural: None - scheduled for Tuesday   [place] 03/20/2022 3:10 PM

## 2022-03-26 NOTE — Op Note (Signed)
[facility]  TAVR OPERATIVE NOTE   Date of Procedure:  03/26/2022  Preoperative Diagnosis: Severe Aortic Stenosis   Postoperative Diagnosis: Same   Procedure:   Transcatheter Aortic Valve Replacement - Transfemoral Approach  Edwards Sapien 3 Resilia THV (size 26 mm, model # 9755RLS, serial # [id number] )   Co-Surgeons:   Justice Rocher, MD and Lenna Sciara, MD Anesthesiologist:  Suzette Battiest, MD  Echocardiographer:  Jenkins Rouge, MD  Pre-operative Echo Findings: Severe aortic stenosis Moderate left ventricular systolic dysfunction  Post-operative Echo Findings: No paravalvular leak Moderate left ventricular systolic dysfunction  Left Heart Catheterization Findings: Left ventricular end-diastolic pressure of 40NUUV   BRIEF CLINICAL NOTE AND INDICATIONS FOR SURGERY  The patient is an 82 year old male with a history of moderate to severe aortic stenosis, progressive LV dysfunction, chronic kidney disease stage III, BMI of 30, hypertension, and hyperlipidemia who was referred for elective transcatheter aortic valve replacement with a 26 mm SAPIEN 3 ultra valve.  During the course of the patient's preoperative work up they have been evaluated comprehensively by a multidisciplinary team of specialists coordinated through the [facility] in the [facility].  They have been demonstrated to suffer from symptomatic severe aortic stenosis as noted above. The patient has been counseled extensively as to the relative risks and benefits of all options for the treatment of severe aortic stenosis including long term medical therapy, conventional surgery for aortic valve replacement, and transcatheter aortic valve replacement.  The patient has been independently evaluated by Dr. Tenny Craw with CT surgery and they are felt to be at high risk for conventional surgical aortic valve replacement. The surgeon indicated the patient would be a  poor candidate for conventional surgery. Based upon review of all of the patient's preoperative diagnostic tests they are felt to be candidate for transcatheter aortic valve replacement using the transfemoral approach as an alternative to high risk conventional surgery.    Following the decision to proceed with transcatheter aortic valve replacement, a discussion has been held regarding what types of management strategies would be attempted intraoperatively in the event of life-threatening complications, including whether or not the patient would be considered a candidate for the use of cardiopulmonary bypass and/or conversion to open sternotomy for attempted surgical intervention.  The patient has been advised of a variety of complications that might develop peculiar to this approach including but not limited to risks of death, stroke, paravalvular leak, aortic dissection or other major vascular complications, aortic annulus rupture, device embolization, cardiac rupture or perforation, acute myocardial infarction, arrhythmia, heart block or bradycardia requiring permanent pacemaker placement, congestive heart failure, respiratory failure, renal failure, pneumonia, infection, other late complications related to structural valve deterioration or migration, or other complications that might ultimately cause a temporary or permanent loss of functional independence or other long term morbidity.  The patient provides full informed consent for the procedure as described and all questions were answered preoperatively.    DETAILS OF THE OPERATIVE PROCEDURE  PREPARATION:   The patient is brought to the operating room on the above mentioned date and central monitoring was established by the anesthesia team including placement of a radial arterial line. The patient is placed in the supine position on the operating table.  Intravenous antibiotics are administered. Conscious sedation is used.   Baseline transthoracic  echocardiogram was performed. The patient's chest, abdomen, both groins, and both lower extremities are prepared and draped in a sterile manner. A time out procedure  is performed.   PERIPHERAL ACCESS:   Using the modified Seldinger technique, femoral arterial was obtained with placement of a 6 Fr sheath in the artery left side using u/s guidance.  A pigtail diagnostic catheter was passed through the femoral arterial sheath under fluoroscopic guidance into the aortic root.   Aortic root angiography was performed in order to determine the optimal angiographic angle for valve deployment.  TRANSFEMORAL ACCESS:  A micropuncture kit was used to gain access to the right femoral artery using u/s guidance. Position confirmed with angiography. Pre-closure with double ProGlide closure devices. The patient was heparinized systemically and ACT verified > 250 seconds.    A 14 Fr transfemoral E-sheath was introduced into the right femoral artery after progressively dilating over an Amplatz superstiff wire. A pigtail catheter was used to direct a straight-tip exchange length wire across the native aortic valve into the left ventricle. This was exchanged out for a pigtail catheter and position was confirmed in the LV apex. Simultaneous left ventricular, aortic, and left ventricular end-diastolic pressures were recorded.  The pigtail catheter was then exchanged for an Safari wire in the LV apex.  Direct LV pacing thresholds were assessed and found to be adequate.   TRANSCATHETER HEART VALVE DEPLOYMENT:  An Edwards Sapien 3 THV (size 26 mm) was prepared and crimped per manufacturer's guidelines, and the proper orientation of the valve is confirmed on the [employer] delivery system. The valve was advanced through the introducer sheath using normal technique until in an appropriate position in the abdominal aorta beyond the sheath tip. The balloon was then retracted and using the fine-tuning wheel was centered on the  valve. The valve was then advanced across the aortic arch using appropriate flexion of the catheter. The valve was carefully positioned across the aortic valve annulus. The Commander catheter was retracted using normal technique. Once final position of the valve has been confirmed by angiographic assessment, the valve is deployed while temporarily holding ventilation and during rapid ventricular pacing to maintain systolic blood pressure < 50 mmHg and pulse pressure < 10 mmHg. The balloon inflation is held for >3 seconds after reaching full deployment volume. Once the balloon has fully deflated the balloon is retracted into the ascending aorta and valve function is assessed using TTE. There is felt to be  paravalvular leak and no central aortic insufficiency.  The patient's hemodynamic recovery following valve deployment is good.  The deployment balloon and guidewire are both removed. Echo demostrated acceptable post-procedural gradients, stable mitral valve function, and no AI.   PROCEDURE COMPLETION:  The sheath was then removed and closure devices were completed. Protamine was administered once femoral arterial repair was complete. The temporary pacemaker, pigtail catheters and femoral sheaths were removed with a Mynx closure device placed in the artery and manual pressure used for venous hemostasis.    The patient tolerated the procedure well and is transported to the surgical intensive care in stable condition. There were no immediate intraoperative complications. All sponge instrument and needle counts are verified correct at completion of the operation.   No blood products were administered during the operation.  The patient received a total of 100 mL of intravenous contrast during the procedure.  Early Osmond MD 03/26/2022 1:27 PM

## 2022-03-26 NOTE — Transfer of Care (Signed)
Immediate Anesthesia Transfer of Care Note  Patient: Matthew Mcneil  Procedure(s) Performed: Transcatheter Aortic Valve Replacement, Transfemoral using Edwards 26 MM SAPIEN 3 Ultra (Right: Chest) INTRAOPERATIVE TRANSTHORACIC ECHOCARDIOGRAM  Patient Location: Cath Lab  Anesthesia Type:MAC  Level of Consciousness: awake, alert , and oriented  Airway & Oxygen Therapy: Patient Spontanous Breathing and Patient connected to nasal cannula oxygen  Post-op Assessment: Report given to RN and Post -op Vital signs reviewed and stable  Post vital signs: Reviewed and stable  Last Vitals:  Vitals Value Taken Time  BP 82/40 03/26/22 1311  Temp    Pulse 49 03/26/22 1311  Resp 15 03/26/22 1311  SpO2 99 % 03/26/22 1311  Vitals shown include unvalidated device data.  Last Pain:  Vitals:   03/26/22 0835  TempSrc:   PainSc: 0-No pain         Complications: No notable events documented.

## 2022-03-26 NOTE — Discharge Summary (Addendum)
[name]  Discharge Summary    Patient ID: Matthew Mcneil MRN: [id number]; DOB: 12-20-1940  Admit date: 03/26/2022 Discharge date: 03/27/2022  Primary Care Provider: Raina Mcneil., MD  Primary Cardiologist: Matthew More, MD / Dr. Ali Mcneil & Dr. Tenny Mcneil (TAVR)  Discharge Diagnoses    Principal Problem:   S/P TAVR (transcatheter aortic valve replacement) Active Problems:   Benign prostate hyperplasia   Chronic kidney disease, stage III (moderate) (HCC)   Obesity   Coronary artery disease involving native coronary artery of native heart with angina pectoris ([place])   Essential hypertension   Hyperparathyroidism ([place])   Hyperlipidemia   Carotid artery occlusion   Anemia, chronic disease   Nonrheumatic aortic valve stenosis   Allergies Allergies  Allergen Reactions   Aldactone [Spironolactone] Other (See Comments)    Patient was in a panic   Statins Other (See Comments)    Myalgia     Diagnostic Studies/Procedures    [facility]  TAVR OPERATIVE NOTE     Date of Procedure:                03/26/2022   Preoperative Diagnosis:      Severe Aortic Stenosis    Postoperative Diagnosis:    Same    Procedure:        Transcatheter Aortic Valve Replacement - Transfemoral Approach             Edwards Sapien 3 Resilia THV (size 26 mm, model # 9755RLS, serial # [id number] )              Co-Surgeons:                         Matthew Rocher, MD and Matthew Sciara, MD Anesthesiologist:                  Matthew Battiest, MD   Echocardiographer:              Matthew Rouge, MD   Pre-operative Echo Findings: Severe aortic stenosis Moderate left ventricular systolic dysfunction   Post-operative Echo Findings: No paravalvular leak Moderate left ventricular systolic dysfunction   Left Heart Catheterization Findings: Left ventricular end-diastolic pressure of 95MWUX _____________    Echo 03/27/22: completed but  pending formal read at the time of discharge   History of Present Illness     Matthew Mcneil is a 82 y.o. male with a history of obesity (BMI 30), CKD stage IIIb,  HTN, HLD, macular degeneration with vision impairment, remote fistula repair, perforated colon (03/2021) and severe AS with worsening of LV function (EF 40-45%) who presented to [facility] on 03/26/22 for planned TAVR.   He has been followed by Dr. Bettina Mcneil for some time. The patient noticed progressive DOE. Serial echocardiograms have demonstrated progression of aortic stenosis to severe with the diminution in ejection fraction. Echo 01/08/22 showed EF 45-50%, and severe AS with mean grad 33.5 mmHg, peak grad 63.8 mmHg, AVA 0.68 cm2, DVI 0.24, mild AI, mild MR, small pericardial effusion. [employer] 02/20/22 showed minimal obstructive coronary artery disease of left dominant system. Pre TAVR CTs showed severe AS with anatomy suitable for TAVR as well as gas in the urinary bladder, possible colovesical fistula, renal lesions as well as a left ureteropelvic junction stone.    The patient has been evaluated by the multidisciplinary valve team and felt to have severe, symptomatic aortic stenosis and to be a suitable  candidate for TAVR, which was set up for 03/26/22. PAT labs showed an abnormal UA. Given CT findings above, we discussed with urology who recommended antibiotic suppression with Cipro until he could be seen by urology.   Hospital Course     Consultants: none   Severe AS: s/p successful TAVR with a 26 mm Edwards Sapien 3 Ultra Resilia THV via the TF approach on 03/26/22. Post operative echo completed but pending forma read. Groin sites are stable. ECG with sinus and no high grade heart block. Resumed on home Asprin 81mg  daily. He has walked with cardiac rehab with no issues. Plan for discharge home today with close follow up in the office next week.   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 Treated with one dose of IV lasix. Will  resume lasix and home dosing.    CKD stage IIIb: creat 1.78 this am which is slightly above previous baseline. Will hold Benicar until I see him back next week and repeat labs.   Possible colovesical fistula: pre op UA was abnormal with many leukocytes and bacteria. Pre TAVR CT done on 12/29 showed gas in urinary bladder with possible colovesical fistula. Discussed case with urology who recommended antibiotic suppression with Cipro until he could be seen by urology. Has an apt on 1/29.  Renal lesions: pre TAVR CT showed "multiple renal lesions bilaterally, including an indeterminate high attenuation lesion extending exophytically from the anterior aspect of the lower pole of the left kidney. Follow-up nonemergent outpatient abdominal MRI with and without IV gadolinium should be considered in the near future to provide definitive characterization and exclude neoplasm." This will be discussed in the outpatient setting.  _____________  Discharge Vitals Blood pressure 135/61, pulse 74, temperature 97.7 F (36.5 C), temperature source Oral, resp. rate 17, height 5\' 6"  (1.676 m), weight 84.7 kg, SpO2 97 %.  Filed Weights   03/26/22 0805 03/27/22 0510  Weight: 86.2 kg 84.7 kg    GEN: Well nourished, well developed, in no acute distress HEENT: normal Neck: no JVD or masses Cardiac: RRR; no murmurs, rubs, or gallops,no edema  Respiratory:  clear to auscultation bilaterally, normal work of breathing GI: soft, nontender, nondistended, + BS MS: no deformity or atrophy Skin: warm and dry, no rash.  Groin sites clear without hematoma. mild ecchymosis noted Neuro:  Alert and Oriented x 3, Strength and sensation are intact Psych: euthymic mood, full affect  Labs & Radiologic Studies    CBC Recent Labs    03/26/22 1345 03/27/22 0146  WBC  --  10.1  HGB 10.5* 8.9*  HCT 31.0* 28.7*  MCV  --  94.1  PLT  --  [id number]   Basic Metabolic Panel Recent Labs    03/27/22 0146 03/27/22 0929  NA 136 142   K 4.3 4.2  CL 100 105  CO2 27 26  GLUCOSE 138* 108*  BUN 23 32*  CREATININE 0.47* 1.78*  CALCIUM 9.1 10.3  MG 1.8  --    Liver Function Tests No results for input(s): "AST", "ALT", "ALKPHOS", "BILITOT", "PROT", "ALBUMIN" in the last 72 hours. No results for input(s): "LIPASE", "AMYLASE" in the last 72 hours. Cardiac Enzymes No results for input(s): "CKTOTAL", "CKMB", "CKMBINDEX", "TROPONINI" in the last 72 hours. BNP Invalid input(s): "POCBNP" D-Dimer No results for input(s): "DDIMER" in the last 72 hours. Hemoglobin A1C No results for input(s): "HGBA1C" in the last 72 hours. Fasting Lipid Panel No results for input(s): "CHOL", "HDL", "LDLCALC", "TRIG", "CHOLHDL", "LDLDIRECT"  in the last 72 hours. Thyroid Function Tests No results for input(s): "TSH", "T4TOTAL", "T3FREE", "THYROIDAB" in the last 72 hours.  Invalid input(s): "FREET3" _____________  Structural Heart Procedure  Result Date: 03/26/2022 See surgical note for result.  ECHOCARDIOGRAM LIMITED  Result Date: 03/26/2022    ECHOCARDIOGRAM LIMITED REPORT   Patient Name:   Ashly Maltese Date of Exam: 03/26/2022 Medical Rec #:  [id number]            Height:       66.0 in Accession #:    [id number]           Weight:       190.0 lb Date of Birth:  15-Nov-1940            BSA:          1.957 m Patient Age:    49 years             BP:           174/75 mmHg Patient Gender: M                    HR:           86 bpm. Exam Location:  Inpatient Procedure: Limited Echo, Color Doppler and Cardiac Doppler Indications:    Aortic Stenosis i35.0  History:        Patient has prior history of Echocardiogram examinations, most                 recent 01/08/2022. CAD; Risk Factors:Hypertension and                 Dyslipidemia.  Sonographer:    Raquel Sarna Senior RDCS Referring Phys: Early Osmond  Sonographer Comments: Philis Kendall TAVR Implanted IMPRESSIONS  1. Left ventricular ejection fraction, by estimation, is 50%. The left ventricular internal  cavity size was mildly dilated. There is moderate left ventricular hypertrophy.  2. Right ventricular systolic function is normal. The right ventricular size is normal.  3. Small LV posterior effusion present on TTE 01/08/22 and unchanged post procedure. a small pericardial effusion is present. The pericardial effusion is posterior to the left ventricle.  4. The mitral valve is abnormal. Trivial mitral valve regurgitation.  5. Pre TAVR: Tri leaflet AV calcified with severe AS mean gradient 33 peak 62 mmHg supine in OR. AVA 0.78 cm2         Post TAVR: well positioned 26 mm Sapien 3 valve No PVL mean gradient 4 peak 9 mmHg AVA 3.3 cm2. The aortic valve has been repaired/replaced. FINDINGS  Left Ventricle: Left ventricular ejection fraction, by estimation, is 50%. The left ventricular internal cavity size was mildly dilated. There is moderate left ventricular hypertrophy. Right Ventricle: The right ventricular size is normal. No increase in right ventricular wall thickness. Right ventricular systolic function is normal. Pericardium: Small LV posterior effusion present on TTE 01/08/22 and unchanged post procedure. A small pericardial effusion is present. The pericardial effusion is posterior to the left ventricle. Mitral Valve: The mitral valve is abnormal. There is mild thickening of the mitral valve leaflet(s). Trivial mitral valve regurgitation. Aortic Valve: Pre TAVR: Tri leaflet AV calcified with severe AS mean gradient 33 peak 62 mmHg supine in OR. AVA 0.78 cm2 Post TAVR: well positioned 26 mm Sapien 3 valve No PVL mean gradient 4 peak 9 mmHg AVA 3.3 cm2. The aortic valve has been repaired/replaced. Aortic valve mean gradient measures 4.0 mmHg. Aortic valve peak gradient measures 8.5  mmHg. Aortic valve area, by  VTI measures 3.28 cm. Additional Comments: Spectral Doppler performed. Color Doppler performed.  LEFT VENTRICLE PLAX 2D LVOT diam:     2.20 cm LV SV:         83 LV SV Index:   43 LVOT Area:     3.80 cm   AORTIC VALVE AV Area (Vmax):    2.86 cm AV Area (Vmean):   3.31 cm AV Area (VTI):     3.28 cm AV Vmax:           146.00 cm/s AV Vmean:          86.900 cm/s AV VTI:            0.254 m AV Peak Grad:      8.5 mmHg AV Mean Grad:      4.0 mmHg LVOT Vmax:         110.00 cm/s LVOT Vmean:        75.600 cm/s LVOT VTI:          0.219 m LVOT/AV VTI ratio: 0.86  SHUNTS Systemic VTI:  0.22 m Systemic Diam: 2.20 cm Matthew Rouge MD Electronically signed by Matthew Rouge MD Signature Date/Time: 03/26/2022/12:50:35 PM    Final    DG Chest 2 View  Result Date: 03/24/2022 CLINICAL DATA:  [id number] Pre-op exam [id number] EXAM: CHEST - 2 VIEW COMPARISON:  04/03/2019 FINDINGS: Cardiac silhouette enlarged. No evidence of pneumothorax or pleural effusion. No evidence of pulmonary edema or pneumonia. Thoracic degenerative changes noted. IMPRESSION: Enlarged cardiac silhouette.  Lungs are clear. Electronically Signed   By: Sammie Bench M.D.   On: 03/24/2022 10:32   CT CORONARY MORPH W/CTA COR W/SCORE W/CA W/CM &/OR WO/CM  Addendum Date: 03/14/2022   ADDENDUM REPORT: 03/14/2022 06:45 ADDENDUM: Extracardiac findings were described separately under dictation for contemporaneously obtained CTA chest, abdomen and pelvis dated 03/08/2022, please see that report for full description of relevant extracardiac findings. Electronically Signed   By: Vinnie Langton M.D.   On: 03/14/2022 06:45   Result Date: 03/14/2022 CLINICAL DATA:  Severe Aortic Stenosis. EXAM: Cardiac TAVR CT TECHNIQUE: A non-contrast, gated CT scan was obtained with axial slices of 3 mm through the heart for aortic valve calcium scoring. A 120 kV retrospective, gated, contrast cardiac scan was obtained. Gantry rotation speed was 250 msecs and collimation was 0.6 mm. Nitroglycerin was not given. The 3D data set was reconstructed in 5% intervals of the 0-95% of the R-R cycle. Systolic and diastolic phases were analyzed on a dedicated workstation using MPR, MIP, and VRT modes.  The patient received 100 cc of contrast. FINDINGS: Image quality: Excellent. Noise artifact is: Limited. Valve Morphology: Tricuspid aortic valve with diffuse severe calcifications. Severely restricted leaflet movement in systole. Aortic Valve Calcium score: 1458 Aortic annular dimension: Phase assessed: 15% Annular area: 452 mm2 Annular perimeter: 76.5 mm Max diameter: 26.0 mm Min diameter: 23.0 mm Annular and subannular calcification: None. Membranous septum length: 8.9 mm Optimal coplanar projection: LAO 21 CAU 13 Coronary Artery Height above Annulus: Left Main: 11.0 mm Right Coronary: 18.7 mm Sinus of Valsalva Measurements: Non-coronary: 32 mm Right-coronary: 31 mm Left-coronary: 33 mm Sinus of Valsalva Height: Non-coronary: 21.2 mm Right-coronary: 22.9 mm Left-coronary: 21.7 mm Sinotubular Junction: 29 mm Ascending Thoracic Aorta: 36 mm Coronary Arteries: Normal coronary origin. Left dominance. The study was performed without use of NTG and is insufficient for plaque evaluation. Please refer to recent cardiac catheterization for coronary assessment. Cardiac Morphology: Right Atrium:  Right atrial size is within normal limits. Right Ventricle: The right ventricular cavity is within normal limits. Left Atrium: Left atrial size is normal in size with no left atrial appendage filling defect. Left Ventricle: The ventricular cavity size is within normal limits. Pulmonary arteries: Dilated pulmonary artery suggestive of pulmonary hypertension. Pulmonary veins: Normal pulmonary venous drainage. Pericardium: Normal thickness with no significant effusion or calcium present. Mitral Valve: The mitral valve is normal structure without significant calcification. Extra-cardiac findings: See attached radiology report for non-cardiac structures. IMPRESSION: 1. Annular measurements favor a 26 mm S3 or 29 mm Evolut Pro. 2. No significant annular or subannular calcifications. 3. Sufficient coronary to annulus distance. 4. Optimal  Fluoroscopic Angle for Delivery: LAO 21 CAU 13 5. 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 [place] T. Audie Box, MD Electronically Signed: By: Eleonore Chiquito M.D. On: 03/10/2022 16:22   CT ANGIO ABDOMEN PELVIS  W &/OR WO CONTRAST  Result Date: 03/09/2022 CLINICAL DATA:  82 year old male with history of severe aortic stenosis. Preprocedural study prior to potential transcatheter aortic valve replacement (TAVR) procedure. EXAM: CT ANGIOGRAPHY CHEST, ABDOMEN AND PELVIS TECHNIQUE: Multidetector CT imaging through the chest, abdomen and pelvis was performed using the standard protocol during bolus administration of intravenous contrast. Multiplanar reconstructed images and MIPs were obtained and reviewed to evaluate the vascular anatomy. RADIATION DOSE REDUCTION: This exam was performed according to the departmental dose-optimization program which includes automated exposure control, adjustment of the mA and/or kV according to patient size and/or use of iterative reconstruction technique. CONTRAST:  129mL OMNIPAQUE IOHEXOL 350 MG/ML SOLN COMPARISON:  CT of the abdomen and pelvis 03/26/2021. No prior chest CT. FINDINGS: CTA CHEST FINDINGS Cardiovascular: Heart size is mildly enlarged. There is no significant pericardial fluid, thickening or pericardial calcification. There is aortic atherosclerosis, as well as atherosclerosis of the great vessels of the mediastinum and the coronary arteries, including calcified atherosclerotic plaque in the left anterior descending, left circumflex and right coronary arteries. Severe thickening and calcification of the aortic valve. Dilatation of the pulmonic trunk (3.7 cm in diameter). Mediastinum/Lymph Nodes: No pathologically enlarged mediastinal or hilar lymph nodes. Esophagus is unremarkable in appearance. No axillary lymphadenopathy. Lungs/Pleura: No suspicious appearing pulmonary nodules or masses are noted. No acute consolidative airspace disease. No pleural  effusions. Mild linear scarring in the left lung base. Musculoskeletal/Soft Tissues: There are no aggressive appearing lytic or blastic lesions noted in the visualized portions of the skeleton. CTA ABDOMEN AND PELVIS FINDINGS Hepatobiliary: Diffuse low attenuation throughout the hepatic parenchyma, indicative of a background of hepatic steatosis. Multiple subcentimeter low-attenuation lesions in the liver, too small to definitively characterize, but statistically likely to represent tiny cysts. Larger low-attenuation lesions are compatible with simple cysts, measuring up to 1.4 cm in diameter in the periphery of segment 2 (no imaging follow-up for any of these lesions is recommended). No aggressive appearing hepatic lesions. No intra or extrahepatic biliary ductal dilatation. Gallbladder is moderately distended, but otherwise unremarkable in appearance. Pancreas: No pancreatic mass. No pancreatic ductal dilatation. No pancreatic or peripancreatic fluid collections or inflammatory changes. Spleen: Unremarkable. Adrenals/Urinary Tract: Multiple low-attenuation lesions in both kidneys, many of which are too small to definitively characterize, but statistically likely represent small simple cysts (no imaging follow-up recommended). Larger lesions are compatible with simple cysts, measuring up to 1.6 cm in diameter in the posterior aspect of the interpolar region of the right kidney. In addition, there is an exophytic high attenuation (56 HU) lesion extending off the anterior aspect of the  lower pole of the left kidney measuring 2 cm in diameter (axial image 142 of series 7), minimally increased compared to prior CT 03/23/2021, considered indeterminate. 5 mm calculus at the left ureteropelvic junction (coronal image 88 of series 9). This is not associated with significant proximal hydronephrosis at this time to suggest urinary tract obstruction. No hydroureter. Gas present non dependently in the lumen of the urinary  bladder. Urinary bladder wall appears thickened and mildly trabeculated with mild diffuse enhancement of the urothelium. Extending cephalad from the superior aspect of the urinary bladder on axial images 181-195 of series 7 and sagittal images 127-136 of series 10 there is some gas which extends in close proximity to the adjacent sigmoid colon such that direct communication between the colon and the urinary bladder lumen is not entirely excluded. Stomach/Bowel: The appearance of the stomach is normal. No pathologic dilatation of small bowel or colon. Numerous colonic diverticuli are noted. No definite surrounding inflammatory changes are noted at this time to suggest an acute diverticulitis. However, previously noted diverticular abscess cavity has contracted, but is now intimately associated with the superior aspect of the urinary bladder and the adjacent sigmoid colon such that colovesical fistula is not excluded (see discussion above). Normal appendix. Vascular/Lymphatic: Aortic atherosclerosis, with vascular findings and measurements pertinent to potential TAVR procedure, as detailed below. No lymphadenopathy noted in the abdomen or pelvis. Reproductive: Prostate gland and seminal vesicles are unremarkable in appearance. Other: No significant volume of ascites.  No pneumoperitoneum. Musculoskeletal: There are no aggressive appearing lytic or blastic lesions noted in the visualized portions of the skeleton. VASCULAR MEASUREMENTS PERTINENT TO TAVR: AORTA: Minimal Aortic Diameter-14 x 16 mm Severity of Aortic Calcification-moderate to severe RIGHT PELVIS: Right Common Iliac Artery - Minimal Diameter-9.0 x 7.8 mm Tortuosity-mild Calcification-moderate to severe Right External Iliac Artery - Minimal Diameter-8.5 x 6.4 mm Tortuosity - mild Calcification-minimal Right Common Femoral Artery - Minimal Diameter-6.6 x 8.3 mm Tortuosity - mild Calcification-mild LEFT PELVIS: Left Common Iliac Artery - Minimal Diameter-10.5  x 9.9 mm Tortuosity - mild Calcification-moderate to severe Left External Iliac Artery - Minimal Diameter-7.2 x 6.7 mm Tortuosity - mild Calcification-minimal Left Common Femoral Artery - Minimal Diameter-8.9 x 9.2 mm Tortuosity - mild Calcification-minimal Review of the MIP images confirms the above findings. IMPRESSION: 1. Vascular findings and measurements pertinent to potential TAVR procedure, as detailed above. 2. Severe thickening and calcification of the aortic valve, compatible with reported clinical history of severe aortic stenosis. 3. Gas present within the lumen of the urinary bladder, with mild diffuse urothelial enhancement and potential colovesical fistula, as detailed above. Previously noted diverticular abscess cavity appears partially contracted, however, the likelihood of residual fistulous tract is high given the above findings. Urologic consultation is recommended. 4. 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 5. Multiple renal lesions bilaterally, including an indeterminate high attenuation lesion extending exophytically from the anterior aspect of the lower pole of the left kidney. Follow-up nonemergent outpatient abdominal MRI with and without IV gadolinium should be considered in the near future to provide definitive characterization and exclude neoplasm. 6. 5 mm calculus at the left ureteropelvic junction. No proximal hydronephrosis to indicate urinary tract obstruction at this time. 7. Aortic atherosclerosis, in addition to three-vessel coronary artery disease. 8. 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 9. Hepatic steatosis. 10. Additional incidental findings, as  above. Electronically Signed   By: Vinnie Langton M.D.   On: 03/09/2022 09:26   CT ANGIO CHEST AORTA W/CM & OR  WO/CM  Result Date: 03/09/2022 CLINICAL DATA:  82 year old male with history of severe aortic stenosis. Preprocedural study prior to potential transcatheter aortic valve replacement (TAVR) procedure. EXAM: CT ANGIOGRAPHY CHEST, ABDOMEN AND PELVIS TECHNIQUE: Multidetector CT imaging through the chest, abdomen and pelvis was performed using the standard protocol during bolus administration of intravenous contrast. Multiplanar reconstructed images and MIPs were obtained and reviewed to evaluate the vascular anatomy. RADIATION DOSE REDUCTION: This exam was performed according to the departmental dose-optimization program which includes automated exposure control, adjustment of the mA and/or kV according to patient size and/or use of iterative reconstruction technique. CONTRAST:  120mL OMNIPAQUE IOHEXOL 350 MG/ML SOLN COMPARISON:  CT of the abdomen and pelvis 03/26/2021. No prior chest CT. FINDINGS: CTA CHEST FINDINGS Cardiovascular: Heart size is mildly enlarged. There is no significant pericardial fluid, thickening or pericardial calcification. There is aortic atherosclerosis, as well as atherosclerosis of the great vessels of the mediastinum and the coronary arteries, including calcified atherosclerotic plaque in the left anterior descending, left circumflex and right coronary arteries. Severe thickening and calcification of the aortic valve. Dilatation of the pulmonic trunk (3.7 cm in diameter). Mediastinum/Lymph Nodes: No pathologically enlarged mediastinal or hilar lymph nodes. Esophagus is unremarkable in appearance. No axillary lymphadenopathy. Lungs/Pleura: No suspicious appearing pulmonary nodules or masses are noted. No acute consolidative airspace disease. No pleural effusions. Mild linear scarring in the left lung base. Musculoskeletal/Soft Tissues: There are no aggressive appearing lytic or blastic lesions noted in the visualized portions of the skeleton. CTA ABDOMEN AND PELVIS FINDINGS Hepatobiliary:  Diffuse low attenuation throughout the hepatic parenchyma, indicative of a background of hepatic steatosis. Multiple subcentimeter low-attenuation lesions in the liver, too small to definitively characterize, but statistically likely to represent tiny cysts. Larger low-attenuation lesions are compatible with simple cysts, measuring up to 1.4 cm in diameter in the periphery of segment 2 (no imaging follow-up for any of these lesions is recommended). No aggressive appearing hepatic lesions. No intra or extrahepatic biliary ductal dilatation. Gallbladder is moderately distended, but otherwise unremarkable in appearance. Pancreas: No pancreatic mass. No pancreatic ductal dilatation. No pancreatic or peripancreatic fluid collections or inflammatory changes. Spleen: Unremarkable. Adrenals/Urinary Tract: Multiple low-attenuation lesions in both kidneys, many of which are too small to definitively characterize, but statistically likely represent small simple cysts (no imaging follow-up recommended). Larger lesions are compatible with simple cysts, measuring up to 1.6 cm in diameter in the posterior aspect of the interpolar region of the right kidney. In addition, there is an exophytic high attenuation (56 HU) lesion extending off the anterior aspect of the lower pole of the left kidney measuring 2 cm in diameter (axial image 142 of series 7), minimally increased compared to prior CT 03/23/2021, considered indeterminate. 5 mm calculus at the left ureteropelvic junction (coronal image 88 of series 9). This is not associated with significant proximal hydronephrosis at this time to suggest urinary tract obstruction. No hydroureter. Gas present non dependently in the lumen of the urinary bladder. Urinary bladder wall appears thickened and mildly trabeculated with mild diffuse enhancement of the urothelium. Extending cephalad from the superior aspect of the urinary bladder on axial images 181-195 of series 7 and sagittal images  127-136 of series 10 there is some gas which extends in close proximity to the adjacent sigmoid colon such that direct communication between the colon and the urinary  bladder lumen is not entirely excluded. Stomach/Bowel: The appearance of the stomach is normal. No pathologic dilatation of small bowel or colon. Numerous colonic diverticuli are noted. No definite surrounding inflammatory changes are noted at this time to suggest an acute diverticulitis. However, previously noted diverticular abscess cavity has contracted, but is now intimately associated with the superior aspect of the urinary bladder and the adjacent sigmoid colon such that colovesical fistula is not excluded (see discussion above). Normal appendix. Vascular/Lymphatic: Aortic atherosclerosis, with vascular findings and measurements pertinent to potential TAVR procedure, as detailed below. No lymphadenopathy noted in the abdomen or pelvis. Reproductive: Prostate gland and seminal vesicles are unremarkable in appearance. Other: No significant volume of ascites.  No pneumoperitoneum. Musculoskeletal: There are no aggressive appearing lytic or blastic lesions noted in the visualized portions of the skeleton. VASCULAR MEASUREMENTS PERTINENT TO TAVR: AORTA: Minimal Aortic Diameter-14 x 16 mm Severity of Aortic Calcification-moderate to severe RIGHT PELVIS: Right Common Iliac Artery - Minimal Diameter-9.0 x 7.8 mm Tortuosity-mild Calcification-moderate to severe Right External Iliac Artery - Minimal Diameter-8.5 x 6.4 mm Tortuosity - mild Calcification-minimal Right Common Femoral Artery - Minimal Diameter-6.6 x 8.3 mm Tortuosity - mild Calcification-mild LEFT PELVIS: Left Common Iliac Artery - Minimal Diameter-10.5 x 9.9 mm Tortuosity - mild Calcification-moderate to severe Left External Iliac Artery - Minimal Diameter-7.2 x 6.7 mm Tortuosity - mild Calcification-minimal Left Common Femoral Artery - Minimal Diameter-8.9 x 9.2 mm Tortuosity - mild  Calcification-minimal Review of the MIP images confirms the above findings. IMPRESSION: 1. Vascular findings and measurements pertinent to potential TAVR procedure, as detailed above. 2. Severe thickening and calcification of the aortic valve, compatible with reported clinical history of severe aortic stenosis. 3. Gas present within the lumen of the urinary bladder, with mild diffuse urothelial enhancement and potential colovesical fistula, as detailed above. Previously noted diverticular abscess cavity appears partially contracted, however, the likelihood of residual fistulous tract is high given the above findings. Urologic consultation is recommended. 4. 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 5. Multiple renal lesions bilaterally, including an indeterminate high attenuation lesion extending exophytically from the anterior aspect of the lower pole of the left kidney. Follow-up nonemergent outpatient abdominal MRI with and without IV gadolinium should be considered in the near future to provide definitive characterization and exclude neoplasm. 6. 5 mm calculus at the left ureteropelvic junction. No proximal hydronephrosis to indicate urinary tract obstruction at this time. 7. Aortic atherosclerosis, in addition to three-vessel coronary artery disease. 8. 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 9. Hepatic steatosis. 10. Additional incidental findings, as above. Electronically Signed   By: Trudie Reed M.D.   On: 03/09/2022 09:26   Disposition   Pt is being discharged home today in good condition.  Follow-up Plans & Appointments     Follow-up Information     Janetta Hora, PA-C. Go on 04/05/2022.   Specialties: Cardiology, Radiology Why: @ 10am, please arrive at least 10 min  early Contact information: [address] [place] [address] [phone]                Discharge Instructions     Amb Referral to Cardiac Rehabilitation   Complete by: As directed    To [place]   Diagnosis: Valve Replacement   Valve: Aortic Comment - TAVR   After initial evaluation  and assessments completed: Virtual Based Care may be provided alone or in conjunction with Phase 2 Cardiac Rehab based on patient barriers.: Yes   Intensive Cardiac Rehabilitation (ICR) [facility] location only OR Traditional Cardiac Rehabilitation (TCR) *If criteria for ICR are not met will enroll in TCR [facility] only): Yes       Discharge Medications   Allergies as of 03/27/2022       Reactions   Aldactone [spironolactone] Other (See Comments)   Patient was in a panic   Statins Other (See Comments)   Myalgia        Medication List     TAKE these medications    acetaminophen 500 MG tablet Commonly known as: TYLENOL Take 500-1,000 mg by mouth every 6 (six) hours as needed (pain.).   allopurinol 100 MG tablet Commonly known as: ZYLOPRIM Take 100 mg by mouth 2 (two) times daily.   amLODipine 5 MG tablet Commonly known as: NORVASC Take 1 tablet (5 mg total) by mouth daily.   aspirin EC 81 MG tablet Take 1 tablet (81 mg total) by mouth daily. What changed: when to take this   ciprofloxacin 500 MG tablet Commonly known as: Cipro Take 1 tablet (500 mg total) by mouth 2 (two) times daily for 10 days.   cyanocobalamin 500 MCG tablet Commonly known as: VITAMIN B12 Take 500 mcg by mouth every other day. In the morning.   escitalopram 10 MG tablet Commonly known as: LEXAPRO Take 10 mg by mouth in the morning.   finasteride 5 MG tablet Commonly known as: PROSCAR Take 5 mg by mouth at bedtime.   Fish Oil 1000 MG Caps Take 1,000 mg by mouth 2 (two) times daily.   furosemide 40 MG tablet Commonly known as: LASIX Take 20-40 mg by mouth See admin instructions. Take  1 tablet (40 mg) by mouth in the morning & take 0.5 tablet (20 mg) by mouth in the evening.   latanoprost 0.005 % ophthalmic solution Commonly known as: XALATAN Place 1 drop into both eyes at bedtime.   Metamucil 28.3 % Powd Generic drug: Psyllium Take 6 g by mouth in the morning. 8 oz of Water (2 teaspoons)   nitroGLYCERIN 0.4 MG SL tablet Commonly known as: NITROSTAT Place 1 tablet (0.4 mg total) under the tongue every 5 (five) minutes as needed for chest pain.   olmesartan 40 MG tablet Commonly known as: BENICAR Take 40 mg by mouth in the morning. Notes to patient: HOLD THIS MEDICATION UNTIL SEEN BACK IN OFFICE!   PRESERVISION AREDS 2 PO Take 1 tablet by mouth 2 (two) times daily.   Repatha SureClick 932 MG/ML Soaj Generic drug: Evolocumab Inject 140 mg into the skin every 14 (fourteen) days.   Vitamin C 500 MG Caps Take 500 mg by mouth at bedtime.            Outstanding Labs/Studies   BMET  Duration of Discharge Encounter   Greater than 30 minutes including physician time.  Signed, Angelena Form, PA-C 03/27/2022, 11:09 AM [phone]  ATTENDING ATTESTATION:  After conducting a review of all available clinical information with the care team, interviewing the patient, and performing a physical exam, I agree with the findings and plan described in this note.   GEN: No acute distress.   HEENT:  MMM, no JVD, no scleral icterus Cardiac: RRR, no murmurs, rubs, or gallops.  Respiratory: Clear to auscultation bilaterally. GI: Soft, nontender, non-distended  MS: No edema; No deformity. Neuro:  Nonfocal  Vasc:  +  2 radial pulses  Patient doing well after RTF 26S3 TAVR.  Access sites and conduction are stable.  No signs of TIA or CVA.  Will discharge today with close hospital follow up.  Follow Cr.  Matthew Sciara, MD Pager [phone]

## 2022-03-26 NOTE — Progress Notes (Signed)
LOCATION:  left RADIAL  DRESSING APPLIED: gauze with tegaderm applied to left wrist area  SITE UPON ARRIVAL: LEVEL 0  SITE AFTER a-line REMOVAL: LEVEL 0  CIRCULATION SENSATION AND MOVEMENT: + radial pulse, + movement  COMMENTS: a-line removed, manual pressure applied for approximately 6 minutes

## 2022-03-26 NOTE — Progress Notes (Signed)
  [facility] VALVE TEAM  Patient doing well s/p TAVR. He is hemodynamically stable. Groin sites stable. ECG with no high grade block. Arterial line discontinued and transferred to 4E. Given elevated LVEDP, will treat with one dose of IV lasix. Plan for early ambulation after bedrest completed and hopeful discharge over the next 24-48 hours.   Angelena Form PA-C  [facility]  Pager [phone]

## 2022-03-26 NOTE — Anesthesia Procedure Notes (Signed)
Arterial Line Insertion Start/End1/16/2024 10:00 AM, 03/26/2022 10:09 AM Performed by: Junie Bame, RN  Patient location: Pre-op. Preanesthetic checklist: patient identified, IV checked, site marked, risks and benefits discussed, surgical consent, monitors and equipment checked, pre-op evaluation, timeout performed and anesthesia consent Lidocaine 1% used for infiltration Left, radial was placed Catheter size: 20 G Hand hygiene performed  and maximum sterile barriers used   Attempts: 1 Procedure performed without using ultrasound guided technique. Following insertion, dressing applied and Biopatch. Post procedure assessment: normal and unchanged  Patient tolerated the procedure well with no immediate complications.

## 2022-03-26 NOTE — Interval H&P Note (Signed)
History and Physical Interval Note:  03/26/2022 9:15 AM  Matthew Mcneil  has presented today for surgery, with the diagnosis of Severe Aortic Stenosis.  The various methods of treatment have been discussed with the patient and family. After consideration of risks, benefits and other options for treatment, the patient has consented to  Procedure(s): Transcatheter Aortic Valve Replacement, Transfemoral (Right) INTRAOPERATIVE TRANSTHORACIC ECHOCARDIOGRAM (N/A) as a surgical intervention.  The patient's history has been reviewed, patient examined, no change in status, stable for surgery.  I have reviewed the patient's chart and labs.  Questions were answered to the patient's satisfaction.     Pierre Bali Antione Obar

## 2022-03-26 NOTE — Anesthesia Postprocedure Evaluation (Signed)
Anesthesia Post Note  Patient: Matthew Mcneil  Procedure(s) Performed: Transcatheter Aortic Valve Replacement, Transfemoral using Edwards 26 MM SAPIEN 3 Ultra (Right: Chest) INTRAOPERATIVE TRANSTHORACIC ECHOCARDIOGRAM     Patient location during evaluation: PACU Anesthesia Type: MAC Level of consciousness: awake and alert Pain management: pain level controlled Vital Signs Assessment: post-procedure vital signs reviewed and stable Respiratory status: spontaneous breathing, nonlabored ventilation, respiratory function stable and patient connected to nasal cannula oxygen Cardiovascular status: stable and blood pressure returned to baseline Postop Assessment: no apparent nausea or vomiting Anesthetic complications: no  No notable events documented.  Last Vitals:  Vitals:   03/26/22 1500 03/26/22 1515  BP: 120/64 (!) 121/58  Pulse: (!) 55 (!) 59  Resp: 17 16  Temp:    SpO2: 95% 95%    Last Pain:  Vitals:   03/26/22 1345  TempSrc: Temporal  PainSc:                  Tiajuana Amass

## 2022-03-26 NOTE — Discharge Instructions (Signed)
ACTIVITY AND EXERCISE  Daily activity and exercise are an important part of your recovery. People recover at different rates depending on their general health and type of valve procedure.  Most people recovering from TAVR feel better relatively quickly   No lifting, pushing, pulling more than 10 pounds (examples to avoid: groceries, vacuuming, gardening, golfing):             - For one week with a procedure through the groin.             - For six weeks for procedures through the chest wall or neck. NOTE: You will typically see one of our providers 7-14 days after your procedure to discuss WHEN TO RESUME the above activities.      DRIVING  Do not drive until you are seen for follow up and cleared by a provider. Generally, we ask patient to not drive for 1 week after their procedure.  If you have been told by your doctor in the past that you may not drive, you must talk with him/her before you begin driving again.   DRESSING  Groin site: you may leave the clear dressing over the site for up to one week or until it falls off.   HYGIENE  If you had a femoral (leg) procedure, you may take a shower when you return home. After the shower, pat the site dry. Do NOT use powder, oils or lotions in your groin area until the site has completely healed.  If you had a chest procedure, you may shower when you return home unless specifically instructed not to by your discharging practitioner.             - DO NOT scrub incision; pat dry with a towel.             - DO NOT apply any lotions, oils, powders to the incision.             - No tub baths / swimming for at least 2 weeks.  If you notice any fevers, chills, increased pain, swelling, bleeding or pus, please contact your doctor.   ADDITIONAL INFORMATION  If you are going to have an upcoming dental procedure, please contact our office as you will require antibiotics ahead of time to prevent infection on your heart valve.    If you have any questions  or concerns you can call the structural heart phone during normal business hours 8am-4pm. If you have an urgent need after hours or weekends please call [phone] to talk to the on call provider for general cardiology. If you have an emergency that requires immediate attention, please call 911.    After TAVR Checklist  Check  Test Description   Follow up appointment in 1-2 weeks  You will see our structural heart advanced practice provider. Your incision sites will be checked and you will be cleared to drive and resume all normal activities if you are doing well.     1 month echo and follow up  You will have an echo to check on your new heart valve and be seen back in the office by a structural heart advanced practice provider.   Follow up with your primary cardiologist You will need to be seen by your primary cardiologist in the following 3-6 months after your 1 month appointment in the valve clinic.    1 year echo and follow up You will have another echo to check on your heart valve after 1 year   and be seen back in the office by a structural heart advanced practice provider. This your last structural heart visit.   Bacterial endocarditis prophylaxis  You will have to take antibiotics for the rest of your life before all dental procedures (even teeth cleanings) to protect your heart valve. Antibiotics are also required before some surgeries. Please check with your cardiologist before scheduling any surgeries. Also, please make sure to tell us if you have a penicillin allergy as you will require an alternative antibiotic.

## 2022-03-27 ENCOUNTER — Inpatient Hospital Stay (HOSPITAL_COMMUNITY): Payer: Medicare HMO

## 2022-03-27 ENCOUNTER — Ambulatory Visit: Payer: Medicare HMO | Admitting: Cardiology

## 2022-03-27 ENCOUNTER — Encounter (HOSPITAL_COMMUNITY): Payer: Self-pay | Admitting: Internal Medicine

## 2022-03-27 DIAGNOSIS — I130 Hypertensive heart and chronic kidney disease with heart failure and stage 1 through stage 4 chronic kidney disease, or unspecified chronic kidney disease: Secondary | ICD-10-CM | POA: Diagnosis not present

## 2022-03-27 DIAGNOSIS — Z006 Encounter for examination for normal comparison and control in clinical research program: Secondary | ICD-10-CM | POA: Diagnosis not present

## 2022-03-27 DIAGNOSIS — Z952 Presence of prosthetic heart valve: Secondary | ICD-10-CM

## 2022-03-27 DIAGNOSIS — I350 Nonrheumatic aortic (valve) stenosis: Secondary | ICD-10-CM | POA: Diagnosis not present

## 2022-03-27 DIAGNOSIS — Z8616 Personal history of COVID-19: Secondary | ICD-10-CM | POA: Diagnosis not present

## 2022-03-27 LAB — ECHOCARDIOGRAM COMPLETE
AR max vel: 3.79 cm2
AV Area VTI: 3.15 cm2
AV Area mean vel: 2.48 cm2
AV Mean grad: 11 mmHg
AV Peak grad: 22.3 mmHg
Ao pk vel: 2.36 m/s
Area-P 1/2: 3.72 cm2
Height: 66 in
S' Lateral: 4.2 cm
Weight: 2987.67 oz

## 2022-03-27 LAB — BASIC METABOLIC PANEL
Anion gap: 9 (ref 5–15)
Anion gap: 11 (ref 5–15)
BUN: 23 mg/dL (ref 8–23)
BUN: 32 mg/dL — ABNORMAL HIGH (ref 8–23)
CO2: 27 mmol/L (ref 22–32)
CO2: 26 mmol/L (ref 22–32)
Calcium: 9.1 mg/dL (ref 8.9–10.3)
Calcium: 10.3 mg/dL (ref 8.9–10.3)
Chloride: 100 mmol/L (ref 98–111)
Chloride: 105 mmol/L (ref 98–111)
Creatinine, Ser: 0.47 mg/dL — ABNORMAL LOW (ref 0.61–1.24)
Creatinine, Ser: 1.78 mg/dL — ABNORMAL HIGH (ref 0.61–1.24)
GFR, Estimated: >60 mL/min (ref >60)
GFR, Estimated: 38 mL/min — ABNORMAL LOW (ref >60)
Glucose, Bld: 138 mg/dL — ABNORMAL HIGH (ref 70–99)
Glucose, Bld: 108 mg/dL — ABNORMAL HIGH (ref 70–99)
Potassium: 4.3 mmol/L (ref 3.5–5.1)
Potassium: 4.2 mmol/L (ref 3.5–5.1)
Sodium: 136 mmol/L (ref 135–145)
Sodium: 142 mmol/L (ref 135–145)

## 2022-03-27 LAB — CBC
HCT: 28.7 % — ABNORMAL LOW (ref 39.0–52.0)
Hemoglobin: 8.9 g/dL — ABNORMAL LOW (ref 13.0–17.0)
MCH: 29.2 pg (ref 26.0–34.0)
MCHC: 31 g/dL (ref 30.0–36.0)
MCV: 94.1 fL (ref 80.0–100.0)
Platelets: 555 10*3/uL — ABNORMAL HIGH (ref 150–400)
RBC: 3.05 MIL/uL — ABNORMAL LOW (ref 4.22–5.81)
RDW: 16.4 % — ABNORMAL HIGH (ref 11.5–15.5)
WBC: 10.1 10*3/uL (ref 4.0–10.5)
nRBC: 0 % (ref 0.0–0.2)

## 2022-03-27 LAB — MAGNESIUM: Magnesium: 1.8 mg/dL (ref 1.7–2.4)

## 2022-03-27 NOTE — Progress Notes (Signed)
  Echocardiogram 2D Echocardiogram has been performed.  Matthew Mcneil M 03/27/2022, 10:10 AM

## 2022-03-27 NOTE — Progress Notes (Signed)
CARDIAC REHAB PHASE I   PRE:  Rate/Rhythm: 71 sR    BP: sitting 135/61    SaO2: 96 RA  MODE:  Ambulation: 470 ft   POST:  Rate/Rhythm: 92 SR    BP: sitting 168/65     SaO2: 92 RA  Tolerated well, independent. C/o stiff knees. Discussed with wife and pt restrictions, walking for exercise, and CRPII. Will refer to [facility].  [phone]   Yves Dill BS, ACSM-CEP 03/27/2022 8:51 AM

## 2022-03-27 NOTE — TOC Transition Note (Signed)
Transition of Care (TOC) - CM/SW Discharge Note Marvetta Gibbons RN, BSN Transitions of Care Unit 4E- RN Case Manager See Treatment Team for direct phone #   Patient Details  Name: Matthew Mcneil MRN: [id number] Date of Birth: [birth year]  Transition of Care [facility]) CM/SW Contact:  Dawayne Patricia, RN Phone Number: 03/27/2022, 11:50 AM   Clinical Narrative:    Pt stable for transition home, Transition of Care Department [facility]) has reviewed patient and no TOC needs have been identified at this time. Family to transport home and follow up in place  Final next level of care: Home/Self Care Barriers to Discharge: No Barriers Identified   Patient Goals and CMS Choice   Choice offered to / list presented to : NA  Discharge Placement               Home          Discharge Plan and Services Additional resources added to the After Visit Summary for                  DME Arranged: N/A DME Agency: NA       HH Arranged: NA HH Agency: NA        Social Determinants of Health (SDOH) Interventions SDOH Screenings   Food Insecurity: No Food Insecurity (03/26/2022)  Housing: Low Risk  (03/26/2022)  Transportation Needs: No Transportation Needs (03/26/2022)  Utilities: Not At Risk (03/26/2022)  Tobacco Use: Low Risk  (03/27/2022)     Readmission Risk Interventions    03/27/2022   11:50 AM  Readmission Risk Prevention Plan  Post Dischage Appt Complete  Medication Screening Complete  Transportation Screening Complete

## 2022-03-27 NOTE — Op Note (Addendum)
OPERATIVE NOTE: Patient Name: Matthew Mcneil Date of Birth: [birth year] Date of Operation: 03/26/22  PRE-OPERATIVE DIAGNOSIS: Severe, Symptomatic Aortic Stenosis   POST-OPERATIVE DIAGNOSIS: Same   OPERATION: Transcatheter Aortic Valve Replacement - Transfemoral Approach    Edwards Sapien 3 Resilia THV (size 26 mm, model # 9755RLS, serial # [id number] )   SURGEON: Pierre Bali Ona Rathert MD   Co-Surgeon: Lenna Sciara, MD    EBL 10cc   FINDINGS: Good implant depth No significant AI   SPECIMENS: None   COMPLICATIONS: None   TUBES:  None   INDICATIONS: arold Mcneil 82 y.o. male referred in consultation for transcatheter aortic valve implantation.   + Severe, symptomatic aortic stenosis.   Meets criteria for AV replacement.   STS risk is 3.9% He does have moderate bilateral carotid stenosis and it is at increased risk for periprocedural stroke as well.    Risks/benefits/alternatives of TAVR were discussed at length (90% standard recovery, 6-9% morbidity [any organ, typically access site vascular complication needing surgery, stroke, or pacemaker] and <1% mortality. Options are TAVR, SAVR or medical treatment. Patient understands SAVR is typically higher risk and takes longer recovery than TAVR in elderly patients. As well, discussed that medical Tx yields around 50% mortality for severe, symptomatic aortic stenosis in 2 years if left untreated, and this is an option.       Valve: 26 Sapien (Area 453mm2) Bailout:  No (age) Access:  Transfemoral (min access diameter bilateral is 8.75mm) NYHA:II    PROCEDURE IN DETAIL: The patient was brought in the operating room and laid in supine position.  The patient was prepped and draped in standard fashion. Arterial and venous lines were placed by anesthesia along under sedation. After a timeout was performed, right and left transfemoral access was gained with ultrasound guidance. LV pacing was intended for use for pacing so no  additional venous access added.   On the left femoral artery, a 6Fr sheath was placed and wire and pigtail through this into the noncoronary cusp.    We then used micropuncture techniques and ultrasound to approach the right groin and gained access and placed 2 Perclose devices. Via an 8 Fr sheath an Amplatz wire was placed and then exchanged for a 14 Fr E-sheath. The patient was heparinized systemically and ACT verified > 250 seconds.   Then an AL caltheter was used with flexible straight tip to get into the LV. No BAV performed. Then exchanged over a pigtail for a pre-curled wire.  Direct LV pacing thresholds were assessed and found to be adequate.    An Edwards Sapien 3 THV (size 26 mm) was prepared and crimped per manufacturer's guidelines, and the proper orientation of the valve is confirmed on the [employer] delivery system. The valve was advanced through the introducer sheath using normal technique until in an appropriate position in the abdominal aorta beyond the sheath tip. The balloon was then retracted and using the fine-tuning wheel was centered on the valve. The valve was then advanced across the aortic arch using appropriate flexion of the catheter. The valve was carefully positioned across the aortic valve annulus. The Commander catheter was retracted using normal technique. Once final position of the valve has been confirmed by angiographic assessment, the valve is deployed while temporarily holding ventilation and during rapid ventricular pacing to maintain systolic blood pressure < 50 mmHg and pulse pressure < 10 mmHg. The balloon inflation is held for >3 seconds after reaching full deployment volume. Once the balloon has fully deflated  the balloon is retracted into the ascending aorta and valve function is assessed using TTE. There is felt to be no paravalvular leak and no central aortic insufficiency.  The patient's hemodynamic recovery following valve deployment is good.  The  deployment balloon and guidewire are both removed. Echo demostrated acceptable post-procedural gradients, stable mitral valve function, and no AI.  Implant depth was excellent. Hemodynamics were good. We then removed the sheath and deployed perclose x 2. Protamine was administered.    Sheaths were removed and closure devices had been completed. I defer to Dr. Ali Lowe regarding the Mynx closure of artery.    The patient had a stable status and was transferred to the postoperative care unit in stable condition. All surgical counts were correct.

## 2022-03-28 ENCOUNTER — Telehealth: Payer: Self-pay

## 2022-03-28 NOTE — Telephone Encounter (Signed)
Patient contacted regarding discharge from [facility] on 03/27/2022.  Patient understands to follow up with provider Structural Heart APP on 04/05/2022 at 10:00 AM at [facility] office. Patient understands discharge instructions? yes Patient understands medications and regiment? yes Patient understands to bring all medications to this visit? Yes  I spoke with the pt's daughter (pt is [facility] and spouse is currently at dentist) and she states that the pt is doing well.  He has been up walking this morning, denies pain and was able to rest comfortably last night.

## 2022-03-29 ENCOUNTER — Telehealth (HOSPITAL_COMMUNITY): Payer: Self-pay

## 2022-03-29 NOTE — Telephone Encounter (Signed)
Phase II referral for Cardiac Rehab faxed to [place]. 

## 2022-04-01 ENCOUNTER — Other Ambulatory Visit (HOSPITAL_COMMUNITY): Payer: Self-pay

## 2022-04-03 MED FILL — Potassium Chloride Inj 2 mEq/ML: INTRAVENOUS | Qty: 40 | Status: AC

## 2022-04-03 MED FILL — Magnesium Sulfate Inj 50%: INTRAMUSCULAR | Qty: 10 | Status: AC

## 2022-04-03 MED FILL — Heparin Sodium (Porcine) Inj 1000 Unit/ML: Qty: 1000 | Status: AC

## 2022-04-04 NOTE — Progress Notes (Signed)
[facility] [place]                                     Cardiology Office Note:    Date:  04/05/2022   ID:  Matthew Mcneil, DOB Feb 27, 1941, MRN [id number]  PCP:  Matthew Mina., MD  [facility] Cardiologist:  Matthew More, MD / Matthew. Ali Mcneil & Matthew. Tenny Mcneil (TAVR)  [facility] Electrophysiologist:  None   Referring MD: Matthew Mina., MD   [facility] s/p TAVR  History of Present Illness:    Matthew Mcneil is a 82 y.o. 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  He has been followed by Matthew Mcneil for some time. Matthew Mcneil noticed progressive DOE. Serial echocardiograms have demonstrated progression of aortic stenosis to severe with Matthew diminution in ejection fraction. Echo 01/08/22 showed EF 45-50%, and severe AS with mean grad 33.5 mmHg, peak grad 63.8 mmHg, AVA 0.68 cm2, DVI 0.24, mild AI, mild MR, small pericardial effusion. [facility] 02/20/22 showed minimal obstructive coronary artery disease of left dominant system. Pre TAVR CTs showed severe AS with anatomy suitable for TAVR as well as gas in Matthew urinary bladder, possible colovesical fistula, renal lesions as well as a left ureteropelvic junction stone.    Matthew Mcneil has been evaluated by Matthew multidisciplinary valve team and underwent successful TAVR with a 26 mm Edwards Sapien 3 Ultra Resilia THV via Matthew TF approach on 03/26/22. Post operative echo showed EF 50%, normally functioning TAVR with a mean gradient of 11 mmHg and no PVL.He was treated with one dose of IV lasix given elevated LVEDP. He developed mild AKI and Benicar was held. Discharged on a baby aspirin. Of note, PAT labs showed an abnormal UA. Given CT findings above, we discussed with urology who recommended antibiotic  suppression with Cipro until he could be seen by urology.   Today Matthew Mcneil presents to clinic for follow up. Here with wife. No CP or SOB. No LE edema, orthopnea or PND. He occasionally has dizziness c/w with orth stasis but no syncope. No blood in stool or urine. No palpitations.   Past Medical History:  Diagnosis Date   Anemia, chronic disease 04/04/2020   Anxiety 04/29/2016   Atherosclerosis of both carotid arteries 09/15/2018   Formatting of this note might be different from Matthew original. Follows with [facility].   Benign prostate hyperplasia 04/28/2015   Carotid artery occlusion    Chronic idiopathic gout 04/28/2015   Chronic kidney disease, stage III (moderate) ([place]) 04/28/2015   Colon polyp 04/28/2015   Coronary artery disease involving native coronary artery of native heart with angina pectoris ([place]) 05/06/2016   Essential hypertension 04/28/2015   History of COVID-19 05/20/2019   Formatting of this note might be different from Matthew original. Late January 2021.   Hyperlipidemia 04/28/2015   Hyperparathyroidism ([place]) 04/28/2015   Mild episode of recurrent major depressive disorder (Matthew Mcneil) 05/16/2016   Nonrheumatic aortic valve stenosis 09/15/2018   Formatting of this note might be different from Matthew original. Follows with [facility].   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 Matthew Mcneil) 04/03/2019   Venous stasis 09/07/2019   Vitamin B12 deficiency 04/28/2015    Past Surgical History:  Procedure Laterality Date   CARDIAC CATHETERIZATION     CATARACT EXTRACTION, BILATERAL     CORONARY ANGIOPLASTY     HEMORROIDECTOMY     INTRAOPERATIVE TRANSTHORACIC ECHOCARDIOGRAM N/A 03/26/2022   Procedure: INTRAOPERATIVE TRANSTHORACIC ECHOCARDIOGRAM;  Surgeon: Matthew Pyo, MD;   Location: [facility];  Service: Open Heart Surgery;  Laterality: N/A;   RIGHT/LEFT HEART CATH AND CORONARY ANGIOGRAPHY N/A 02/20/2022   Procedure: RIGHT/LEFT HEART CATH AND CORONARY ANGIOGRAPHY;  Surgeon: Matthew Pyo, MD;  Location: [facility] INVASIVE CV LAB;  Service: Cardiovascular;  Laterality: N/A;   TRANSCATHETER AORTIC VALVE REPLACEMENT, TRANSFEMORAL Right 03/26/2022   Procedure: Transcatheter Aortic Valve Replacement, Transfemoral using Edwards 26 MM SAPIEN 3 Ultra;  Surgeon: Matthew Pyo, MD;  Location: [facility] OR;  Service: Open Heart Surgery;  Laterality: Right;  Transfemoral    Current Medications: Current Meds  Medication Sig   acetaminophen (TYLENOL) 500 MG tablet Take 500-1,000 mg by mouth every 6 (six) hours as needed (pain.).   allopurinol (ZYLOPRIM) 100 MG tablet Take 100 mg by mouth 2 (two) times daily.   amLODipine (NORVASC) 5 MG tablet Take 1 tablet (5 mg total) by mouth daily.   amoxicillin (AMOXIL) 500 MG tablet Take 4 tablets (2,000 mg total) by mouth as directed. 1 hour prior to dental work including cleanings   Ascorbic Acid (VITAMIN C) 500 MG CAPS Take 500 mg by mouth at bedtime.   aspirin EC 81 MG tablet Take 1 tablet (81 mg total) by mouth daily. (Mcneil taking differently: Take 81 mg by mouth at bedtime.)   escitalopram (LEXAPRO) 10 MG tablet Take 10 mg by mouth in Matthew morning.   Evolocumab (REPATHA SURECLICK) 140 MG/ML SOAJ Inject 140 mg into Matthew skin every 14 (fourteen) days.   finasteride (PROSCAR) 5 MG tablet Take 5 mg by mouth at bedtime.   furosemide (LASIX) 40 MG tablet Take 20-40 mg by mouth See admin instructions. Take 1 tablet (40 mg) by mouth in Matthew morning & take 0.5 tablet (20 mg) by mouth in Matthew evening.   latanoprost (XALATAN) 0.005 % ophthalmic solution Place 1 drop into both eyes at bedtime.   Multiple Vitamins-Minerals (PRESERVISION AREDS 2 PO) Take 1 tablet by mouth 2 (two) times daily.   nitroGLYCERIN (NITROSTAT) 0.4 MG SL tablet Place 1 tablet (0.4 mg  total) under Matthew tongue every 5 (five) minutes as needed for chest pain.   Omega-3 Fatty Acids (FISH OIL) 1000 MG CAPS Take 1,000 mg by mouth 2 (two) times daily.   Psyllium (METAMUCIL) 28.3 % POWD Take 6 g by mouth in Matthew morning. 8 oz of Water (2 teaspoons)   vitamin B-12 (CYANOCOBALAMIN) 500 MCG tablet Take 500 mcg by mouth every other day. In Matthew morning.     Allergies:   Aldactone [spironolactone] and Statins   Social History   Socioeconomic History   Marital status: Married    Spouse name: Not on file   Number of children: Not on file   Years of education: Not on file   Highest education level: Not on file  Occupational History   Not on file  Tobacco Use   Smoking status: Never   Smokeless tobacco: Never  Vaping Use   Vaping Use: Never used  Substance and Sexual Activity  Alcohol use: Not Currently   Drug use: Not Currently   Sexual activity: Not Currently  Other Topics Concern   Not on file  Social History Narrative   Not on file   Social Determinants of Health   Financial Resource Strain: Not on file  Food Insecurity: No Food Insecurity (03/26/2022)   Hunger Vital Sign    Worried About Running Out of Food in Matthew Last Year: Never true    Ran Out of Food in Matthew Last Year: Never true  Transportation Needs: No Transportation Needs (03/26/2022)   PRAPARE - Administrator, Civil Service (Medical): No    Lack of Transportation (Non-Medical): No  Physical Activity: Not on file  Stress: Not on file  Social Connections: Not on file     Family History: Matthew Mcneil's family history includes Cancer in his mother; Diabetes in his brother and mother; Heart attack in his brother, father, and mother; Hypertension in his father and mother.  ROS:   Please see Matthew history of present illness.    All other systems reviewed and are negative.  EKGs/Labs/Other Studies Reviewed:    Matthew following studies were reviewed today:   [facility]  TAVR  OPERATIVE NOTE     Date of Procedure:                03/26/2022   Preoperative Diagnosis:      Severe Aortic Stenosis    Postoperative Diagnosis:    Same    Procedure:        Transcatheter Aortic Valve Replacement - Transfemoral Approach             Edwards Sapien 3 Resilia THV (size 26 mm, model # 9755RLS, serial # [id number] )              Co-Surgeons:                         Clare Charon, MD and Alverda Skeans, MD Anesthesiologist:                  Marcene Duos, MD   Echocardiographer:              Charlton Haws, MD   Pre-operative Echo Findings: Severe aortic stenosis Moderate left ventricular systolic dysfunction   Post-operative Echo Findings: No paravalvular leak Moderate left ventricular systolic dysfunction   Left Heart Catheterization Findings: Left ventricular end-diastolic pressure of  _____________     Echo 03/27/22: IMPRESSIONS  1. Hypokinesis of Matthew posterior wall with overall low normal to mildly  reduced LV function; s/p TAVR with mean gradient 11 mmHg and no AI.   2. Left ventricular ejection fraction, by estimation, is 50 to 55%. Matthew  left ventricle has low normal function. Matthew left ventricle demonstrates  regional wall motion abnormalities (see scoring diagram/findings for  description). There is mild asymmetric  left ventricular hypertrophy of Matthew basal-septal segment. Left ventricular  diastolic parameters are consistent with Grade I diastolic dysfunction  (impaired relaxation). Elevated left atrial pressure.   3. Right ventricular systolic function is normal. Matthew right ventricular  size is normal.   4. Left atrial size was mildly dilated.   5. Matthew mitral valve is degenerative. Trivial mitral valve regurgitation.  No evidence of mitral stenosis. Moderate mitral annular calcification.   6. Post TAVR: well positioned 26 mm Sapien 3 valve No PVL, mean 11 peak  22 mmHG. . Matthew aortic valve has  been repaired/replaced. Aortic valve   regurgitation is not visualized. No aortic stenosis is present. There is a  26 mm Sapien prosthetic (TAVR) valve  present in Matthew aortic position. Procedure Date: 03/26/2022.   7. Aortic dilatation noted. There is mild dilatation of Matthew aortic root,  measuring 38 mm.    EKG:  EKG is  ordered today.  Matthew ekg ordered today demonstrates sinus with sinus arrythmia HR 84 inferior Q wavees  Recent Labs: 02/13/2022: NT-Pro BNP 1,314 03/22/2022: ALT 23 03/27/2022: BUN 32; Creatinine, Ser 1.78; Hemoglobin 8.9; Magnesium 1.8; Platelets 555; Potassium 4.2; Sodium 142  Recent Lipid Panel    Component Value Date/Time   CHOL 121 01/16/2021 0857   TRIG 172 (H) 01/16/2021 0857   HDL 44 01/16/2021 0857   CHOLHDL 2.8 01/16/2021 0857   LDLCALC 48 01/16/2021 0857     Risk Assessment/Calculations:       Physical Exam:    VS:  BP (!) 170/74   Pulse 84   Ht 5\' 6"  (1.676 m)   Wt 191 lb 9.6 oz (86.9 kg)   SpO2 98%   BMI 30.93 kg/m     Wt Readings from Last 3 Encounters:  04/05/22 191 lb 9.6 oz (86.9 kg)  03/27/22 186 lb 11.7 oz (84.7 kg)  03/21/22 195 lb (88.5 kg)     GEN:  Well nourished, well developed in no acute distress HEENT: Normal NECK: No JVD LYMPHATICS: No lymphadenopathy CARDIAC: RRR, no murmurs, rubs, gallops RESPIRATORY:  Clear to auscultation without rales, wheezing or rhonchi  ABDOMEN: Soft, non-tender, non-distended MUSCULOSKELETAL:  No edema; No deformity  SKIN: Warm and dry.  Groin sites clear without hematoma or ecchymosis  NEUROLOGIC:  Alert and oriented x 3 PSYCHIATRIC:  Normal affect   ASSESSMENT:    1. S/P TAVR (transcatheter aortic valve replacement)   2. HFrEF (heart failure with reduced ejection fraction) (HCC)   3. Stage 3 chronic kidney disease, unspecified whether stage 3a or 3b CKD (HCC)   4. Colovesical fistula   5. Renal lesion    PLAN:    In order of problems listed above:  Severe AS s/p TAVR: doing great 1 week out from TAVR. ECG with no  HAVB. Groin sites healing well. SBE prophylaxis discussed; I have RX'd amoxicillin. Continue on Asprin 81mg  daily. We will see him back for 1 month follow up and echo in Feb.   HFrEF: appears euvolemic. EF improved to low normal 50-55% on POD 1 echo.    CKD stage IIIb: holding Benicar given mild bump in creat at discharge. BMET today and if stable will resume home Benicar 40 mg daily. BP is elevated today. This should improve with resumption of his ARB.    Possible colovesical fistula: pre op UA was abnormal with many leukocytes and bacteria. Pre TAVR CT done on 12/29 showed gas in urinary bladder with possible colovesical fistula. Discussed case with urology who recommended antibiotic suppression with Cipro until he could be seen by urology. Has an apt on 1/29 with Matthew. 1/30.    Renal lesions: pre TAVR CT showed "multiple renal lesions bilaterally, including an indeterminate high attenuation lesion extending exophytically from Matthew anterior aspect of Matthew lower pole of Matthew left kidney. Follow-up nonemergent outpatient abdominal MRI with and without IV gadolinium should be considered in Matthew near future to provide definitive characterization and exclude neoplasm." Will defer to urology who he is seeing 1/29 (printed out a copy of this to bring into Matthew. Cardell Peach  Medication Adjustments/Labs and Tests Ordered: Current medicines are reviewed at length with Matthew Mcneil today.  Concerns regarding medicines are outlined above.  Orders Placed This Encounter  Procedures   Basic metabolic panel   EKG 82-NFAO   Meds ordered this encounter  Medications   amoxicillin (AMOXIL) 500 MG tablet    Sig: Take 4 tablets (2,000 mg total) by mouth as directed. 1 hour prior to dental work including cleanings    Dispense:  12 tablet    Refill:  12    Order Specific Question:   Supervising Provider    Answer:   [place], Edgar    Mcneil Instructions  Medication Instructions:  Start Amoxicillin 500 mg, take 4  tablets by mouth 1 hour prior to dental procedures and cleanings.  Resume Olmesartan (Benicar)     *If you need a refill on your cardiac medications before your next appointment, please call your pharmacy*   Lab Work: [place] - today   If you have labs (blood work) drawn today and your tests are completely normal, you will receive your results only by: [place] (if you have MyChart) OR A paper copy in Matthew mail If you have any lab test that is abnormal or we need to change your treatment, we will call you to review Matthew results.   Testing/Procedures: None ordered    Follow-Up: Follow up as scheduled   Other Instructions     Signed, Angelena Form, PA-C  04/05/2022 10:53 AM    [place]

## 2022-04-05 ENCOUNTER — Ambulatory Visit: Payer: Medicare HMO | Attending: Physician Assistant | Admitting: Physician Assistant

## 2022-04-05 VITALS — BP 170/74 | HR 84 | Ht 66.0 in | Wt 191.6 lb

## 2022-04-05 DIAGNOSIS — I5020 Unspecified systolic (congestive) heart failure: Secondary | ICD-10-CM | POA: Diagnosis not present

## 2022-04-05 DIAGNOSIS — N289 Disorder of kidney and ureter, unspecified: Secondary | ICD-10-CM

## 2022-04-05 DIAGNOSIS — Z952 Presence of prosthetic heart valve: Secondary | ICD-10-CM

## 2022-04-05 DIAGNOSIS — N321 Vesicointestinal fistula: Secondary | ICD-10-CM

## 2022-04-05 DIAGNOSIS — N183 Chronic kidney disease, stage 3 unspecified: Secondary | ICD-10-CM | POA: Diagnosis not present

## 2022-04-05 MED ORDER — AMOXICILLIN 500 MG PO TABS: 2000.0000 mg | ORAL_TABLET | ORAL | 12 refills | Status: DC

## 2022-04-05 NOTE — Patient Instructions (Addendum)
Medication Instructions:  Start Amoxicillin 500 mg, take 4 tablets by mouth 1 hour prior to dental procedures and cleanings.  Resume Olmesartan (Benicar)     *If you need a refill on your cardiac medications before your next appointment, please call your pharmacy*   Lab Work: [place] - today   If you have labs (blood work) drawn today and your tests are completely normal, you will receive your results only by: [place] (if you have MyChart) OR A paper copy in the mail If you have any lab test that is abnormal or we need to change your treatment, we will call you to review the results.   Testing/Procedures: None ordered    Follow-Up: Follow up as scheduled   Other Instructions

## 2022-04-08 NOTE — Telephone Encounter (Signed)
-----  Message from Eileen Stanford, PA-C sent at 04/08/2022  8:39 AM EST ----- Labs look good. Safe to resume his Benicar which he should already be back on.

## 2022-04-10 LAB — BASIC METABOLIC PANEL
BUN/Creatinine Ratio: 20 (ref 10–24)
BUN: 27 mg/dL (ref 8–27)
CO2: 26 mmol/L (ref 20–29)
Calcium: 10.9 mg/dL — ABNORMAL HIGH (ref 8.6–10.2)
Chloride: 102 mmol/L (ref 96–106)
Creatinine, Ser: 1.34 mg/dL — ABNORMAL HIGH (ref 0.76–1.27)
Glucose: 95 mg/dL (ref 70–99)
Potassium: 4.5 mmol/L (ref 3.5–5.2)
Sodium: 143 mmol/L (ref 134–144)
eGFR: 53 mL/min/{1.73_m2} — ABNORMAL LOW (ref >59)

## 2022-04-18 ENCOUNTER — Other Ambulatory Visit: Payer: Self-pay | Admitting: Urology

## 2022-04-18 DIAGNOSIS — N281 Cyst of kidney, acquired: Secondary | ICD-10-CM

## 2022-04-26 NOTE — Progress Notes (Unsigned)
HEART AND [place]                                     Cardiology Office Note:    Date:  04/29/2022   ID:  Matthew Mcneil, DOB [birth year], MRN [id number]  PCP:  Raina Mina., MD  [facility] Cardiologist:  Shirlee More, MD  [facility] Electrophysiologist:  None   Referring MD: Raina Mina., MD   Chief Complaint  Patient presents with   Follow-up    1 month s/p TAVR   History of Present Illness:    Matthew Mcneil is a 82 y.o. 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   Mr. [name] been followed by Dr. Bettina Gavia for some time. The patient noticed progressive DOE. Serial echocardiograms have demonstrated progression of aortic stenosis to severe with the diminution in ejection fraction. Echo 01/08/22 showed EF 45-50%, and severe AS with mean grad 33.5 mmHg, peak grad 63.8 mmHg, AVA 0.68 cm2, DVI 0.24, mild AI, mild MR, small pericardial effusion. [facility] 02/20/22 showed minimal obstructive coronary artery disease of left dominant system. Pre TAVR CTs showed severe AS with anatomy suitable for TAVR as well as gas in the urinary bladder, possible colovesical fistula, renal lesions as well as a left ureteropelvic junction stone.    The patient has been evaluated by the multidisciplinary valve team and underwent successful TAVR with a 26 mm Edwards Sapien 3 Ultra Resilia THV via the TF approach on 03/26/22. Post operative echo showed EF 50%, normally functioning TAVR with a mean gradient of 11 mmHg and no PVL.He was treated with one dose of IV lasix given elevated LVEDP. He developed mild AKI and Benicar was held. Discharged on ASA 75m QD. Of note, PAT labs showed an abnormal UA. Given CT findings above, we  discussed with urology who recommended antibiotic suppression with Cipro until he could be seen by urology.    In TOC follow up, he was doing very well with no complaints. Today he is here with his wife and continues to do well with no issues including chest pain, dizziness, SOB, LE edema, bleeding in stool or urine, orthopnea, dizziness, or syncope. He is frustrated with his macular degeneration and the fact that he cannot see to drive and do the things he loves. They are frustrated today with th eurology team as it has been very hectic trying to get his CT and MRI scheduled with appropriate urology follow up. They are leaving after this appointment to go speak with someone at their office.   Past Medical History:  Diagnosis Date   Anemia, chronic disease 04/04/2020   Anxiety 04/29/2016   Atherosclerosis of both carotid arteries 09/15/2018   Formatting of this note might be different from the original. [facility]   Benign prostate hyperplasia 04/28/2015   Carotid artery occlusion    Chronic idiopathic gout 04/28/2015   Chronic kidney disease, stage III (moderate) ([place] 04/28/2015   Colon polyp 04/28/2015   Coronary artery disease involving native coronary artery of native heart with angina pectoris ([place] 05/06/2016   Essential hypertension 04/28/2015   History of COVID-19 05/20/2019   Formatting of this note might  be different from the original. Late January 2021.   Hyperlipidemia 04/28/2015   Hyperparathyroidism ([place]) 04/28/2015   Mild episode of recurrent major depressive disorder ([place]) 05/16/2016   Nonrheumatic aortic valve stenosis 09/15/2018   Formatting of this note might be different from the original. Follows with [facility].   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 [facility]) 04/03/2019   Venous stasis 09/07/2019   Vitamin B12 deficiency 04/28/2015    Past Surgical History:  Procedure Laterality Date   CARDIAC CATHETERIZATION     CATARACT EXTRACTION, BILATERAL     CORONARY ANGIOPLASTY     HEMORROIDECTOMY     INTRAOPERATIVE TRANSTHORACIC ECHOCARDIOGRAM N/A 03/26/2022   Procedure: INTRAOPERATIVE TRANSTHORACIC ECHOCARDIOGRAM;  Surgeon: Early Osmond, MD;  Location: [place];  Service: Open Heart Surgery;  Laterality: N/A;   RIGHT/LEFT HEART CATH AND CORONARY ANGIOGRAPHY N/A 02/20/2022   Procedure: RIGHT/LEFT HEART CATH AND CORONARY ANGIOGRAPHY;  Surgeon: Early Osmond, MD;  Location: [place] CV LAB;  Service: Cardiovascular;  Laterality: N/A;   TRANSCATHETER AORTIC VALVE REPLACEMENT, TRANSFEMORAL Right 03/26/2022   Procedure: Transcatheter Aortic Valve Replacement, Transfemoral using Edwards 26 MM SAPIEN 3 Ultra;  Surgeon: Early Osmond, MD;  Location: [place];  Service: Open Heart Surgery;  Laterality: Right;  Transfemoral    Current Medications: Current Meds  Medication Sig   acetaminophen (TYLENOL) 500 MG tablet Take 500-1,000 mg by mouth every 6 (six) hours as needed (pain.).   allopurinol (ZYLOPRIM) 100 MG tablet Take 100 mg by mouth 2 (two) times daily.   amLODipine (NORVASC) 5 MG tablet Take 1 tablet (5 mg total) by mouth daily.   amoxicillin (AMOXIL) 500 MG tablet Take 4 tablets (2,000 mg total) by mouth as directed. 1 hour prior to dental work including cleanings   Ascorbic Acid (VITAMIN C) 500 MG CAPS Take 500 mg by mouth at bedtime.   aspirin EC 81 MG tablet Take 1 tablet (81 mg total) by mouth daily. (Patient taking differently: Take 81 mg by mouth at bedtime.)   escitalopram (LEXAPRO) 10 MG tablet Take 10 mg by mouth in the morning.   Evolocumab (REPATHA SURECLICK) [id number] MG/ML SOAJ Inject 140 mg into the skin every 14 (fourteen) days.   finasteride (PROSCAR) 5 MG tablet Take 5 mg by mouth at bedtime.   furosemide (LASIX) 40 MG  tablet Take 20-40 mg by mouth See admin instructions. Take 1 tablet (40 mg) by mouth in the morning & take 0.5 tablet (20 mg) by mouth in the evening.   latanoprost (XALATAN) 0.005 % ophthalmic solution Place 1 drop into both eyes at bedtime.   Multiple Vitamins-Minerals (PRESERVISION AREDS 2 PO) Take 1 tablet by mouth 2 (two) times daily.   nitroGLYCERIN (NITROSTAT) 0.4 MG SL tablet Place 1 tablet (0.4 mg total) under the tongue every 5 (five) minutes as needed for chest pain.   olmesartan (BENICAR) 40 MG tablet Take 40 mg by mouth in the morning.   Omega-3 Fatty Acids (FISH OIL) 1000 MG CAPS Take 1,000 mg by mouth 2 (two) times daily.   Psyllium (METAMUCIL) 28.3 % POWD Take 6 g by mouth in the morning. 8 oz of Water (2 teaspoons)   vitamin B-12 (CYANOCOBALAMIN) 500 MCG tablet Take 500 mcg by mouth every other day. In  the morning.     Allergies:   Aldactone [spironolactone] and Statins   Social History   Socioeconomic History   Marital status: Married    Spouse name: Not on file   Number of children: Not on file   Years of education: Not on file   Highest education level: Not on file  Occupational History   Not on file  Tobacco Use   Smoking status: Never   Smokeless tobacco: Never  Vaping Use   Vaping Use: Never used  Substance and Sexual Activity   Alcohol use: Not Currently   Drug use: Not Currently   Sexual activity: Not Currently  Other Topics Concern   Not on file  Social History Narrative   Not on file   Social Determinants of Health   Financial Resource Strain: Not on file  Food Insecurity: No Food Insecurity (03/26/2022)   Hunger Vital Sign    Worried About Running Out of Food in the Last Year: Never true    Ran Out of Food in the Last Year: Never true  Transportation Needs: No Transportation Needs (03/26/2022)   PRAPARE - Hydrologist (Medical): No    Lack of Transportation (Non-Medical): No  Physical Activity: Not on file  Stress:  Not on file  Social Connections: Not on file     Family History: The patient's family history includes Cancer in his mother; Diabetes in his brother and mother; Heart attack in his brother, father, and mother; Hypertension in his father and mother.  ROS:   Please see the history of present illness.    All other systems reviewed and are negative.  EKGs/Labs/Other Studies Reviewed:    The following studies were reviewed today:   Echocardiogram 04/29/22:   1. Left ventricular ejection fraction, by estimation, is 50 to 55%. The  left ventricle has low normal function. The left ventricle has no regional  wall motion abnormalities. Left ventricular diastolic parameters are  indeterminate.   2. Right ventricular systolic function is normal. The right ventricular  size is normal. There is normal pulmonary artery systolic pressure.   3. Left atrial size was mildly dilated.   4. Right atrial size was mildly dilated.   5. Mild mitral valve regurgitation. Moderate mitral annular  calcification.   6. S/p TAVR ( 26 mm Edwards S3UR, procedure date 03/26/22) [place] and mean  gradients through the valve are 28 and 15 mm Hg AVA (VTI) is 1.75 cm2.  Dimensionless index is 0.42 Compared to echo from 03/27/22 there is a  slight increase in mean gradient (11 to  15 mm Hg ). . The aortic valve has been repaired/replaced. Aortic valve  regurgitation is not visualized.   7. The inferior vena cava is normal in size with greater than 50%  respiratory variability, suggesting right atrial pressure of 3 mmHg.    [facility]ENTER  TAVR OPERATIVE NOTE     Date of Procedure:                03/26/2022   Preoperative Diagnosis:      Severe Aortic Stenosis    Postoperative Diagnosis:    Same    Procedure:        Transcatheter Aortic Valve Replacement - Transfemoral Approach             Edwards Sapien 3 Resilia THV (size 26 mm, model # 9755RLS, serial # [id number] )  Co-Surgeons:                          Justice Rocher, MD and Lenna Sciara, MD Anesthesiologist:                  Suzette Battiest, MD   Echocardiographer:              Jenkins Rouge, MD   Pre-operative Echo Findings: Severe aortic stenosis Moderate left ventricular systolic dysfunction   Post-operative Echo Findings: No paravalvular leak Moderate left ventricular systolic dysfunction   Left Heart Catheterization Findings: Left ventricular end-diastolic pressure of [id number]   _____________     Echo 03/27/22: IMPRESSIONS  1. Hypokinesis of the posterior wall with overall low normal to mildly  reduced LV function; s/p TAVR with mean gradient 11 mmHg and no AI.   2. Left ventricular ejection fraction, by estimation, is 50 to 55%. The  left ventricle has low normal function. The left ventricle demonstrates  regional wall motion abnormalities (see scoring diagram/findings for  description). There is mild asymmetric  left ventricular hypertrophy of the basal-septal segment. Left ventricular  diastolic parameters are consistent with Grade I diastolic dysfunction  (impaired relaxation). Elevated left atrial pressure.   3. Right ventricular systolic function is normal. The right ventricular  size is normal.   4. Left atrial size was mildly dilated.   5. The mitral valve is degenerative. Trivial mitral valve regurgitation.  No evidence of mitral stenosis. Moderate mitral annular calcification.   6. Post TAVR: well positioned 26 mm Sapien 3 valve No PVL, mean 11 peak  22 mmHG. . The aortic valve has been repaired/replaced. Aortic valve  regurgitation is not visualized. No aortic stenosis is present. There is a  26 mm Sapien prosthetic (TAVR) valve  present in the aortic position. Procedure Date: 03/26/2022.   7. Aortic dilatation noted. There is mild dilatation of the aortic root,  measuring 38 mm.   EKG:  EKG is not ordered today.  T  Recent Labs: 02/13/2022: NT-Pro BNP 1,314 03/22/2022: ALT 23 03/27/2022:  Hemoglobin 8.9; Magnesium 1.8; Platelets 555 04/05/2022: BUN 27; Creatinine, Ser 1.34; Potassium 4.5; Sodium 143  Recent Lipid Panel    Component Value Date/Time   CHOL 121 01/16/2021 0857   TRIG 172 (H) 01/16/2021 0857   HDL 44 01/16/2021 0857   CHOLHDL 2.8 01/16/2021 0857   LDLCALC 48 01/16/2021 0857    Physical Exam:    VS:  BP (!) 140/72   Pulse 73   Ht 5' 6"$  (1.676 m)   Wt 188 lb (85.3 kg)   SpO2 98%   BMI 30.34 kg/m     Wt Readings from Last 3 Encounters:  04/29/22 188 lb (85.3 kg)  04/05/22 191 lb 9.6 oz (86.9 kg)  03/27/22 186 lb 11.7 oz (84.7 kg)    General: Well developed, well nourished, NAD Lungs:Clear to ausculation bilaterally. No wheezes, rales, or rhonchi. Breathing is unlabored. Cardiovascular: RRR with S1 S2. Soft flow murmur Extremities: No edema.  Neuro: Alert and oriented. No focal deficits. No facial asymmetry. MAE spontaneously. Psych: Responds to questions appropriately with normal affect.    ASSESSMENT/PLAN:    Severe AS s/p TAVR: Patient doing well with NYHA class I symptoms s/p TAVR. Echo today with stable valve function with slightly increased mean gradient from 58mHg to 151mg today. Otherwise no evidence of PVL. Will require lifelong dental SBE prophylaxis with amoxicillin. Continue on Asprin 8117maily. Plan follow  up with our team in one year with echocardiogram. He will follow with Dr. Bettina Gavia in the interm.    HFrEF: Appears euvolemic today with no changes needed at this time  CKD stage IIIb: Labs BMET with stable Cr at 1.34. Sees PCP this week with labs.    Possible colovesical fistula: Pre op UA was abnormal with many leukocytes and bacteria. Pre TAVR CT done on 12/29 showed gas in urinary bladder with possible colovesical fistula. Discussed case with urology who recommended antibiotic suppression with Cipro until he could be seen by urology. Appt on 1/29 with Dr. Pollyann Kennedy for CT/MRI and follow up thereafter. Awaiting scheduling of  these.    Renal lesions: pre TAVR CT showed "multiple renal lesions bilaterally, including an indeterminate high attenuation lesion extending exophytically from the anterior aspect of the lower pole of the left kidney. Follow-up nonemergent outpatient abdominal MRI with and without IV gadolinium should be considered in the near future to provide definitive characterization and exclude neoplasm." Will defer to urology who he is seeing 1/29 (printed out a copy of this to bring into Dr. Abner Greenspan). Planned to have CT and MRI performed however awaiting these to be scheduled>>then will follow up results with urology team.   Medication Adjustments/Labs and Tests Ordered: Current medicines are reviewed at length with the patient today.  Concerns regarding medicines are outlined above.  No orders of the defined types were placed in this encounter.  No orders of the defined types were placed in this encounter.   Patient Instructions  Medication Instructions:  Your physician recommends that you continue on your current medications as directed. Please refer to the Current Medication list given to you today.  *If you need a refill on your cardiac medications before your next appointment, please call your pharmacy*   Lab Work: NONE If you have labs (blood work) drawn today and your tests are completely normal, you will receive your results only by: [place] (if you have MyChart) OR A paper copy in the mail If you have any lab test that is abnormal or we need to change your treatment, we will call you to review the results.   Testing/Procedures: NONE   Follow-Up: At [facility], you and your health needs are our priority.  As part of our continuing mission to provide you with exceptional heart care, we have created designated Provider Care Teams.  These Care Teams include your primary Cardiologist (physician) and Advanced Practice Providers (APPs -  Physician Assistants and Nurse  Practitioners) who all work together to provide you with the care you need, when you need it.  We recommend signing up for the patient portal called "MyChart".  Sign up information is provided on this After Visit Summary.  MyChart is used to connect with patients for Virtual Visits (Telemedicine).  Patients are able to view lab/test results, encounter notes, upcoming appointments, etc.  Non-urgent messages can be sent to your provider as well.   To learn more about what you can do with MyChart, go to [email or web address].    Your next appointment:   KEEP SCHEDULED FOLLOW-UP   Signed, Kathyrn Drown, NP  04/29/2022 4:05 PM    [facility]

## 2022-04-29 ENCOUNTER — Ambulatory Visit (HOSPITAL_BASED_OUTPATIENT_CLINIC_OR_DEPARTMENT_OTHER): Payer: Medicare HMO

## 2022-04-29 ENCOUNTER — Other Ambulatory Visit: Payer: Self-pay | Admitting: Cardiology

## 2022-04-29 ENCOUNTER — Ambulatory Visit: Payer: Medicare HMO | Attending: Cardiology | Admitting: Cardiology

## 2022-04-29 VITALS — BP 140/72 | HR 73 | Ht 66.0 in | Wt 188.0 lb

## 2022-04-29 DIAGNOSIS — N183 Chronic kidney disease, stage 3 unspecified: Secondary | ICD-10-CM

## 2022-04-29 DIAGNOSIS — I350 Nonrheumatic aortic (valve) stenosis: Secondary | ICD-10-CM

## 2022-04-29 DIAGNOSIS — I10 Essential (primary) hypertension: Secondary | ICD-10-CM

## 2022-04-29 DIAGNOSIS — I5020 Unspecified systolic (congestive) heart failure: Secondary | ICD-10-CM

## 2022-04-29 DIAGNOSIS — Z952 Presence of prosthetic heart valve: Secondary | ICD-10-CM | POA: Diagnosis not present

## 2022-04-29 DIAGNOSIS — I25119 Atherosclerotic heart disease of native coronary artery with unspecified angina pectoris: Secondary | ICD-10-CM

## 2022-04-29 DIAGNOSIS — F419 Anxiety disorder, unspecified: Secondary | ICD-10-CM

## 2022-04-29 DIAGNOSIS — N289 Disorder of kidney and ureter, unspecified: Secondary | ICD-10-CM

## 2022-04-29 DIAGNOSIS — N321 Vesicointestinal fistula: Secondary | ICD-10-CM

## 2022-04-29 LAB — ECHOCARDIOGRAM COMPLETE
AR max vel: 1.72 cm2
AV Area VTI: 1.73 cm2
AV Area mean vel: 1.66 cm2
AV Mean grad: 15 mmHg
AV Peak grad: 27.5 mmHg
Ao pk vel: 2.62 m/s
Area-P 1/2: 3.68 cm2
S' Lateral: 3.9 cm

## 2022-04-29 NOTE — Patient Instructions (Signed)
Medication Instructions:  Your physician recommends that you continue on your current medications as directed. Please refer to the Current Medication list given to you today.  *If you need a refill on your cardiac medications before your next appointment, please call your pharmacy*   Lab Work: NONE If you have labs (blood work) drawn today and your tests are completely normal, you will receive your results only by: [place] (if you have MyChart) OR A paper copy in the mail If you have any lab test that is abnormal or we need to change your treatment, we will call you to review the results.   Testing/Procedures: NONE   Follow-Up: At [facility], you and your health needs are our priority.  As part of our continuing mission to provide you with exceptional heart care, we have created designated Provider Care Teams.  These Care Teams include your primary Cardiologist (physician) and Advanced Practice Providers (APPs -  Physician Assistants and Nurse Practitioners) who all work together to provide you with the care you need, when you need it.  We recommend signing up for the patient portal called "MyChart".  Sign up information is provided on this After Visit Summary.  MyChart is used to connect with patients for Virtual Visits (Telemedicine).  Patients are able to view lab/test results, encounter notes, upcoming appointments, etc.  Non-urgent messages can be sent to your provider as well.   To learn more about what you can do with MyChart, go to [email or web address].    Your next appointment:   KEEP SCHEDULED FOLLOW-UP

## 2022-05-08 ENCOUNTER — Ambulatory Visit
Admission: RE | Admit: 2022-05-08 | Discharge: 2022-05-08 | Disposition: A | Discharge status: Home / Self Care | Payer: Medicare HMO | Source: Ambulatory Visit | Attending: Urology | Admitting: Urology

## 2022-05-08 DIAGNOSIS — T466X5A Adverse effect of antihyperlipidemic and antiarteriosclerotic drugs, initial encounter: Secondary | ICD-10-CM | POA: Insufficient documentation

## 2022-05-08 DIAGNOSIS — N321 Vesicointestinal fistula: Principal | ICD-10-CM | POA: Diagnosis present

## 2022-05-08 DIAGNOSIS — N2889 Other specified disorders of kidney and ureter: Secondary | ICD-10-CM | POA: Insufficient documentation

## 2022-05-08 DIAGNOSIS — N281 Cyst of kidney, acquired: Secondary | ICD-10-CM

## 2022-05-08 HISTORY — DX: Vesicointestinal fistula: N32.1

## 2022-05-08 HISTORY — DX: Other specified disorders of kidney and ureter: N28.89

## 2022-05-08 HISTORY — DX: Adverse effect of antihyperlipidemic and antiarteriosclerotic drugs, initial encounter: T46.6X5A

## 2022-05-08 MED ORDER — GADOPICLENOL 0.5 MMOL/ML IV SOLN
7.5000 mL | Freq: Once | INTRAVENOUS | Status: AC | PRN
  Administered 2022-05-08: 7.5 mL via INTRAVENOUS

## 2022-06-11 ENCOUNTER — Telehealth: Payer: Self-pay | Admitting: *Deleted

## 2022-06-11 ENCOUNTER — Encounter: Payer: Self-pay | Admitting: Gastroenterology

## 2022-06-11 ENCOUNTER — Ambulatory Visit: Payer: Self-pay | Admitting: Surgery

## 2022-06-11 ENCOUNTER — Telehealth: Payer: Self-pay

## 2022-06-11 DIAGNOSIS — Z8719 Personal history of other diseases of the digestive system: Secondary | ICD-10-CM

## 2022-06-11 DIAGNOSIS — R739 Hyperglycemia, unspecified: Secondary | ICD-10-CM

## 2022-06-11 HISTORY — DX: Personal history of other diseases of the digestive system: Z87.19

## 2022-06-11 NOTE — Telephone Encounter (Signed)
   Name: Matthew Mcneil  DOB: Apr 07, 1940  MRN: [id number]  Primary Cardiologist: Shirlee More, MD   Preoperative team, please contact this patient and set up a phone call appointment for further preoperative risk assessment. Please obtain consent and complete medication review. Thank you for your help.  I confirm that guidance regarding antiplatelet and oral anticoagulation therapy has been completed and, if necessary, noted below.  Regarding ASA therapy, we recommend continuation of ASA throughout the perioperative period.  However, if the surgeon feels that cessation of ASA is required in the perioperative period, it may be stopped 5-7 days prior to surgery with a plan to resume it as soon as felt to be feasible from a surgical standpoint in the post-operative period.    Mable Fill, Marissa Nestle, NP 06/11/2022, 3:21 PM [place]

## 2022-06-11 NOTE — Telephone Encounter (Signed)
   Pre-operative Risk Assessment    Patient Name: Matthew Mcneil  DOB: 07-Aug-1940 MRN: [id number]      Request for Surgical Clearance    Procedure:   Colovesical Fistula Repair  Date of Surgery:  Clearance TBD                                 Surgeon:  Dr Michael Boston, MD Surgeon's Group or Practice Name:  [facility] Phone number:  [phone] Fax number:  [phone]   Type of Clearance Requested:   - Medical    Type of Anesthesia:  General    Additional requests/questions:    Daylene Katayama   06/11/2022, 2:48 PM

## 2022-06-11 NOTE — Telephone Encounter (Signed)
  Patient Consent for Virtual Visit         Matthew Mcneil has provided verbal consent on 06/11/2022 for a virtual visit (video or telephone).   CONSENT FOR VIRTUAL VISIT FOR:  Matthew Mcneil  By participating in this virtual visit I agree to the following:  I hereby voluntarily request, consent and authorize [place] and its employed or contracted physicians, physician assistants, nurse practitioners or other licensed health care professionals (the Practitioner), to provide me with telemedicine health care services (the "Services") as deemed necessary by the treating Practitioner. I acknowledge and consent to receive the Services by the Practitioner via telemedicine. I understand that the telemedicine visit will involve communicating with the Practitioner through live audiovisual communication technology and the disclosure of certain medical information by electronic transmission. I acknowledge that I have been given the opportunity to request an in-person assessment or other available alternative prior to the telemedicine visit and am voluntarily participating in the telemedicine visit.  I understand that I have the right to withhold or withdraw my consent to the use of telemedicine in the course of my care at any time, without affecting my right to future care or treatment, and that the Practitioner or I may terminate the telemedicine visit at any time. I understand that I have the right to inspect all information obtained and/or recorded in the course of the telemedicine visit and may receive copies of available information for a reasonable fee.  I understand that some of the potential risks of receiving the Services via telemedicine include:  Delay or interruption in medical evaluation due to technological equipment failure or disruption; Information transmitted may not be sufficient (e.g. poor resolution of images) to allow for appropriate medical decision making by the  Practitioner; and/or  In rare instances, security protocols could fail, causing a breach of personal health information.  Furthermore, I acknowledge that it is my responsibility to provide information about my medical history, conditions and care that is complete and accurate to the best of my ability. I acknowledge that Practitioner's advice, recommendations, and/or decision may be based on factors not within their control, such as incomplete or inaccurate data provided by me or distortions of diagnostic images or specimens that may result from electronic transmissions. I understand that the practice of medicine is not an exact science and that Practitioner makes no warranties or guarantees regarding treatment outcomes. I acknowledge that a copy of this consent can be made available to me via my patient portal ([place]), or I can request a printed copy by calling the office of [place].    I understand that my insurance will be billed for this visit.   I have read or had this consent read to me. I understand the contents of this consent, which adequately explains the benefits and risks of the Services being provided via telemedicine.  I have been provided ample opportunity to ask questions regarding this consent and the Services and have had my questions answered to my satisfaction. I give my informed consent for the services to be provided through the use of telemedicine in my medical care

## 2022-06-12 ENCOUNTER — Telehealth: Payer: Self-pay | Admitting: Gastroenterology

## 2022-06-12 NOTE — Telephone Encounter (Signed)
Dr. Lyndel Safe,  We received an urgent referral from [facility] for elevated LFT's and to elevate for colovesicular fistula.  Patient needs a colonoscopy scheduled coordinating with surgery date. (Records in [facility]).  Patient last saw you in 2010 in [place].  Please review and advise urgency and scheduling.  Thanks [employer]

## 2022-06-13 NOTE — Telephone Encounter (Signed)
Needs office visit  in APP clinic or mine, whichever is earlier [place]

## 2022-06-14 ENCOUNTER — Encounter: Payer: Self-pay | Admitting: Physician Assistant

## 2022-06-17 ENCOUNTER — Ambulatory Visit: Payer: Medicare HMO | Attending: Internal Medicine

## 2022-06-17 DIAGNOSIS — Z01810 Encounter for preprocedural cardiovascular examination: Secondary | ICD-10-CM

## 2022-06-17 NOTE — Progress Notes (Signed)
Virtual Visit via Telephone Note   Because of Matthew Mcneil's co-morbid illnesses, he is at least at moderate risk for complications without adequate follow up.  This format is felt to be most appropriate for this patient at this time.  The patient did not have access to video technology/had technical difficulties with video requiring transitioning to audio format only (telephone).  All issues noted in this document were discussed and addressed.  No physical exam could be performed with this format.  Please refer to the patient's chart for his consent to telehealth for [facility].  Evaluation Performed:  Preoperative cardiovascular risk assessment _____________   Date:  06/17/2022   Patient ID:  Matthew Mcneil, DOB 04/15/1940, MRN [id number] Patient Location:  Home Provider location:   Office  Primary Care Provider:  Gordan PaymentGrisso, Greg A., MD Primary Cardiologist:  Norman HerrlichBrian Munley, MD  Chief Complaint / Patient Profile   82 y.o. y/o male with a h/o transcatheter aortic valve replacement, HFrEF, stage III CKD, aortic stenosis, HTN, anxiety, CAD who is pending Colovesical Fistula Repair  and presents today for telephonic preoperative cardiovascular risk assessment.  History of Present Illness    Matthew Mcneil is a 82 y.o. male who presents via audio/video conferencing for a telehealth visit today.  Pt was last seen in cardiology clinic on 04/29/2022 by Georgie ChardJill McDaniel, NP-C.  At that time Matthew Mcneil was doing well .  The patient is now pending procedure as outlined above. Since his last visit, he remains stable from a cardiac standpoint.  Today he denies chest pain, shortness of breath, lower extremity edema, fatigue, palpitations, melena, hematuria, hemoptysis, diaphoresis, weakness, presyncope, syncope, orthopnea, and PND.   Past Medical History    Past Medical History:  Diagnosis Date   Anemia, chronic disease 04/04/2020   Anxiety 04/29/2016    Atherosclerosis of both carotid arteries 09/15/2018   Formatting of this note might be different from the original. [facility]   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 Late January 2021.   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 [facility]   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 [facility](HCC) 04/03/2019   Venous stasis 09/07/2019   Vitamin B12 deficiency 04/28/2015   Past Surgical History:  Procedure Laterality Date   CARDIAC CATHETERIZATION     CATARACT EXTRACTION, BILATERAL     CORONARY ANGIOPLASTY     HEMORROIDECTOMY     INTRAOPERATIVE TRANSTHORACIC ECHOCARDIOGRAM N/A 03/26/2022   Procedure: INTRAOPERATIVE TRANSTHORACIC ECHOCARDIOGRAM;  Surgeon: Orbie Pyohukkani, Arun K, MD;  [facility];  Service: Open Heart Surgery;  Laterality: N/A;   RIGHT/LEFT HEART CATH AND CORONARY ANGIOGRAPHY N/A 02/20/2022   Procedure: RIGHT/LEFT HEART CATH AND CORONARY ANGIOGRAPHY;  Surgeon: Alverda Skeanshukkani, Arun  K, MD;  Location: [facility] INVASIVE CV LAB;  Service: Cardiovascular;  Laterality: N/A;   TRANSCATHETER AORTIC VALVE REPLACEMENT, TRANSFEMORAL Right 03/26/2022   Procedure: Transcatheter  Aortic Valve Replacement, Transfemoral using Edwards 26 MM SAPIEN 3 Ultra;  Surgeon: [name], MD;  [facility];  Service: Open Heart Surgery;  Laterality: Right;  Transfemoral    Allergies  Allergies  Allergen Reactions   Aldactone [Spironolactone] Other (See Comments)    Patient was in a panic   Statins Other (See Comments)    Myalgia     Home Medications    Prior to Admission medications   Medication Sig Start Date End Date Taking? Authorizing Provider  acetaminophen (TYLENOL) 500 MG tablet Take 500-1,000 mg by mouth every 6 (six) hours as needed (pain.).    [provider]  allopurinol (ZYLOPRIM) 100 MG tablet Take 100 mg by mouth 2 (two) times daily. 10/15/17   [provider]  amLODipine (NORVASC) 5 MG tablet Take 1 tablet (5 mg total) by mouth daily. 02/13/22   Baldo Daub, MD  amoxicillin (AMOXIL) 500 MG tablet Take 4 tablets (2,000 mg total) by mouth as directed. 1 hour prior to dental work including cleanings 04/05/22   Janetta Hora, PA-C  Ascorbic Acid (VITAMIN C) 500 MG CAPS Take 500 mg by mouth at bedtime.    [provider]  aspirin EC 81 MG tablet Take 1 tablet (81 mg total) by mouth daily. Patient taking differently: Take 81 mg by mouth at bedtime. 12/09/17   Baldo Daub, MD  escitalopram (LEXAPRO) 10 MG tablet Take 10 mg by mouth in the morning.    [provider]  Evolocumab (REPATHA SURECLICK) 140 MG/ML SOAJ Inject 140 mg into the skin every 14 (fourteen) days. 02/19/22   Baldo Daub, MD  finasteride (PROSCAR) 5 MG tablet Take 5 mg by mouth at bedtime.    [provider]  furosemide (LASIX) 40 MG tablet Take 20-40 mg by mouth See admin instructions. Take 1 tablet (40 mg) by mouth in the morning & take 0.5 tablet (20 mg) by mouth in the evening.    [provider]  latanoprost (XALATAN) 0.005 % ophthalmic solution Place 1 drop into both eyes at bedtime. 05/28/19   [provider]   Multiple Vitamins-Minerals (PRESERVISION AREDS 2 PO) Take 1 tablet by mouth 2 (two) times daily.    [provider]  nitroGLYCERIN (NITROSTAT) 0.4 MG SL tablet Place 1 tablet (0.4 mg total) under the tongue every 5 (five) minutes as needed for chest pain. 12/22/17 03/23/23  Baldo Daub, MD  olmesartan (BENICAR) 40 MG tablet Take 40 mg by mouth in the morning. 06/09/19   [provider]  Omega-3 Fatty Acids (FISH OIL) 1000 MG CAPS Take 1,000 mg by mouth 2 (two) times daily.    [provider]  Psyllium (METAMUCIL) 28.3 % POWD Take 6 g by mouth in the morning. 8 oz of Water (2 teaspoons)    [provider]  vitamin B-12 (CYANOCOBALAMIN) 500 MCG tablet Take 500 mcg by mouth every other day. In the morning.    [provider]    Physical Exam    Vital Signs:  Terrez Rosales does not have vital signs available for review today.  Given telephonic nature of communication, physical exam is limited. AAOx3. NAD. Normal affect.  Speech and respirations are unlabored.  Accessory Clinical Findings    None  Assessment & Plan    1.  Preoperative Cardiovascular Risk Assessment: Colovesical Fistula Repair, Dr. Star Age, [facility] surgery, [facility], [phone]      Primary Cardiologist: Norman Herrlich, MD  Chart reviewed as part of pre-operative protocol coverage. Given past medical history and time since last visit, based on ACC/AHA guidelines, Azrael Diallo would be at acceptable risk for the planned procedure without further cardiovascular testing.   Regarding ASA therapy, we recommend continuation of ASA throughout the perioperative period. However, if the surgeon feels that cessation of ASA is required in the perioperative period, it may be stopped 5-7 days prior to surgery with a plan to resume it as soon as felt to be feasible from a surgical standpoint in the post-operative period.   Patient was advised that if  he develops new symptoms prior to surgery to contact our office to arrange a follow-up appointment.  He verbalized understanding.  I will route this recommendation to the requesting party via Epic fax function and remove from pre-op pool.       Time:   Today, I have spent 5 minutes with the patient with telehealth technology discussing medical history, symptoms, and management plan.  Prior to her phone evaluation I spent greater than 10 minutes reviewing her past medical history and cardiac medications.   Ronney Asters, NP  06/17/2022, 8:30 AM

## 2022-06-25 ENCOUNTER — Other Ambulatory Visit: Payer: Self-pay | Admitting: Urology

## 2022-08-07 ENCOUNTER — Ambulatory Visit: Payer: Medicare HMO | Admitting: Gastroenterology

## 2022-08-07 ENCOUNTER — Encounter (HOSPITAL_COMMUNITY): Payer: Self-pay

## 2022-08-07 ENCOUNTER — Encounter: Payer: Self-pay | Admitting: Gastroenterology

## 2022-08-07 VITALS — BP 150/80 | HR 80 | Ht 66.0 in | Wt 190.0 lb

## 2022-08-07 DIAGNOSIS — Z8719 Personal history of other diseases of the digestive system: Secondary | ICD-10-CM | POA: Diagnosis not present

## 2022-08-07 DIAGNOSIS — N321 Vesicointestinal fistula: Secondary | ICD-10-CM | POA: Diagnosis not present

## 2022-08-07 NOTE — Progress Notes (Signed)
Chief Complaint: For colonoscopy  Referring Provider:  Karie Soda, MD      ASSESSMENT AND PLAN;   #1. H/O diverticulitis with prior abscess now with pneumaturia and workup very suspicious for colovesical fistula. Already cleared by cardiology for Sx  Plan: -Colon June 11 7;30 with miralax -Sx (sigmoid resection with fistula takedown) scheduled for June 12 (Dr Michaell Cowing)   Discussed risks & benefits of colonoscopy. Risks including rare perforation req laparotomy, bleeding after bx/polypectomy req blood transfusion, rarely missing neoplasms, risks of anesthesia/sedation, rare risk of damage to internal organs. Benefits outweigh the risks. Patient agrees to proceed. All the questions were answered. Pt consents to proceed. HPI:    Matthew Mcneil is a 82 y.o. 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 Accompanied by his wife.  Dx with colovesicular fistula with pneumaturia. Not having frequent UTIs.  Does have history of diverticular abscess previously. CTA with gas in the urinary bladder.  Seen by Dr. Michaell Cowing and is scheduled for sigmoid resection with colovesicular fistula repair on August 21, 2022. Neg urologic eval  Here for preoperative colonoscopy possibly on June 11.  He has been cleared by cardiology for surgery.  Currently denies having any GI complaints.  He denies having any nausea, vomiting, heartburn, regurgitation, odynophagia or dysphagia.  He denies having any diarrhea or constipation.  No melena or hematochezia.  No recent weight loss.  Doing well from cardiac standpoint.  Most recent labs from 05/11/2022 showed hemoglobin to be 13.4, MCV 85.  Previous GI workup: Colonoscopy3/2014: ([facility]): Mild sigmoid diverticulosis.  Otherwise normal colonoscopy.  Repeat in 10 years.  CTA AP 03/09/2022 1. Vascular findings and  measurements pertinent to potential TAVR procedure, as detailed above. 2. Severe thickening and calcification of the aortic valve, compatible with reported clinical history of severe aortic stenosis. 3. Gas present within the lumen of the urinary bladder, with mild diffuse urothelial enhancement and potential colovesical fistula, as detailed above. Previously noted diverticular abscess cavity appears partially contracted, however, the likelihood of residual fistulous tract is high given the above findings. Urologic consultation is recommended. 4. 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 5. Multiple renal lesions bilaterally, including an indeterminate high attenuation lesion extending exophytically from the anterior aspect of the lower pole of the left kidney. Follow-up nonemergent outpatient abdominal MRI with and without IV gadolinium should be considered in the near future to provide definitive characterization and exclude neoplasm. 6. 5 mm calculus at the left ureteropelvic junction. No proximal hydronephrosis to indicate urinary tract obstruction at this time. 7. Aortic atherosclerosis, in addition to three-vessel coronary artery disease. 8. 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 9. Hepatic steatosis. 10. Additional incidental findings, as above   MR Abdo with and without contrast 05/09/2022 1. Previously noted 2.3 cm exophytic anterior left lower pole renal lesion corresponds to an intrinsically T1 hyperintense hemorrhagic/proteinaceous cyst. Multiple additional bilateral Bosniak 1 cysts without definite enhancement. No specific follow-up imaging recommended. 2. Scattered hepatic cysts.    SH-retired Paediatric nurse    Past Medical History:  Diagnosis Date    Anal fissure    Anemia, chronic disease 04/04/2020   Anxiety 04/29/2016   Atherosclerosis of both carotid arteries 09/15/2018  Formatting of this note might be different from the original. Follows with [facility].   Benign prostate hyperplasia 04/28/2015   Carotid artery occlusion    Chronic idiopathic gout 04/28/2015   Chronic kidney disease, stage III (moderate) (HCC) 04/28/2015   Colon polyp 04/28/2015   Coronary artery disease involving native coronary artery of native heart with angina pectoris (HCC) 05/06/2016   Essential hypertension 04/28/2015   Glaucoma    High blood pressure    High cholesterol    History of COVID-19 05/20/2019   Formatting of this note might be different from the original. Late January 2021.   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 Follows with [facility].   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 Otherwise normal colonsocopy   CORONARY ANGIOPLASTY     HEMORRHOID SURGERY     HEMORROIDECTOMY     INTRAOPERATIVE TRANSTHORACIC ECHOCARDIOGRAM N/A 03/26/2022   Procedure: INTRAOPERATIVE TRANSTHORACIC ECHOCARDIOGRAM;  Surgeon: [name], MD;  Location: [facility] OR;  Service: Open Heart Surgery;  Laterality: N/A;   RIGHT/LEFT HEART CATH AND CORONARY ANGIOGRAPHY  N/A 02/20/2022   Procedure: RIGHT/LEFT HEART CATH AND CORONARY ANGIOGRAPHY;  Surgeon: [name], MD;  Location: [facility] INVASIVE CV LAB;  Service: Cardiovascular;  Laterality: N/A;   TRANSCATHETER AORTIC VALVE REPLACEMENT, TRANSFEMORAL Right 03/26/2022   Procedure: Transcatheter Aortic Valve Replacement, Transfemoral using Edwards 26 MM SAPIEN 3 Ultra;  Surgeon: [name], MD;  Location[facility] OR;  Service: Open Heart Surgery;  Laterality: Right;  Transfemoral    Family History  Problem Relation Age of Onset   Cancer Mother    Heart attack Mother    Hypertension Mother    Diabetes Mother    Diabetes type II Mother    Hypertension Father    Heart attack Father    Colon polyps Father    Heart attack Brother    Diabetes Brother    Diabetes type I Brother    Esophageal cancer Neg Hx    Colon cancer Neg Hx    Liver disease Neg Hx     Social History   Tobacco Use   Smoking status: Never   Smokeless tobacco: Never  Vaping Use   Vaping Use: Never used  Substance Use Topics   Alcohol use: Not Currently   Drug use: Not Currently    Current Outpatient Medications  Medication Sig Dispense Refill   acetaminophen (TYLENOL) 500 MG tablet Take 500-1,000 mg by mouth every 6 (six) hours as needed (pain.).     allopurinol (ZYLOPRIM) 100 MG tablet Take 100 mg by mouth 2 (two) times daily.     amLODipine (NORVASC) 5 MG tablet Take 1 tablet (5 mg total) by mouth daily. 90 tablet 3   amoxicillin (AMOXIL) 500 MG tablet Take 4  tablets (2,000 mg total) by mouth as directed. 1 hour prior to dental work including cleanings 12 tablet 12   Ascorbic Acid (VITAMIN C) 500 MG CAPS Take 500 mg by mouth at bedtime.     aspirin EC 81 MG tablet Take 1 tablet (81 mg total) by mouth daily. (Patient taking differently: Take 81 mg by mouth at bedtime.) 90 tablet 3   escitalopram (LEXAPRO) 10 MG tablet Take 10 mg by mouth in the morning.     Evolocumab (REPATHA SURECLICK) 140 MG/ML SOAJ Inject 140 mg into  the skin every 14 (fourteen) days. 6 mL 3   finasteride (PROSCAR) 5 MG tablet Take 5 mg by mouth at bedtime.     furosemide (LASIX) 40 MG tablet Take 20-40 mg by mouth See admin instructions. Take 1 tablet (40 mg) by mouth in the morning & take 0.5 tablet (20 mg) by mouth in the evening.     latanoprost (XALATAN) 0.005 % ophthalmic solution Place 1 drop into both eyes at bedtime.     Multiple Vitamins-Minerals (PRESERVISION AREDS 2 PO) Take 1 tablet by mouth 2 (two) times daily.     nitroGLYCERIN (NITROSTAT) 0.4 MG SL tablet Place 1 tablet (0.4 mg total) under the tongue every 5 (five) minutes as needed for chest pain. 25 tablet 11   olmesartan (BENICAR) 40 MG tablet Take 40 mg by mouth in the morning.     Omega-3 Fatty Acids (FISH OIL) 1000 MG CAPS Take 1,000 mg by mouth 2 (two) times daily.     Psyllium (METAMUCIL) 28.3 % POWD Take 6 g by mouth in the morning. 8 oz of Water (2 teaspoons)     vitamin B-12 (CYANOCOBALAMIN) 500 MCG tablet Take 500 mcg by mouth every other day. In the morning.     No current facility-administered medications for this visit.    Allergies  Allergen Reactions   Aldactone [Spironolactone] Other (See Comments)    Patient was in a panic   Statins Other (See Comments)    Myalgia     Review of Systems:  Constitutional: Denies fever, chills, diaphoresis, appetite change and has fatigue.  HEENT: Has hearing problems.   Respiratory: Denies SOB, DOE, cough, chest tightness,  and wheezing.   Cardiovascular: Denies chest pain, palpitations and leg swelling.  Genitourinary: Denies dysuria, urgency, frequency, hematuria, flank pain and difficulty urinating.  Musculoskeletal: Denies myalgias, back pain, joint swelling, arthralgias and gait problem.  Skin: No rash.  Neurological: Denies dizziness, seizures, syncope, weakness, light-headedness, numbness and headaches.  Hematological: Denies adenopathy. Easy bruising, personal or family bleeding history   Psychiatric/Behavioral: Has anxiety or depression     Physical Exam:    BP (!) 150/80   Pulse 80   Ht 5\' 6"  (1.676 m)   Wt 190 lb (86.2 kg)   BMI 30.67 kg/m  Wt Readings from Last 3 Encounters:  08/07/22 190 lb (86.2 kg)  04/29/22 188 lb (85.3 kg)  04/05/22 191 lb 9.6 oz (86.9 kg)   Constitutional:  Well-developed, in no acute distress. Psychiatric: Normal mood and affect. Behavior is normal. HEENT: Pupils normal.  Conjunctivae are normal. No scleral icterus. Cardiovascular: Normal rate, regular rhythm. No edema Pulmonary/chest: Effort normal and breath sounds normal. No wheezing, rales or rhonchi. Abdominal: Soft, nondistended. Nontender. Bowel sounds active throughout. There are no masses palpable. No hepatomegaly. Rectal: Deferred-to be performed at the time of colonoscopy Neurological: Alert and oriented to person place and time. Skin: Skin is warm and dry. No rashes noted.  Multiple bruises.  Data Reviewed: I have personally reviewed following labs and imaging studies  CBC:    Latest Ref Rng & Units 03/27/2022    1:46 AM 03/26/2022    1:45 PM 03/26/2022   12:11 PM  CBC  WBC 4.0 - 10.5 K/uL 10.1     Hemoglobin 13.0 - 17.0 g/dL 8.9  16.1  09.6   Hematocrit 39.0 - 52.0 % 28.7  31.0  35.0   Platelets 150 - 400 K/uL 555       CMP:    Latest Ref Rng & Units 04/05/2022   10:22 AM 03/27/2022    9:29 AM 03/27/2022    1:46 AM  CMP  Glucose 70 - 99 mg/dL 95  045  409   BUN 8 - 27 mg/dL 27  32  23   Creatinine 0.76 - 1.27 mg/dL 8.11  9.14  7.82   Sodium 134 - 144 mmol/L 143  142  136   Potassium 3.5 - 5.2 mmol/L 4.5  4.2  4.3   Chloride 96 - 106 mmol/L 102  105  100   CO2 20 - 29 mmol/L 26  26  27    Calcium 8.6 - 10.2 mg/dL 95.6  21.3  9.1        Edman Circle, MD 08/07/2022, 8:47 AM  Cc: Karie Soda, MD

## 2022-08-07 NOTE — Patient Instructions (Signed)
_______________________________________________________  If your blood pressure at your visit was 140/90 or greater, please contact your primary care physician to follow up on this.  _______________________________________________________  If you are age 83 or older, your body mass index should be between 23-30. Your Body mass index is 30.67 kg/m. If this is out of the aforementioned range listed, please consider follow up with your Primary Care Provider.  If you are age 83 or younger, your body mass index should be between 19-25. Your Body mass index is 30.67 kg/m. If this is out of the aformentioned range listed, please consider follow up with your Primary Care Provider.   ________________________________________________________  The [facility] providers would like to encourage you to use [facility] to communicate with providers for non-urgent requests or questions.  Due to long hold times on the telephone, sending your provider a message by [facility] may be a faster and more efficient way to get a response.  Please allow 48 business hours for a response.  Please remember that this is for non-urgent requests.  _______________________________________________________  Bonita Quin have been scheduled for a colonoscopy. Please follow written instructions given to you at your visit today.  Please pick up your prep supplies at the pharmacy within the next 1-3 days. If you use inhalers (even only as needed), please bring them with you on the day of your procedure.  Two days before your procedure: Mix 3 packs (or capfuls) of Miralax in 48 ounces of clear liquid and drink at 6pm.  Thank you,  Dr. Lynann Bologna

## 2022-08-09 ENCOUNTER — Ambulatory Visit: Payer: Medicare HMO | Admitting: Physician Assistant

## 2022-08-12 NOTE — Progress Notes (Signed)
COVID Vaccine received:  [x]  No []  Yes Date of any COVID positive Test in last 62 days:No  PCP - Feliciana Rossetti MD Cardiologist - Norman Herrlich MD  Chest x-ray - 03/22/22  EPIC EKG -  03/16/22 EPIC Stress Test - Greater than 5 years ECHO - 04/29/22 Epic Cardiac Cath - 03/2022 TAVR  Jan. 2024  EPIC  Cardiac clearance 06/17/22- Edd Fabian NP EPIC  Bowel Prep - []  No  [x]   Yes __Pt's wife provided papers from Dr. Michaell Cowing' office with instructions of bowel prep.  Pacemaker / ICD device [x]  No []  Yes   Spinal Cord Stimulator:[x]  No []  Yes       History of Sleep Apnea? [x]  No []  Yes   CPAP used?- [x]  No []  Yes    Does the patient monitor blood sugar?          [x]  No []  Yes  []  N/A  Patient has: []  NO Hx DM   [x]  Pre-DM                 []  DM1  []   DM2 Does patient have a [employer] or Dexacom? [x]  No []  Yes   Fasting Blood Sugar Ranges-  Checks Blood Sugar __0___ times a day  GLP1 agonist / usual dose -  GLP1 instructions: No SGLT-2 inhibitors / usual dose -  SGLT-2 instructions: No Blood Thinner / Instructions: Aspirin Instructions:81mg  ASA. MD said may continue to take ASA.  Comments:   Activity level: Patient is able  to climb a flight of stairs without difficulty; [x]  No CP  [x]  No SOB,    Patient can perform ADLs without assistance.   Anesthesia review: Aortic valve stenosis, TAVR 03/26/22, HTN, hx. Stroke, Athersclerosis both carotids  Patient denies shortness of breath, fever, cough and chest pain at PAT appointment.  Patient verbalized understanding and agreement to the Pre-Surgical Instructions that were given to them at this PAT appointment. Patient was also educated of the need to review these PAT instructions again prior to his/her surgery.I reviewed the appropriate phone numbers to call if they have any and questions or concerns.

## 2022-08-12 NOTE — Patient Instructions (Addendum)
SURGICAL WAITING ROOM VISITATION  Patients having surgery or a procedure may have no more than 2 support people in the waiting area - these visitors may rotate.    Children under the age of 85 must have an adult with them who is not the patient.  Due to an increase in RSV and influenza rates and associated hospitalizations, children ages 51 and under may not visit patients in [facility] hospitals.  If the patient needs to stay at the hospital during part of their recovery, the visitor guidelines for inpatient rooms apply. Pre-op nurse will coordinate an appropriate time for 1 support person to accompany patient in pre-op.  This support person may not rotate.    Please refer to the [facility] website for the visitor guidelines for Inpatients (after your surgery is over and you are in a regular room).       Your procedure is scheduled on: 08/21/2022   Report to [facility] Main Entrance    Report to admitting at  6:15 AM   Call this number if you have problems the morning of surgery [phone]   Do not eat food :After Midnight. Drink plenty of clear liquids the day before surgery when doing bowel prep   After Midnight you may have the following liquids until 5:30 AM DAY OF SURGERY  Water Non-Citrus Juices (without pulp, NO RED-Apple, White grape, White cranberry) Black Coffee (NO MILK/CREAM OR CREAMERS, sugar ok)  Clear Tea (NO MILK/CREAM OR CREAMERS, sugar ok) regular and decaf                             Plain Jell-O (NO RED)                                           Fruit ices (not with fruit pulp, NO RED)                                     Popsicles (NO RED)                                                               Sports drinks like Gatorade (NO RED)              Drink 2 G2 drinks AT 10:00 PM the night before surgery.        The day of surgery:  Drink ONE (1) Pre-Surgery G2 at 5:30AM the morning of surgery. Drink in one sitting. Do not sip.  This drink was  given to you during your hospital  pre-op appointment visit. Nothing else to drink after completing the  Pre-Surgery  G2.          If you have questions, please contact your surgeon's office.   FOLLOW BOWEL PREP AND ANY ADDITIONAL PRE OP INSTRUCTIONS YOU RECEIVED FROM YOUR SURGEON'S OFFICE!!!     Oral Hygiene is also important to reduce your risk of infection.  Remember - BRUSH YOUR TEETH THE MORNING OF SURGERY WITH YOUR REGULAR TOOTHPASTE  DENTURES WILL BE REMOVED PRIOR TO SURGERY PLEASE DO NOT APPLY "Poly grip" OR ADHESIVES!!!   Do NOT smoke after Midnight   Take these medicines the morning of surgery with A SIP OF WATER: Tylenol, Allopurinol, Amlodipine, Escitaprolam  Bring CPAP mask and tubing day of surgery.                              You may not have any metal on your body including hair pins, jewelry, and body piercing             Do not wear make-up, lotions, powders, cologne, or deodorant                Men may shave face and neck.   Do not bring valuables to the hospital. [facility] IS NOT             RESPONSIBLE   FOR VALUABLES.   Contacts, glasses, dentures or bridgework may not be worn into surgery.   Bring small overnight bag day of surgery.   DO NOT BRING YOUR HOME MEDICATIONS TO THE HOSPITAL. PHARMACY WILL DISPENSE MEDICATIONS LISTED ON YOUR MEDICATION LIST TO YOU DURING YOUR ADMISSION IN THE HOSPITAL!    Patients discharged on the day of surgery will not be allowed to drive home.  Someone NEEDS to stay with you for the first 24 hours after anesthesia.   Special Instructions: Bring a copy of your healthcare power of attorney and living will documents the day of surgery if you haven't scanned them before.              Please read over the following fact sheets you were given: IF YOU HAVE QUESTIONS ABOUT YOUR PRE-OP INSTRUCTIONS PLEASE CALL [phone]   If you received a COVID test during your pre-op visit  it is  requested that you wear a mask when out in public, stay away from anyone that may not be feeling well and notify your surgeon if you develop symptoms. If you test positive for Covid or have been in contact with anyone that has tested positive in the last 10 days please notify you surgeon.                          [place] - Preparing for Surgery Before surgery, you can play an important role.  Because skin is not sterile, your skin needs to be as free of germs as possible.  You can reduce the number of germs on your skin by washing with CHG (chlorahexidine gluconate) soap before surgery.  CHG is an antiseptic cleaner which kills germs and bonds with the skin to continue killing germs even after washing. Please DO NOT use if you have an allergy to CHG or antibacterial soaps.  If your skin becomes reddened/irritated stop using the CHG and inform your nurse when you arrive at Short Stay. Do not shave (including legs and underarms) for at least 48 hours prior to the first CHG shower.  You may shave your face/neck.  Please follow these instructions carefully:  1.  Shower with CHG Soap the night before surgery and the  morning of surgery.  2.  If you choose to wash your hair, wash your hair first as usual with your normal  shampoo.  3.  After you shampoo, rinse your hair  and body thoroughly to remove the shampoo.                             4.  Use CHG as you would any other liquid soap.  You can apply chg directly to the skin and wash.  Gently with a scrungie or clean washcloth.  5.  Apply the CHG Soap to your body ONLY FROM THE NECK DOWN.   Do   not use on face/ open                           Wound or open sores. Avoid contact with eyes, ears mouth and   genitals (private parts).                       Wash face,  Genitals (private parts) with your normal soap.             6.  Wash thoroughly, paying special attention to the area where your    surgery  will be performed.  7.  Thoroughly rinse your body  with warm water from the neck down.  8.  DO NOT shower/wash with your normal soap after using and rinsing off the CHG Soap.                9.  Pat yourself dry with a clean towel.            10.  Wear clean pajamas.            11.  Place clean sheets on your bed the night of your first shower and do not  sleep with pets. Day of Surgery : Do not apply any lotions/deodorants the morning of surgery.  Please wear clean clothes to the hospital/surgery center.  FAILURE TO FOLLOW THESE INSTRUCTIONS MAY RESULT IN THE CANCELLATION OF YOUR SURGERY  PATIENT SIGNATURE_________________________________  NURSE SIGNATURE__________________________________  ________________________________________________________________________

## 2022-08-13 ENCOUNTER — Other Ambulatory Visit: Payer: Self-pay

## 2022-08-13 ENCOUNTER — Encounter (HOSPITAL_COMMUNITY)
Admission: RE | Admit: 2022-08-13 | Discharge: 2022-08-13 | Disposition: A | Discharge status: Home / Self Care | Payer: Medicare HMO | Source: Ambulatory Visit | Attending: Surgery | Admitting: Surgery

## 2022-08-13 ENCOUNTER — Encounter (HOSPITAL_COMMUNITY): Payer: Self-pay

## 2022-08-13 VITALS — BP 151/67 | HR 68 | Temp 98.0°F | Resp 16 | Ht 66.0 in | Wt 191.0 lb

## 2022-08-13 DIAGNOSIS — Z01812 Encounter for preprocedural laboratory examination: Secondary | ICD-10-CM | POA: Insufficient documentation

## 2022-08-13 DIAGNOSIS — I2510 Atherosclerotic heart disease of native coronary artery without angina pectoris: Secondary | ICD-10-CM | POA: Insufficient documentation

## 2022-08-13 DIAGNOSIS — N321 Vesicointestinal fistula: Secondary | ICD-10-CM | POA: Insufficient documentation

## 2022-08-13 DIAGNOSIS — Z952 Presence of prosthetic heart valve: Secondary | ICD-10-CM | POA: Insufficient documentation

## 2022-08-13 DIAGNOSIS — N183 Chronic kidney disease, stage 3 unspecified: Secondary | ICD-10-CM | POA: Diagnosis not present

## 2022-08-13 DIAGNOSIS — N400 Enlarged prostate without lower urinary tract symptoms: Secondary | ICD-10-CM | POA: Diagnosis not present

## 2022-08-13 DIAGNOSIS — I129 Hypertensive chronic kidney disease with stage 1 through stage 4 chronic kidney disease, or unspecified chronic kidney disease: Secondary | ICD-10-CM | POA: Insufficient documentation

## 2022-08-13 DIAGNOSIS — I10 Essential (primary) hypertension: Secondary | ICD-10-CM

## 2022-08-13 DIAGNOSIS — R7303 Prediabetes: Secondary | ICD-10-CM

## 2022-08-13 DIAGNOSIS — Z01818 Encounter for other preprocedural examination: Secondary | ICD-10-CM

## 2022-08-13 HISTORY — DX: Acute myocardial infarction, unspecified: I21.9

## 2022-08-13 HISTORY — DX: Prediabetes: R73.03

## 2022-08-13 HISTORY — DX: Personal history of urinary calculi: Z87.442

## 2022-08-13 LAB — BASIC METABOLIC PANEL
Anion gap: 10 (ref 5–15)
BUN: 47 mg/dL — ABNORMAL HIGH (ref 8–23)
CO2: 24 mmol/L (ref 22–32)
Calcium: 10.4 mg/dL — ABNORMAL HIGH (ref 8.9–10.3)
Chloride: 112 mmol/L — ABNORMAL HIGH (ref 98–111)
Creatinine, Ser: 1.5 mg/dL — ABNORMAL HIGH (ref 0.61–1.24)
GFR, Estimated: 46 mL/min — ABNORMAL LOW (ref >60)
Glucose, Bld: 95 mg/dL (ref 70–99)
Potassium: 4.3 mmol/L (ref 3.5–5.1)
Sodium: 146 mmol/L — ABNORMAL HIGH (ref 135–145)

## 2022-08-13 LAB — CBC
HCT: 42.1 % (ref 39.0–52.0)
Hemoglobin: 12.9 g/dL — ABNORMAL LOW (ref 13.0–17.0)
MCH: 28.9 pg (ref 26.0–34.0)
MCHC: 30.6 g/dL (ref 30.0–36.0)
MCV: 94.4 fL (ref 80.0–100.0)
Platelets: 140 10*3/uL — ABNORMAL LOW (ref 150–400)
RBC: 4.46 MIL/uL (ref 4.22–5.81)
RDW: 15.8 % — ABNORMAL HIGH (ref 11.5–15.5)
WBC: 9.6 10*3/uL (ref 4.0–10.5)
nRBC: 0 % (ref 0.0–0.2)

## 2022-08-13 LAB — GLUCOSE, CAPILLARY: Glucose-Capillary: 98 mg/dL (ref 70–99)

## 2022-08-13 NOTE — Consult Note (Signed)
WOC Nurse ostomy consult note  WOC Nurse requested for preoperative stoma site marking by Dr. Michaell Cowing.    Discussed surgical procedure and stoma creation with patient and wife.  Explained role of the WOC nurse team. They understand the rationale for a marking. Both understand that if a bowel diversion were to be necessary that it would be likely to be temporary. Answered patient and wife's questions.   Examined patient sitting and standing in order to place the marking in the patient's visual field, away from any creases or abdominal contour issues and within the rectus muscle.    Marked for colostomy in the LUQ  7cm to the left of the umbilicus and 1.5cm above the umbilicus.  Marked for ileostomy in the RLQ  6.5cm to the right of the umbilicus and  1cm below the umbilicus.  Patient's abdomen cleansed with CHG wipes at site markings, allowed to air dry prior to marking.Covered marks with thin film transparent dressings to preserve until date of surgery, 08/21/22.   WOC Nurse team will follow up with patient after surgery for continued ostomy care and teaching if an ostomy is created intraoperatively.   Thank you for inviting [place] to participate in this patient's Plan of Care.  Ladona Mow, MSN, RN, CNS, GNP, Leda Min, [employer], [employer] phone:  [phone]

## 2022-08-14 LAB — HEMOGLOBIN A1C
Hgb A1c MFr Bld: 5.7 % — ABNORMAL HIGH (ref 4.8–5.6)
Mean Plasma Glucose: 117 mg/dL

## 2022-08-15 NOTE — Anesthesia Preprocedure Evaluation (Deleted)
Anesthesia Evaluation    Airway        Dental   Pulmonary           Cardiovascular hypertension,      Neuro/Psych    GI/Hepatic   Endo/Other    Renal/GU      Musculoskeletal   Abdominal   Peds  Hematology   Anesthesia Other Findings   Reproductive/Obstetrics                             Anesthesia Physical Anesthesia Plan  ASA:   Anesthesia Plan:    Post-op Pain Management:    Induction:   PONV Risk Score and Plan:   Airway Management Planned:   Additional Equipment:   Intra-op Plan:   Post-operative Plan:   Informed Consent:   Plan Discussed with:   Anesthesia Plan Comments: (See PAT note 08/13/2022)       Anesthesia Quick Evaluation

## 2022-08-15 NOTE — Progress Notes (Signed)
Anesthesia Chart Review   Case: [id number] Date/Time: 08/21/22 0815   Procedures:      ROBOTIC RESECTION OF COLON- RECTOSIGMOID, POSSIBLE BLADDER REPAIR AND POSSIBLE OSTOMY     RIGID PROCTOSCOPY     CYSTOSCOPY with FIREFLY INJECTION   Anesthesia type: General   Pre-op diagnosis: COLOVESICAL FISTULA   Location: [facility] ROOM 02 / [facility]   Surgeons: Karie Soda, MD; Despina Arias, MD       DISCUSSION:82 y.o. 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 Karie Soda and Dr. Traci Sermon.   Per cardiology preoperative evaluation 06/17/2022, "Chart reviewed as part of pre-operative protocol coverage. Given past medical history and time since last visit, based on ACC/AHA guidelines, Matthew Mcneil would be at acceptable risk for the planned procedure without further cardiovascular testing.    Regarding ASA therapy, we recommend continuation of ASA throughout the perioperative period. However, if the surgeon feels that cessation of ASA is required in the perioperative period, it may be stopped 5-7 days prior to surgery with a plan to resume it as soon as felt to be feasible from a surgical standpoint in the post-operative period."  Anticipate pt can proceed with planned procedure barring acute status change.   VS: BP (!) 151/67   Pulse 68   Temp 36.7 C (Oral)   Resp 16   Ht 5\' 6"  (1.676 m)   Wt 86.6 kg   SpO2 98%   BMI 30.83 kg/m   PROVIDERS: Gordan Payment., MD is PCP   Cardiologist - Norman Herrlich MD  LABS: Labs reviewed: Acceptable for surgery. (all labs ordered are listed, but only abnormal results are displayed)  Labs Reviewed  HEMOGLOBIN A1C - Abnormal; Notable for the following components:      Result Value   Hgb A1c MFr Bld 5.7 (*)    All other components within normal limits  BASIC METABOLIC PANEL - Abnormal; Notable for the following components:   Sodium 146 (*)     Chloride 112 (*)    BUN 47 (*)    Creatinine, Ser 1.50 (*)    Calcium 10.4 (*)    GFR, Estimated 46 (*)    All other components within normal limits  CBC - Abnormal; Notable for the following components:   Hemoglobin 12.9 (*)    RDW 15.8 (*)    Platelets 140 (*)    All other components within normal limits  GLUCOSE, CAPILLARY  TYPE AND SCREEN     IMAGES:   EKG:   CV: Echo 04/29/2022 1. Left ventricular ejection fraction, by estimation, is 50 to 55%. The  left ventricle has low normal function. The left ventricle has no regional  wall motion abnormalities. Left ventricular diastolic parameters are  indeterminate.   2. Right ventricular systolic function is normal. The right ventricular  size is normal. There is normal pulmonary artery systolic pressure.   3. Left atrial size was mildly dilated.   4. Right atrial size was mildly dilated.   5. Mild mitral valve regurgitation. Moderate mitral annular  calcification.   6. S/p TAVR ( 26 mm Edwards S3UR, procedure date 03/26/22) Keak and mean  gradients through the valve are 28 and 15 mm Hg AVA (VTI) is 1.75 cm2.  Dimensionless index is 0.42 Compared to echo from 03/27/22 there is a  slight increase in mean gradient (11 to  15 mm Hg ). . The aortic valve  has been repaired/replaced. Aortic valve  regurgitation is not visualized.   7. The inferior vena cava is normal in size with greater than 50%  respiratory variability, suggesting right atrial pressure of 3 mmHg.   Past Medical History:  Diagnosis Date   Anal fissure    Anemia, chronic disease 04/04/2020   Anxiety 04/29/2016   Atherosclerosis of both carotid arteries 09/15/2018   Formatting of this note might be different from the original. Follows with [facility].   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 Late January 2021.   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 Follows with [facility].   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 Otherwise normal colonsocopy   CORONARY ANGIOPLASTY     HEMORRHOID SURGERY     HEMORROIDECTOMY     INTRAOPERATIVE TRANSTHORACIC ECHOCARDIOGRAM N/A 03/26/2022   Procedure: INTRAOPERATIVE TRANSTHORACIC ECHOCARDIOGRAM;  Surgeon: [name], MD;  Location: [facility] OR;  Service: Open Heart Surgery;  Laterality: N/A;   RIGHT/LEFT HEART CATH AND CORONARY ANGIOGRAPHY N/A 02/20/2022   Procedure: RIGHT/LEFT HEART CATH AND CORONARY ANGIOGRAPHY;  Surgeon: [name], MD;  Location: [facility] INVASIVE CV LAB;  Service: Cardiovascular;  Laterality: N/A;   TONSILLECTOMY     TRANSCATHETER AORTIC VALVE REPLACEMENT, TRANSFEMORAL Right 03/26/2022   Procedure: Transcatheter Aortic Valve Replacement, Transfemoral using Edwards 26 MM SAPIEN 3 Ultra;  Surgeon: [name], MD;  Location: [facility]  OR;  Service: Open Heart Surgery;  Laterality: Right;  Transfemoral    MEDICATIONS:  acetaminophen (TYLENOL) 500 MG tablet   allopurinol (ZYLOPRIM) 100 MG tablet   amLODipine (NORVASC) 5 MG tablet   amoxicillin (AMOXIL) 500 MG tablet   Ascorbic Acid (VITAMIN C) 500 MG CAPS   aspirin EC 81 MG tablet   escitalopram (LEXAPRO) 10 MG tablet   Evolocumab (REPATHA SURECLICK) 140 MG/ML SOAJ   finasteride (PROSCAR) 5 MG tablet   furosemide (LASIX) 40 MG tablet   latanoprost (XALATAN) 0.005 % ophthalmic solution   Multiple Vitamins-Minerals (PRESERVISION AREDS 2 PO)   nitroGLYCERIN (NITROSTAT) 0.4 MG SL tablet   olmesartan (BENICAR) 40 MG tablet   Omega-3 Fatty Acids (FISH OIL) 1000 MG CAPS   Psyllium (METAMUCIL) 28.3 % POWD   vitamin B-12 (CYANOCOBALAMIN) 500 MCG tablet   No current facility-administered  medications for this encounter.     Jodell Cipro Ward, PA-C [facility] Pre-Surgical Testing [phone]

## 2022-08-20 ENCOUNTER — Encounter: Payer: Self-pay | Admitting: Gastroenterology

## 2022-08-20 ENCOUNTER — Ambulatory Visit (AMBULATORY_SURGERY_CENTER): Payer: Medicare HMO | Admitting: Gastroenterology

## 2022-08-20 VITALS — BP 121/48 | HR 74 | Temp 98.4°F | Resp 14 | Ht 66.0 in | Wt 190.0 lb

## 2022-08-20 DIAGNOSIS — D122 Benign neoplasm of ascending colon: Secondary | ICD-10-CM

## 2022-08-20 DIAGNOSIS — D124 Benign neoplasm of descending colon: Secondary | ICD-10-CM

## 2022-08-20 DIAGNOSIS — Z09 Encounter for follow-up examination after completed treatment for conditions other than malignant neoplasm: Secondary | ICD-10-CM | POA: Diagnosis not present

## 2022-08-20 DIAGNOSIS — Z86010 Personal history of colonic polyps: Secondary | ICD-10-CM

## 2022-08-20 DIAGNOSIS — K635 Polyp of colon: Secondary | ICD-10-CM

## 2022-08-20 DIAGNOSIS — N321 Vesicointestinal fistula: Secondary | ICD-10-CM

## 2022-08-20 MED ORDER — SODIUM CHLORIDE 0.9 % IV SOLN
500.0000 mL | Freq: Once | INTRAVENOUS | Status: DC
Start: 2022-08-20 — End: 2022-08-20

## 2022-08-20 NOTE — Progress Notes (Signed)
Chief Complaint: For colonoscopy  Referring Provider:  Gordan Payment., MD      ASSESSMENT AND PLAN;   #1. H/O diverticulitis with prior abscess now with pneumaturia and workup very suspicious for colovesical fistula. Already cleared by cardiology for Sx  Plan: -Colon June 11 7;30 with miralax -Sx (sigmoid resection with fistula takedown) scheduled for June 12 (Dr Michaell Cowing)   Discussed risks & benefits of colonoscopy. Risks including rare perforation req laparotomy, bleeding after bx/polypectomy req blood transfusion, rarely missing neoplasms, risks of anesthesia/sedation, rare risk of damage to internal organs. Benefits outweigh the risks. Patient agrees to proceed. All the questions were answered. Pt consents to proceed. HPI:    Matthew Mcneil is a 82 y.o. 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 Accompanied by his wife.  Dx with colovesicular fistula with pneumaturia. Not having frequent UTIs.  Does have history of diverticular abscess previously. CTA with gas in the urinary bladder.  Seen by Dr. Michaell Cowing and is scheduled for sigmoid resection with colovesicular fistula repair on August 21, 2022. Neg urologic eval  Here for preoperative colonoscopy possibly on June 11.  He has been cleared by cardiology for surgery.  Currently denies having any GI complaints.  He denies having any nausea, vomiting, heartburn, regurgitation, odynophagia or dysphagia.  He denies having any diarrhea or constipation.  No melena or hematochezia.  No recent weight loss.  Doing well from cardiac standpoint.  Most recent labs from 05/11/2022 showed hemoglobin to be 13.4, MCV 85.  Previous GI workup: Colonoscopy3/2014: ([facility]): Mild sigmoid diverticulosis.  Otherwise normal colonoscopy.  Repeat in 10 years.  CTA AP 03/09/2022 1. Vascular findings and  measurements pertinent to potential TAVR procedure, as detailed above. 2. Severe thickening and calcification of the aortic valve, compatible with reported clinical history of severe aortic stenosis. 3. Gas present within the lumen of the urinary bladder, with mild diffuse urothelial enhancement and potential colovesical fistula, as detailed above. Previously noted diverticular abscess cavity appears partially contracted, however, the likelihood of residual fistulous tract is high given the above findings. Urologic consultation is recommended. 4. 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 5. Multiple renal lesions bilaterally, including an indeterminate high attenuation lesion extending exophytically from the anterior aspect of the lower pole of the left kidney. Follow-up nonemergent outpatient abdominal MRI with and without IV gadolinium should be considered in the near future to provide definitive characterization and exclude neoplasm. 6. 5 mm calculus at the left ureteropelvic junction. No proximal hydronephrosis to indicate urinary tract obstruction at this time. 7. Aortic atherosclerosis, in addition to three-vessel coronary artery disease. 8. 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 9. Hepatic steatosis. 10. Additional incidental findings, as above   MR Abdo with and without contrast 05/09/2022 1. Previously noted 2.3 cm exophytic anterior left lower pole renal lesion corresponds to an intrinsically T1 hyperintense hemorrhagic/proteinaceous cyst. Multiple additional bilateral Bosniak 1 cysts without definite enhancement. No specific follow-up imaging recommended. 2. Scattered hepatic cysts.    SH-retired Paediatric nurse    Past Medical History:  Diagnosis Date    Anal fissure    Anemia, chronic disease 04/04/2020   Anxiety 04/29/2016   Atherosclerosis of both carotid arteries 09/15/2018  Formatting of this note might be different from the original. Follows with [facility].   Benign prostate hyperplasia 04/28/2015   Carotid artery occlusion    Chronic idiopathic gout 04/28/2015   Chronic kidney disease, stage III (moderate) (HCC) 04/28/2015   Colon polyp 04/28/2015   Coronary artery disease involving native coronary artery of native heart with angina pectoris (HCC) 05/06/2016   Essential hypertension 04/28/2015   Glaucoma    High blood pressure    High cholesterol    History of COVID-19 05/20/2019   Formatting of this note might be different from the original. Late January 2021.   History of kidney stones    Hyperlipidemia 04/28/2015   Hyperparathyroidism (HCC) 04/28/2015   Kidney stone    Myocardial infarction [facility])    Nonrheumatic aortic valve stenosis 09/15/2018   Formatting of this note might be different from the original. Follows with [facility].   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 Otherwise normal colonsocopy   CORONARY ANGIOPLASTY     HEMORRHOID SURGERY     HEMORROIDECTOMY     INTRAOPERATIVE TRANSTHORACIC ECHOCARDIOGRAM N/A 03/26/2022   Procedure: INTRAOPERATIVE TRANSTHORACIC ECHOCARDIOGRAM;  Surgeon: [name], MD;  Location: [facility] OR;  Service: Open Heart Surgery;  Laterality: N/A;   RIGHT/LEFT HEART CATH AND CORONARY  ANGIOGRAPHY N/A 02/20/2022   Procedure: RIGHT/LEFT HEART CATH AND CORONARY ANGIOGRAPHY;  Surgeon: [name], MD;  Location: [facility] INVASIVE CV LAB;  Service: Cardiovascular;  Laterality: N/A;   TONSILLECTOMY     TRANSCATHETER AORTIC VALVE REPLACEMENT, TRANSFEMORAL Right 03/26/2022   Procedure: Transcatheter Aortic Valve Replacement, Transfemoral using Edwards 26 MM SAPIEN 3 Ultra;  Surgeon: [name], MD;  Location: [facility] OR;  Service: Open Heart Surgery;  Laterality: Right;  Transfemoral    Family History  Problem Relation Age of Onset   Cancer Mother    Heart attack Mother    Hypertension Mother    Diabetes Mother    Diabetes type II Mother    Hypertension Father    Heart attack Father    Colon polyps Father    Heart attack Brother    Diabetes Brother    Diabetes type I Brother    Esophageal cancer Neg Hx    Colon cancer Neg Hx    Liver disease Neg Hx     Social History   Tobacco Use   Smoking status: Never   Smokeless tobacco: Never  Vaping Use   Vaping Use: Never used  Substance Use Topics   Alcohol use: Not Currently   Drug use: Not Currently    Current Outpatient Medications  Medication Sig Dispense Refill   allopurinol (ZYLOPRIM) 100 MG tablet Take 100 mg by mouth 2 (two) times daily.     amLODipine (NORVASC) 5 MG tablet Take 1 tablet (5 mg total) by mouth daily. 90 tablet 3   Ascorbic Acid (VITAMIN C) 500 MG CAPS Take 500 mg by mouth at bedtime.  aspirin EC 81 MG tablet Take 1 tablet (81 mg total) by mouth daily. (Patient taking differently: Take 81 mg by mouth at bedtime.) 90 tablet 3   escitalopram (LEXAPRO) 10 MG tablet Take 10 mg by mouth in the morning.     finasteride (PROSCAR) 5 MG tablet Take 5 mg by mouth at bedtime.     furosemide (LASIX) 40 MG tablet Take 20-40 mg by mouth See admin instructions. Take 40 mg by mouth in the morning & take 20 mg by mouth in the evening.     latanoprost (XALATAN) 0.005 % ophthalmic solution Place 1 drop into  both eyes at bedtime.     Multiple Vitamins-Minerals (PRESERVISION AREDS 2 PO) Take 1 tablet by mouth 2 (two) times daily.     olmesartan (BENICAR) 40 MG tablet Take 40 mg by mouth in the morning.     Omega-3 Fatty Acids (FISH OIL) 1000 MG CAPS Take 1,000 mg by mouth 2 (two) times daily.     vitamin B-12 (CYANOCOBALAMIN) 500 MCG tablet Take 500 mcg by mouth every other day.     acetaminophen (TYLENOL) 500 MG tablet Take 500-1,000 mg by mouth every 6 (six) hours as needed for moderate pain.     amoxicillin (AMOXIL) 500 MG tablet Take 4 tablets (2,000 mg total) by mouth as directed. 1 hour prior to dental work including cleanings 12 tablet 12   Evolocumab (REPATHA SURECLICK) 140 MG/ML SOAJ Inject 140 mg into the skin every 14 (fourteen) days. 6 mL 3   nitroGLYCERIN (NITROSTAT) 0.4 MG SL tablet Place 1 tablet (0.4 mg total) under the tongue every 5 (five) minutes as needed for chest pain. 25 tablet 11   Psyllium (METAMUCIL) 28.3 % POWD Take 6 g by mouth in the morning. 8 oz of Water (2 teaspoons)     Current Facility-Administered Medications  Medication Dose Route Frequency Provider Last Rate Last Admin   0.9 %  sodium chloride infusion  500 mL Intravenous Once Lynann Bologna, MD        Allergies  Allergen Reactions   Aldactone [Spironolactone] Other (See Comments)    Patient was in a panic   Statins Other (See Comments)    Myalgia     Review of Systems:  Constitutional: Denies fever, chills, diaphoresis, appetite change and has fatigue.  HEENT: Has hearing problems.   Respiratory: Denies SOB, DOE, cough, chest tightness,  and wheezing.   Cardiovascular: Denies chest pain, palpitations and leg swelling.  Genitourinary: Denies dysuria, urgency, frequency, hematuria, flank pain and difficulty urinating.  Musculoskeletal: Denies myalgias, back pain, joint swelling, arthralgias and gait problem.  Skin: No rash.  Neurological: Denies dizziness, seizures, syncope, weakness, light-headedness,  numbness and headaches.  Hematological: Denies adenopathy. Easy bruising, personal or family bleeding history  Psychiatric/Behavioral: Has anxiety or depression     Physical Exam:    BP 137/74   Pulse 68   Temp 98.4 F (36.9 C)   Ht 5\' 6"  (1.676 m)   Wt 190 lb (86.2 kg)   SpO2 98%   BMI 30.67 kg/m  Wt Readings from Last 3 Encounters:  08/20/22 190 lb (86.2 kg)  08/13/22 191 lb (86.6 kg)  08/07/22 190 lb (86.2 kg)   Constitutional:  Well-developed, in no acute distress. Psychiatric: Normal mood and affect. Behavior is normal. HEENT: Pupils normal.  Conjunctivae are normal. No scleral icterus. Cardiovascular: Normal rate, regular rhythm. No edema Pulmonary/chest: Effort normal and breath sounds normal. No wheezing, rales or rhonchi. Abdominal: Soft, nondistended.  Nontender. Bowel sounds active throughout. There are no masses palpable. No hepatomegaly. Rectal: Deferred-to be performed at the time of colonoscopy Neurological: Alert and oriented to person place and time. Skin: Skin is warm and dry. No rashes noted.  Multiple bruises.  Data Reviewed: I have personally reviewed following labs and imaging studies  CBC:    Latest Ref Rng & Units 08/13/2022    9:41 AM 03/27/2022    1:46 AM 03/26/2022    1:45 PM  CBC  WBC 4.0 - 10.5 K/uL 9.6  10.1    Hemoglobin 13.0 - 17.0 g/dL 24.4  8.9  01.0   Hematocrit 39.0 - 52.0 % 42.1  28.7  31.0   Platelets 150 - 400 K/uL 140  555      CMP:    Latest Ref Rng & Units 08/13/2022    9:41 AM 04/05/2022   10:22 AM 03/27/2022    9:29 AM  CMP  Glucose 70 - 99 mg/dL 95  95  272   BUN 8 - 23 mg/dL 47  27  32   Creatinine 0.61 - 1.24 mg/dL 5.36  6.44  0.34   Sodium 135 - 145 mmol/L 146  143  142   Potassium 3.5 - 5.1 mmol/L 4.3  4.5  4.2   Chloride 98 - 111 mmol/L 112  102  105   CO2 22 - 32 mmol/L 24  26  26    Calcium 8.9 - 10.3 mg/dL 74.2  59.5  63.8        Edman Circle, MD 08/20/2022, 7:40 AM  Cc: Gordan Payment., MD

## 2022-08-20 NOTE — Patient Instructions (Signed)
YOU HAD AN ENDOSCOPIC PROCEDURE TODAY AT THE [facility]:   Refer to the procedure report that was given to you for any specific questions about what was found during the examination.  If the procedure report does not answer your questions, please call your gastroenterologist to clarify.  If you requested that your care partner not be given the details of your procedure findings, then the procedure report has been included in a sealed envelope for you to review at your convenience later.  YOU SHOULD EXPECT: Some feelings of bloating in the abdomen. Passage of more gas than usual.  Walking can help get rid of the air that was put into your GI tract during the procedure and reduce the bloating. If you had a lower endoscopy (such as a colonoscopy or flexible sigmoidoscopy) you may notice spotting of blood in your stool or on the toilet paper. If you underwent a bowel prep for your procedure, you may not have a normal bowel movement for a few days.  Please Note:  You might notice some irritation and congestion in your nose or some drainage.  This is from the oxygen used during your procedure.  There is no need for concern and it should clear up in a day or so.  SYMPTOMS TO REPORT IMMEDIATELY:  Following lower endoscopy (colonoscopy or flexible sigmoidoscopy):  Excessive amounts of blood in the stool  Significant tenderness or worsening of abdominal pains  Swelling of the abdomen that is new, acute  Fever of 100F or higher  For urgent or emergent issues, a gastroenterologist can be reached at any hour by calling [phone]. Do not use MyChart messaging for urgent concerns.    DIET:  We do recommend a small meal at first, but then you may proceed to your regular diet.  Drink plenty of fluids but you should avoid alcoholic beverages for 24 hours.  ACTIVITY:  You should plan to take it easy for the rest of today and you should NOT DRIVE or use heavy machinery until tomorrow (because of  the sedation medicines used during the test).    FOLLOW UP: Our staff will call the number listed on your records the next business day following your procedure.  We will call around 7:15- 8:00 am to check on you and address any questions or concerns that you may have regarding the information given to you following your procedure. If we do not reach you, we will leave a message.     If any biopsies were taken you will be contacted by phone or by letter within the next 1-3 weeks.  Please call us at [phone] if you have not heard about the biopsies in 3 weeks.    SIGNATURES/CONFIDENTIALITY: You and/or your care partner have signed paperwork which will be entered into your electronic medical record.  These signatures attest to the fact that that the information above on your After Visit Summary has been reviewed and is understood.  Full responsibility of the confidentiality of this discharge information lies with you and/or your care-partner.  

## 2022-08-20 NOTE — Progress Notes (Signed)
Vss nad trans to pacu 

## 2022-08-20 NOTE — Op Note (Signed)
[facility] Patient Name: Matthew Mcneil Procedure Date: 08/20/2022 7:21 AM MRN: [id number] Endoscopist: Lynann Bologna , MD, [id number] Age: 82 Referring MD:  Date of Birth: 26-Oct-1940 Gender: Male Account #: [id number] Procedure:                Colonoscopy Indications:              1. Newly diagnosed colovesicular fistula-for                            surgery tomorrow. 2. High risk colon cancer                            surveillance: Personal history of colonic polyps Medicines:                Monitored Anesthesia Care Procedure:                Pre-Anesthesia Assessment:                           - Prior to the procedure, a History and Physical                            was performed, and patient medications and                            allergies were reviewed. The patient's tolerance of                            previous anesthesia was also reviewed. The risks                            and benefits of the procedure and the sedation                            options and risks were discussed with the patient.                            All questions were answered, and informed consent                            was obtained. Prior Anticoagulants: The patient has                            taken no anticoagulant or antiplatelet agents. ASA                            Grade Assessment: II - A patient with mild systemic                            disease. After reviewing the risks and benefits,                            the patient was deemed in satisfactory condition to  undergo the procedure.                           After obtaining informed consent, the colonoscope                            was passed under direct vision. Throughout the                            procedure, the patient's blood pressure, pulse, and                            oxygen saturations were monitored continuously. The                            CF [id number]  #[id number] was introduced through the anus                            and advanced to the 2 cm into the ileum. The                            colonoscopy was performed without difficulty. The                            patient tolerated the procedure well. The quality                            of the bowel preparation was good. The terminal                            ileum, ileocecal valve, appendiceal orifice, and                            rectum were photographed. Scope In: 7:44:47 AM Scope Out: 8:03:57 AM Scope Withdrawal Time: 0 hours 13 minutes 52 seconds  Total Procedure Duration: 0 hours 19 minutes 10 seconds  Findings:                 Three sessile polyps were found in the mid                            descending colon, mid ascending colon and distal                            ascending colon. The polyps were 4 to 6 mm in size.                            These polyps were removed with a cold snare.                            Resection and retrieval were complete.                           Multiple medium-mouthed diverticula were found in  the sigmoid colon, few in descending colon and                            ascending colon. It would give sigmoid colon a                            "Swiss cheese appearance". There was luminal                            narrowing consistent with muscular hypertrophy.                           Majority of the diverticula were within 45 cm                            withdrawal of the scope. No obvious fistula was                            identified. No masses.                           Non-bleeding internal hemorrhoids were found during                            retroflexion. The hemorrhoids were small and Grade                            I (internal hemorrhoids that do not prolapse).                           The terminal ileum appeared normal.                           The exam was otherwise without abnormality on                             direct and retroflexion views. Complications:            No immediate complications. Estimated Blood Loss:     Estimated blood loss: none. Impression:               - Three 4 to 6 mm polyps in the mid descending                            colon, in the mid ascending colon and in the distal                            ascending colon, removed with a cold snare.                            Resected and retrieved. No masses.                           - Moderate to severe predominantly sigmoid  diverticulosis.                           - Mild right colonic diverticulosis.                           - Non-bleeding internal hemorrhoids.                           - The examined portion of the ileum was normal.                           - The examination was otherwise normal on direct                            and retroflexion views. Recommendation:           - Patient has a contact number available for                            emergencies. The signs and symptoms of potential                            delayed complications were discussed with the                            patient. Return to normal activities tomorrow.                            Written discharge instructions were provided to the                            patient.                           - Clear liquid diet. Preop as per surgery.                           - Continue present medications.                           - Await pathology results.                           - Repeat colonoscopy is not recommended for                            screening purposes.                           - The findings and recommendations were discussed                            with the patient's family. Lynann Bologna, MD 08/20/2022 8:12:24 AM This report has been signed electronically.

## 2022-08-20 NOTE — Progress Notes (Signed)
Called to room to assist during endoscopic procedure.  Patient ID and intended procedure confirmed with present staff. Received instructions for my participation in the procedure from the performing physician.  

## 2022-08-20 NOTE — Progress Notes (Signed)
Pt's states no medical or surgical changes since previsit or office visit. 

## 2022-08-21 ENCOUNTER — Inpatient Hospital Stay (HOSPITAL_COMMUNITY): Payer: Medicare HMO | Admitting: Physician Assistant

## 2022-08-21 ENCOUNTER — Telehealth: Payer: Self-pay

## 2022-08-21 ENCOUNTER — Inpatient Hospital Stay (HOSPITAL_COMMUNITY)
Admission: RE | Admit: 2022-08-21 | Discharge: 2022-08-23 | DRG: 660 | Disposition: A | Discharge status: Home / Self Care | Payer: Medicare HMO | Attending: Surgery | Admitting: Surgery

## 2022-08-21 ENCOUNTER — Encounter (HOSPITAL_COMMUNITY): Payer: Self-pay | Admitting: Surgery

## 2022-08-21 ENCOUNTER — Other Ambulatory Visit: Payer: Self-pay

## 2022-08-21 ENCOUNTER — Encounter (HOSPITAL_COMMUNITY)
Admission: RE | Disposition: A | Discharge status: Home / Self Care | Payer: Self-pay | Source: Home / Self Care | Attending: Surgery

## 2022-08-21 ENCOUNTER — Inpatient Hospital Stay (HOSPITAL_COMMUNITY): Payer: Medicare HMO | Admitting: Certified Registered"

## 2022-08-21 ENCOUNTER — Ambulatory Visit: Payer: Self-pay | Admitting: Surgery

## 2022-08-21 DIAGNOSIS — K660 Peritoneal adhesions (postprocedural) (postinfection): Secondary | ICD-10-CM | POA: Diagnosis present

## 2022-08-21 DIAGNOSIS — F419 Anxiety disorder, unspecified: Secondary | ICD-10-CM | POA: Diagnosis present

## 2022-08-21 DIAGNOSIS — N321 Vesicointestinal fistula: Principal | ICD-10-CM | POA: Diagnosis present

## 2022-08-21 DIAGNOSIS — Z833 Family history of diabetes mellitus: Secondary | ICD-10-CM

## 2022-08-21 DIAGNOSIS — R319 Hematuria, unspecified: Secondary | ICD-10-CM | POA: Diagnosis not present

## 2022-08-21 DIAGNOSIS — D631 Anemia in chronic kidney disease: Secondary | ICD-10-CM | POA: Diagnosis present

## 2022-08-21 DIAGNOSIS — I252 Old myocardial infarction: Secondary | ICD-10-CM

## 2022-08-21 DIAGNOSIS — Z8673 Personal history of transient ischemic attack (TIA), and cerebral infarction without residual deficits: Secondary | ICD-10-CM

## 2022-08-21 DIAGNOSIS — Z953 Presence of xenogenic heart valve: Secondary | ICD-10-CM

## 2022-08-21 DIAGNOSIS — K5732 Diverticulitis of large intestine without perforation or abscess without bleeding: Secondary | ICD-10-CM | POA: Diagnosis present

## 2022-08-21 DIAGNOSIS — Z803 Family history of malignant neoplasm of breast: Secondary | ICD-10-CM

## 2022-08-21 DIAGNOSIS — E669 Obesity, unspecified: Secondary | ICD-10-CM | POA: Diagnosis present

## 2022-08-21 DIAGNOSIS — N1831 Chronic kidney disease, stage 3a: Secondary | ICD-10-CM | POA: Diagnosis present

## 2022-08-21 DIAGNOSIS — Z6830 Body mass index (BMI) 30.0-30.9, adult: Secondary | ICD-10-CM

## 2022-08-21 DIAGNOSIS — H3530 Unspecified macular degeneration: Secondary | ICD-10-CM | POA: Diagnosis present

## 2022-08-21 DIAGNOSIS — N3289 Other specified disorders of bladder: Secondary | ICD-10-CM | POA: Diagnosis not present

## 2022-08-21 DIAGNOSIS — Z8719 Personal history of other diseases of the digestive system: Secondary | ICD-10-CM

## 2022-08-21 DIAGNOSIS — M1A00X0 Idiopathic chronic gout, unspecified site, without tophus (tophi): Secondary | ICD-10-CM | POA: Diagnosis present

## 2022-08-21 DIAGNOSIS — H409 Unspecified glaucoma: Secondary | ICD-10-CM

## 2022-08-21 DIAGNOSIS — I25119 Atherosclerotic heart disease of native coronary artery with unspecified angina pectoris: Secondary | ICD-10-CM | POA: Diagnosis not present

## 2022-08-21 DIAGNOSIS — N1832 Chronic kidney disease, stage 3b: Secondary | ICD-10-CM

## 2022-08-21 DIAGNOSIS — R7303 Prediabetes: Secondary | ICD-10-CM

## 2022-08-21 DIAGNOSIS — I129 Hypertensive chronic kidney disease with stage 1 through stage 4 chronic kidney disease, or unspecified chronic kidney disease: Secondary | ICD-10-CM

## 2022-08-21 DIAGNOSIS — Z9861 Coronary angioplasty status: Secondary | ICD-10-CM

## 2022-08-21 DIAGNOSIS — E538 Deficiency of other specified B group vitamins: Secondary | ICD-10-CM | POA: Diagnosis present

## 2022-08-21 DIAGNOSIS — R739 Hyperglycemia, unspecified: Secondary | ICD-10-CM

## 2022-08-21 DIAGNOSIS — Z87440 Personal history of urinary (tract) infections: Secondary | ICD-10-CM

## 2022-08-21 DIAGNOSIS — N400 Enlarged prostate without lower urinary tract symptoms: Secondary | ICD-10-CM | POA: Diagnosis present

## 2022-08-21 DIAGNOSIS — Z87442 Personal history of urinary calculi: Secondary | ICD-10-CM

## 2022-08-21 DIAGNOSIS — I10 Essential (primary) hypertension: Secondary | ICD-10-CM | POA: Diagnosis present

## 2022-08-21 DIAGNOSIS — Z8616 Personal history of COVID-19: Secondary | ICD-10-CM | POA: Diagnosis not present

## 2022-08-21 DIAGNOSIS — E785 Hyperlipidemia, unspecified: Secondary | ICD-10-CM | POA: Diagnosis present

## 2022-08-21 DIAGNOSIS — Z8249 Family history of ischemic heart disease and other diseases of the circulatory system: Secondary | ICD-10-CM

## 2022-08-21 DIAGNOSIS — Z952 Presence of prosthetic heart valve: Secondary | ICD-10-CM

## 2022-08-21 DIAGNOSIS — E7800 Pure hypercholesterolemia, unspecified: Secondary | ICD-10-CM | POA: Diagnosis present

## 2022-08-21 HISTORY — PX: PROCTOSCOPY: SHX2266

## 2022-08-21 LAB — TYPE AND SCREEN
ABO/RH(D): O POS
Antibody Screen: NEGATIVE

## 2022-08-21 LAB — HEMOGLOBIN A1C
Hgb A1c MFr Bld: 5.4 % (ref 4.8–5.6)
Mean Plasma Glucose: 108.28 mg/dL

## 2022-08-21 SURGERY — COLECTOMY, PARTIAL, ROBOT-ASSISTED, LAPAROSCOPIC
Anesthesia: General

## 2022-08-21 MED ORDER — ROCURONIUM BROMIDE 10 MG/ML (PF) SYRINGE
PREFILLED_SYRINGE | INTRAVENOUS | Status: AC
  Filled 2022-08-21: qty 10

## 2022-08-21 MED ORDER — SODIUM CHLORIDE 0.9 % IV SOLN
2.0000 g | INTRAVENOUS | Status: AC
  Administered 2022-08-21: 2 g via INTRAVENOUS
  Filled 2022-08-21: qty 2

## 2022-08-21 MED ORDER — EPHEDRINE SULFATE-NACL 50-0.9 MG/10ML-% IV SOSY
PREFILLED_SYRINGE | INTRAVENOUS | Status: DC | PRN
  Administered 2022-08-21: 10 mg via INTRAVENOUS

## 2022-08-21 MED ORDER — DIPHENHYDRAMINE HCL 12.5 MG/5ML PO ELIX: 12.5000 mg | ORAL_SOLUTION | Freq: Four times a day (QID) | ORAL | Status: DC | PRN

## 2022-08-21 MED ORDER — FENTANYL CITRATE (PF) 250 MCG/5ML IJ SOLN
INTRAMUSCULAR | Status: AC
  Filled 2022-08-21: qty 5

## 2022-08-21 MED ORDER — PROCHLORPERAZINE EDISYLATE 10 MG/2ML IJ SOLN: 5.0000 mg | Freq: Four times a day (QID) | INTRAMUSCULAR | Status: DC | PRN

## 2022-08-21 MED ORDER — SODIUM CHLORIDE 0.9 % IR SOLN
Status: DC | PRN
  Administered 2022-08-21: 1000 mL via INTRAVESICAL

## 2022-08-21 MED ORDER — ENSURE SURGERY PO LIQD
237.0000 mL | Freq: Two times a day (BID) | ORAL | Status: DC
  Administered 2022-08-22: 237 mL via ORAL

## 2022-08-21 MED ORDER — BUPIVACAINE LIPOSOME 1.3 % IJ SUSP
INTRAMUSCULAR | Status: DC | PRN
  Administered 2022-08-21: 20 mL

## 2022-08-21 MED ORDER — ENOXAPARIN SODIUM 40 MG/0.4ML IJ SOSY: 40.0000 mg | PREFILLED_SYRINGE | INTRAMUSCULAR | Status: DC

## 2022-08-21 MED ORDER — ENSURE PRE-SURGERY PO LIQD: 296.0000 mL | Freq: Once | ORAL | Status: DC

## 2022-08-21 MED ORDER — SUGAMMADEX SODIUM 200 MG/2ML IV SOLN
INTRAVENOUS | Status: DC | PRN
  Administered 2022-08-21: 400 mg via INTRAVENOUS

## 2022-08-21 MED ORDER — NITROGLYCERIN 0.4 MG SL SUBL: 0.4000 mg | SUBLINGUAL_TABLET | SUBLINGUAL | Status: DC | PRN

## 2022-08-21 MED ORDER — SODIUM CHLORIDE 0.9% FLUSH
3.0000 mL | Freq: Two times a day (BID) | INTRAVENOUS | Status: DC
  Administered 2022-08-22: 3 mL via INTRAVENOUS

## 2022-08-21 MED ORDER — METHYLENE BLUE 1 % INJ SOLN
INTRAVENOUS | Status: AC
  Filled 2022-08-21: qty 10

## 2022-08-21 MED ORDER — PROMETHAZINE HCL 25 MG/ML IJ SOLN: 6.2500 mg | INTRAMUSCULAR | Status: DC | PRN

## 2022-08-21 MED ORDER — SUCCINYLCHOLINE CHLORIDE 200 MG/10ML IV SOSY
PREFILLED_SYRINGE | INTRAVENOUS | Status: AC
  Filled 2022-08-21: qty 10

## 2022-08-21 MED ORDER — ACETAMINOPHEN 500 MG PO TABS
1000.0000 mg | ORAL_TABLET | Freq: Four times a day (QID) | ORAL | Status: DC
  Administered 2022-08-21 – 2022-08-23 (×6): 1000 mg via ORAL
  Filled 2022-08-21 (×7): qty 2

## 2022-08-21 MED ORDER — ENSURE PRE-SURGERY PO LIQD: 592.0000 mL | Freq: Once | ORAL | Status: DC

## 2022-08-21 MED ORDER — LIP MEDEX EX OINT
TOPICAL_OINTMENT | Freq: Two times a day (BID) | CUTANEOUS | Status: DC
  Administered 2022-08-21: 75 via TOPICAL
  Administered 2022-08-22: 1 via TOPICAL
  Filled 2022-08-21 (×2): qty 7

## 2022-08-21 MED ORDER — ALVIMOPAN 12 MG PO CAPS
12.0000 mg | ORAL_CAPSULE | ORAL | Status: AC
  Administered 2022-08-21: 12 mg via ORAL
  Filled 2022-08-21: qty 1

## 2022-08-21 MED ORDER — CYANOCOBALAMIN 500 MCG PO TABS
500.0000 ug | ORAL_TABLET | ORAL | Status: DC
  Administered 2022-08-22: 500 ug via ORAL
  Filled 2022-08-21: qty 1

## 2022-08-21 MED ORDER — AMLODIPINE BESYLATE 5 MG PO TABS
5.0000 mg | ORAL_TABLET | Freq: Every day | ORAL | Status: DC
  Administered 2022-08-22 – 2022-08-23 (×2): 5 mg via ORAL
  Filled 2022-08-21 (×2): qty 1

## 2022-08-21 MED ORDER — FINASTERIDE 5 MG PO TABS
5.0000 mg | ORAL_TABLET | Freq: Every day | ORAL | Status: DC
  Administered 2022-08-21 – 2022-08-22 (×2): 5 mg via ORAL
  Filled 2022-08-21 (×2): qty 1

## 2022-08-21 MED ORDER — MAGIC MOUTHWASH: 15.0000 mL | Freq: Four times a day (QID) | ORAL | Status: DC | PRN

## 2022-08-21 MED ORDER — HYDROMORPHONE HCL 1 MG/ML IJ SOLN: 0.5000 mg | INTRAMUSCULAR | Status: DC | PRN

## 2022-08-21 MED ORDER — STERILE WATER FOR IRRIGATION IR SOLN
Status: DC | PRN
  Administered 2022-08-21: 1000 mL

## 2022-08-21 MED ORDER — ORAL CARE MOUTH RINSE: 15.0000 mL | Freq: Once | OROMUCOSAL | Status: AC

## 2022-08-21 MED ORDER — LACTATED RINGERS IR SOLN
Status: DC | PRN
  Administered 2022-08-21: 1000 mL

## 2022-08-21 MED ORDER — CHLORHEXIDINE GLUCONATE 0.12 % MT SOLN
15.0000 mL | Freq: Once | OROMUCOSAL | Status: AC
  Administered 2022-08-21: 15 mL via OROMUCOSAL

## 2022-08-21 MED ORDER — PROCHLORPERAZINE MALEATE 10 MG PO TABS: 10.0000 mg | ORAL_TABLET | Freq: Four times a day (QID) | ORAL | Status: DC | PRN

## 2022-08-21 MED ORDER — PROPOFOL 10 MG/ML IV BOLUS
INTRAVENOUS | Status: AC
  Filled 2022-08-21: qty 20

## 2022-08-21 MED ORDER — LACTATED RINGERS IV BOLUS: 1000.0000 mL | Freq: Three times a day (TID) | INTRAVENOUS | Status: DC | PRN

## 2022-08-21 MED ORDER — LIDOCAINE 2% (20 MG/ML) 5 ML SYRINGE
INTRAMUSCULAR | Status: DC | PRN
  Administered 2022-08-21: 1.5 mg/kg/h via INTRAVENOUS
  Administered 2022-08-21: 80 mg via INTRAVENOUS

## 2022-08-21 MED ORDER — LACTATED RINGERS IV SOLN: INTRAVENOUS | Status: DC

## 2022-08-21 MED ORDER — BUPIVACAINE LIPOSOME 1.3 % IJ SUSP
INTRAMUSCULAR | Status: AC
  Filled 2022-08-21: qty 20

## 2022-08-21 MED ORDER — SIMETHICONE 80 MG PO CHEW
40.0000 mg | CHEWABLE_TABLET | Freq: Four times a day (QID) | ORAL | Status: DC | PRN
  Administered 2022-08-21: 40 mg via ORAL
  Filled 2022-08-21: qty 1

## 2022-08-21 MED ORDER — METRONIDAZOLE 500 MG PO TABS: 1000.0000 mg | ORAL_TABLET | ORAL | Status: DC

## 2022-08-21 MED ORDER — ALVIMOPAN 12 MG PO CAPS
12.0000 mg | ORAL_CAPSULE | Freq: Two times a day (BID) | ORAL | Status: DC
  Filled 2022-08-21: qty 1

## 2022-08-21 MED ORDER — ONDANSETRON HCL 4 MG/2ML IJ SOLN
INTRAMUSCULAR | Status: AC
  Filled 2022-08-21: qty 2

## 2022-08-21 MED ORDER — PHENYLEPHRINE HCL (PRESSORS) 10 MG/ML IV SOLN
INTRAVENOUS | Status: AC
  Filled 2022-08-21: qty 1

## 2022-08-21 MED ORDER — SODIUM CHLORIDE 0.9 % IR SOLN: 3000.0000 mL | Status: DC

## 2022-08-21 MED ORDER — IRBESARTAN 150 MG PO TABS
150.0000 mg | ORAL_TABLET | Freq: Every day | ORAL | Status: DC
  Administered 2022-08-21 – 2022-08-23 (×3): 150 mg via ORAL
  Filled 2022-08-21 (×3): qty 1

## 2022-08-21 MED ORDER — DIPHENHYDRAMINE HCL 50 MG/ML IJ SOLN: 12.5000 mg | Freq: Four times a day (QID) | INTRAMUSCULAR | Status: DC | PRN

## 2022-08-21 MED ORDER — INDOCYANINE GREEN 25 MG IV SOLR
INTRAVENOUS | Status: DC | PRN
  Administered 2022-08-21: 5 mg via INTRAVENOUS

## 2022-08-21 MED ORDER — HYDRALAZINE HCL 20 MG/ML IJ SOLN: 10.0000 mg | INTRAMUSCULAR | Status: DC | PRN

## 2022-08-21 MED ORDER — SODIUM CHLORIDE 0.9 % IV SOLN
2.0000 g | Freq: Two times a day (BID) | INTRAVENOUS | Status: AC
  Administered 2022-08-21: 2 g via INTRAVENOUS
  Filled 2022-08-21: qty 2

## 2022-08-21 MED ORDER — METHOCARBAMOL 1000 MG/10ML IJ SOLN: 1000.0000 mg | Freq: Four times a day (QID) | INTRAVENOUS | Status: DC | PRN

## 2022-08-21 MED ORDER — VITAMIN C 500 MG PO TABS
500.0000 mg | ORAL_TABLET | Freq: Every day | ORAL | Status: DC
  Administered 2022-08-21 – 2022-08-22 (×2): 500 mg via ORAL
  Filled 2022-08-21 (×2): qty 1

## 2022-08-21 MED ORDER — LACTATED RINGERS IV SOLN: INTRAVENOUS | Status: DC | PRN

## 2022-08-21 MED ORDER — METHOCARBAMOL 500 MG PO TABS
1000.0000 mg | ORAL_TABLET | Freq: Four times a day (QID) | ORAL | Status: DC | PRN
  Filled 2022-08-21: qty 2

## 2022-08-21 MED ORDER — BUPIVACAINE-EPINEPHRINE (PF) 0.25% -1:200000 IJ SOLN
INTRAMUSCULAR | Status: DC | PRN
  Administered 2022-08-21: 50 mL

## 2022-08-21 MED ORDER — METOPROLOL TARTRATE 5 MG/5ML IV SOLN: 5.0000 mg | Freq: Four times a day (QID) | INTRAVENOUS | Status: DC | PRN

## 2022-08-21 MED ORDER — OXYCODONE HCL 5 MG PO TABS: 5.0000 mg | ORAL_TABLET | Freq: Once | ORAL | Status: DC | PRN

## 2022-08-21 MED ORDER — SODIUM CHLORIDE 0.9 % IV SOLN: 250.0000 mL | INTRAVENOUS | Status: DC | PRN

## 2022-08-21 MED ORDER — STERILE WATER FOR INJECTION IJ SOLN
INTRAMUSCULAR | Status: DC | PRN
  Administered 2022-08-21: 15 mL

## 2022-08-21 MED ORDER — STERILE WATER FOR INJECTION IJ SOLN
INTRAMUSCULAR | Status: AC
  Filled 2022-08-21: qty 10

## 2022-08-21 MED ORDER — PHENOL 1.4 % MT LIQD: 2.0000 | OROMUCOSAL | Status: DC | PRN

## 2022-08-21 MED ORDER — ONDANSETRON HCL 4 MG/2ML IJ SOLN
INTRAMUSCULAR | Status: DC | PRN
  Administered 2022-08-21: 4 mg via INTRAVENOUS

## 2022-08-21 MED ORDER — ALUM & MAG HYDROXIDE-SIMETH 200-200-20 MG/5ML PO SUSP: 30.0000 mL | Freq: Four times a day (QID) | ORAL | Status: DC | PRN

## 2022-08-21 MED ORDER — LATANOPROST 0.005 % OP SOLN
1.0000 [drp] | Freq: Every day | OPHTHALMIC | Status: DC
  Administered 2022-08-21 – 2022-08-22 (×2): 1 [drp] via OPHTHALMIC
  Filled 2022-08-21: qty 2.5

## 2022-08-21 MED ORDER — MENTHOL 3 MG MT LOZG: 1.0000 | LOZENGE | OROMUCOSAL | Status: DC | PRN

## 2022-08-21 MED ORDER — PROPOFOL 10 MG/ML IV BOLUS
INTRAVENOUS | Status: DC | PRN
  Administered 2022-08-21: 130 mg via INTRAVENOUS

## 2022-08-21 MED ORDER — ASPIRIN 81 MG PO TBEC
81.0000 mg | DELAYED_RELEASE_TABLET | Freq: Every day | ORAL | Status: DC
  Administered 2022-08-21 – 2022-08-22 (×2): 81 mg via ORAL
  Filled 2022-08-21 (×2): qty 1

## 2022-08-21 MED ORDER — ONDANSETRON HCL 4 MG/2ML IJ SOLN
4.0000 mg | Freq: Four times a day (QID) | INTRAMUSCULAR | Status: DC | PRN
  Filled 2022-08-21: qty 2

## 2022-08-21 MED ORDER — ROCURONIUM BROMIDE 10 MG/ML (PF) SYRINGE
PREFILLED_SYRINGE | INTRAVENOUS | Status: DC | PRN
  Administered 2022-08-21: 80 mg via INTRAVENOUS
  Administered 2022-08-21: 20 mg via INTRAVENOUS

## 2022-08-21 MED ORDER — POLYETHYLENE GLYCOL 3350 17 GM/SCOOP PO POWD: 1.0000 | Freq: Once | ORAL | Status: DC

## 2022-08-21 MED ORDER — TRAMADOL HCL 50 MG PO TABS
50.0000 mg | ORAL_TABLET | Freq: Four times a day (QID) | ORAL | Status: DC | PRN
  Administered 2022-08-21: 50 mg via ORAL
  Filled 2022-08-21 (×2): qty 1

## 2022-08-21 MED ORDER — NEOMYCIN SULFATE 500 MG PO TABS: 1000.0000 mg | ORAL_TABLET | ORAL | Status: DC

## 2022-08-21 MED ORDER — ESCITALOPRAM OXALATE 10 MG PO TABS
10.0000 mg | ORAL_TABLET | Freq: Every day | ORAL | Status: DC
  Administered 2022-08-22 – 2022-08-23 (×2): 10 mg via ORAL
  Filled 2022-08-21 (×2): qty 1

## 2022-08-21 MED ORDER — BUPIVACAINE-EPINEPHRINE 0.25% -1:200000 IJ SOLN
INTRAMUSCULAR | Status: AC
  Filled 2022-08-21: qty 1

## 2022-08-21 MED ORDER — MELATONIN 3 MG PO TABS
3.0000 mg | ORAL_TABLET | Freq: Every evening | ORAL | Status: DC | PRN
  Filled 2022-08-21: qty 1

## 2022-08-21 MED ORDER — ONDANSETRON HCL 4 MG PO TABS: 4.0000 mg | ORAL_TABLET | Freq: Four times a day (QID) | ORAL | Status: DC | PRN

## 2022-08-21 MED ORDER — GABAPENTIN 300 MG PO CAPS
300.0000 mg | ORAL_CAPSULE | ORAL | Status: AC
  Administered 2022-08-21: 300 mg via ORAL
  Filled 2022-08-21: qty 3

## 2022-08-21 MED ORDER — AMISULPRIDE (ANTIEMETIC) 5 MG/2ML IV SOLN: 10.0000 mg | Freq: Once | INTRAVENOUS | Status: DC | PRN

## 2022-08-21 MED ORDER — ENOXAPARIN SODIUM 40 MG/0.4ML IJ SOSY
40.0000 mg | PREFILLED_SYRINGE | Freq: Once | INTRAMUSCULAR | Status: AC
  Administered 2022-08-21: 40 mg via SUBCUTANEOUS
  Filled 2022-08-21: qty 0.4

## 2022-08-21 MED ORDER — BUPIVACAINE LIPOSOME 1.3 % IJ SUSP: 20.0000 mL | Freq: Once | INTRAMUSCULAR | Status: DC

## 2022-08-21 MED ORDER — 0.9 % SODIUM CHLORIDE (POUR BTL) OPTIME
TOPICAL | Status: DC | PRN
  Administered 2022-08-21: 2000 mL

## 2022-08-21 MED ORDER — ENOXAPARIN SODIUM 40 MG/0.4ML IJ SOSY
40.0000 mg | PREFILLED_SYRINGE | INTRAMUSCULAR | Status: DC
Start: 2022-08-23 — End: 2022-08-21

## 2022-08-21 MED ORDER — ALLOPURINOL 100 MG PO TABS
100.0000 mg | ORAL_TABLET | Freq: Two times a day (BID) | ORAL | Status: DC
  Administered 2022-08-21 – 2022-08-23 (×4): 100 mg via ORAL
  Filled 2022-08-21 (×4): qty 1

## 2022-08-21 MED ORDER — FENTANYL CITRATE (PF) 100 MCG/2ML IJ SOLN
INTRAMUSCULAR | Status: DC | PRN
  Administered 2022-08-21: 100 ug via INTRAVENOUS
  Administered 2022-08-21: 50 ug via INTRAVENOUS

## 2022-08-21 MED ORDER — HYDROMORPHONE HCL 1 MG/ML IJ SOLN: 0.2500 mg | INTRAMUSCULAR | Status: DC | PRN

## 2022-08-21 MED ORDER — OXYCODONE HCL 5 MG/5ML PO SOLN: 5.0000 mg | Freq: Once | ORAL | Status: DC | PRN

## 2022-08-21 MED ORDER — BISACODYL 5 MG PO TBEC: 20.0000 mg | DELAYED_RELEASE_TABLET | Freq: Once | ORAL | Status: DC

## 2022-08-21 MED ORDER — LACTATED RINGERS IV SOLN: INTRAVENOUS | Status: AC

## 2022-08-21 MED ORDER — ACETAMINOPHEN 500 MG PO TABS
1000.0000 mg | ORAL_TABLET | ORAL | Status: DC
  Filled 2022-08-21: qty 2

## 2022-08-21 MED ORDER — SODIUM CHLORIDE 0.9% FLUSH: 3.0000 mL | INTRAVENOUS | Status: DC | PRN

## 2022-08-21 MED ORDER — DEXAMETHASONE SODIUM PHOSPHATE 10 MG/ML IJ SOLN
INTRAMUSCULAR | Status: AC
  Filled 2022-08-21: qty 1

## 2022-08-21 MED ORDER — STERILE WATER FOR IRRIGATION IR SOLN
Status: DC | PRN
  Administered 2022-08-21: 300 mL via INTRAVESICAL

## 2022-08-21 SURGICAL SUPPLY — 121 items
ADAPTER GOLDBERG URETERAL (ADAPTER) IMPLANT
ADPR CATH 15X14FR FL DRN BG (ADAPTER)
APL PRP STRL LF DISP 70% ISPRP (MISCELLANEOUS) ×1
APPLIER CLIP 5 13 M/L LIGAMAX5 (MISCELLANEOUS)
APPLIER CLIP ROT 10 11.4 M/L (STAPLE)
APR CLP MED LRG 11.4X10 (STAPLE)
APR CLP MED LRG 5 ANG JAW (MISCELLANEOUS)
BAG COUNTER SPONGE SURGICOUNT (BAG) ×2 IMPLANT
BAG SPNG CNTER NS LX DISP (BAG)
BAG URO CATCHER STRL LF (MISCELLANEOUS) ×2 IMPLANT
BLADE EXTENDED COATED 6.5IN (ELECTRODE) IMPLANT
CANNULA REDUCER 12-8 DVNC XI (CANNULA) IMPLANT
CATH FOLEY 3WAY 5CC 16FR (CATHETERS) IMPLANT
CATH URETL OPEN 5X70 (CATHETERS) IMPLANT
CELLS DAT CNTRL 66122 CELL SVR (MISCELLANEOUS) IMPLANT
CHLORAPREP W/TINT 26 (MISCELLANEOUS) IMPLANT
CLIP APPLIE 5 13 M/L LIGAMAX5 (MISCELLANEOUS) IMPLANT
CLIP APPLIE ROT 10 11.4 M/L (STAPLE) IMPLANT
CLOTH BEACON ORANGE TIMEOUT ST (SAFETY) ×2 IMPLANT
CONNECTOR 5 IN 1 STRAIGHT STRL (MISCELLANEOUS) IMPLANT
COVER SURGICAL LIGHT HANDLE (MISCELLANEOUS) ×4 IMPLANT
COVER TIP SHEARS 8 DVNC (MISCELLANEOUS) ×2 IMPLANT
DEVICE TROCAR PUNCTURE CLOSURE (ENDOMECHANICALS) IMPLANT
DRAIN CHANNEL 19F RND (DRAIN) IMPLANT
DRAPE ARM DVNC X/XI (DISPOSABLE) ×8 IMPLANT
DRAPE COLUMN DVNC XI (DISPOSABLE) ×2 IMPLANT
DRAPE SURG IRRIG POUCH 19X23 (DRAPES) ×2 IMPLANT
DRIVER NDL LRG 8 DVNC XI (INSTRUMENTS) ×2 IMPLANT
DRIVER NDLE LRG 8 DVNC XI (INSTRUMENTS) ×1 IMPLANT
DRSG OPSITE POSTOP 4X10 (GAUZE/BANDAGES/DRESSINGS) IMPLANT
DRSG OPSITE POSTOP 4X6 (GAUZE/BANDAGES/DRESSINGS) IMPLANT
DRSG OPSITE POSTOP 4X8 (GAUZE/BANDAGES/DRESSINGS) IMPLANT
DRSG TEGADERM 2-3/8X2-3/4 SM (GAUZE/BANDAGES/DRESSINGS) ×10 IMPLANT
DRSG TEGADERM 4X4.75 (GAUZE/BANDAGES/DRESSINGS) IMPLANT
ELECT PENCIL ROCKER SW 15FT (MISCELLANEOUS) ×2 IMPLANT
ELECT REM PT RETURN 15FT ADLT (MISCELLANEOUS) ×2 IMPLANT
ENDOLOOP SUT PDS II 0 18 (SUTURE) IMPLANT
EVACUATOR SILICONE 100CC (DRAIN) IMPLANT
GAUZE SPONGE 2X2 8PLY STRL LF (GAUZE/BANDAGES/DRESSINGS) ×2 IMPLANT
GLOVE ECLIPSE 8.0 STRL XLNG CF (GLOVE) ×6 IMPLANT
GLOVE INDICATOR 8.0 STRL GRN (GLOVE) ×6 IMPLANT
GLOVE SURG LX STRL 7.5 STRW (GLOVE) ×2 IMPLANT
GOWN SRG XL LVL 4 BRTHBL STRL (GOWNS) ×2 IMPLANT
GOWN STRL NON-REIN XL LVL4 (GOWNS) ×1
GOWN STRL REUS W/ TWL XL LVL3 (GOWN DISPOSABLE) ×10 IMPLANT
GOWN STRL REUS W/TWL XL LVL3 (GOWN DISPOSABLE) ×5
GRASPER SUT TROCAR 14GX15 (MISCELLANEOUS) IMPLANT
GRASPER TIP-UP FEN DVNC XI (INSTRUMENTS) ×2 IMPLANT
GUIDEWIRE ANG ZIPWIRE 038X150 (WIRE) IMPLANT
GUIDEWIRE STR DUAL SENSOR (WIRE) IMPLANT
HOLDER FOLEY CATH W/STRAP (MISCELLANEOUS) ×2 IMPLANT
IRRIG SUCT STRYKERFLOW 2 WTIP (MISCELLANEOUS) ×1
IRRIGATION SUCT STRKRFLW 2 WTP (MISCELLANEOUS) ×2 IMPLANT
KIT PROCEDURE DVNC SI (MISCELLANEOUS) ×2 IMPLANT
KIT SIGMOIDOSCOPE (SET/KITS/TRAYS/PACK) IMPLANT
KIT TURNOVER KIT A (KITS) IMPLANT
MANIFOLD NEPTUNE II (INSTRUMENTS) ×2 IMPLANT
NDL INSUFFLATION 14GA 120MM (NEEDLE) ×2 IMPLANT
NEEDLE INSUFFLATION 14GA 120MM (NEEDLE) ×1 IMPLANT
PACK CARDIOVASCULAR III (CUSTOM PROCEDURE TRAY) ×2 IMPLANT
PACK COLON (CUSTOM PROCEDURE TRAY) ×2 IMPLANT
PACK CYSTO (CUSTOM PROCEDURE TRAY) ×2 IMPLANT
PAD POSITIONING PINK XL (MISCELLANEOUS) ×2 IMPLANT
PROTECTOR NERVE ULNAR (MISCELLANEOUS) ×4 IMPLANT
RELOAD STAPLE 45 3.5 BLU DVNC (STAPLE) IMPLANT
RELOAD STAPLE 45 4.3 GRN DVNC (STAPLE) IMPLANT
RELOAD STAPLE 60 3.5 BLU DVNC (STAPLE) IMPLANT
RELOAD STAPLE 60 4.3 GRN DVNC (STAPLE) IMPLANT
RELOAD STAPLER 3.5X45 BLU DVNC (STAPLE) IMPLANT
RELOAD STAPLER 3.5X60 BLU DVNC (STAPLE) ×1 IMPLANT
RELOAD STAPLER 4.3X45 GRN DVNC (STAPLE) IMPLANT
RELOAD STAPLER 4.3X60 GRN DVNC (STAPLE) IMPLANT
RETRACTOR WND ALEXIS 18 MED (MISCELLANEOUS) IMPLANT
RTRCTR WOUND ALEXIS 18CM MED (MISCELLANEOUS)
SCISSORS LAP 5X35 DISP (ENDOMECHANICALS) ×2 IMPLANT
SCISSORS MNPLR CVD DVNC XI (INSTRUMENTS) ×2 IMPLANT
SEAL UNIV 5-12 XI (MISCELLANEOUS) ×8 IMPLANT
SEALER VESSEL EXT DVNC XI (MISCELLANEOUS) ×2 IMPLANT
SET IRRIG Y TYPE TUR BLADDER L (SET/KITS/TRAYS/PACK) IMPLANT
SOL ELECTROSURG ANTI STICK (MISCELLANEOUS) ×1
SOLUTION ELECTROSURG ANTI STCK (MISCELLANEOUS) ×2 IMPLANT
SPIKE FLUID TRANSFER (MISCELLANEOUS) ×2 IMPLANT
STAPLER 45 SUREFORM DVNC (STAPLE) IMPLANT
STAPLER 60 SUREFORM DVNC (STAPLE) IMPLANT
STAPLER ECHELON POWER CIR 29 (STAPLE) IMPLANT
STAPLER ECHELON POWER CIR 31 (STAPLE) IMPLANT
STAPLER RELOAD 3.5X45 BLU DVNC (STAPLE)
STAPLER RELOAD 3.5X60 BLU DVNC (STAPLE) ×1
STAPLER RELOAD 4.3X45 GRN DVNC (STAPLE)
STAPLER RELOAD 4.3X60 GRN DVNC (STAPLE)
STOPCOCK 4 WAY LG BORE MALE ST (IV SETS) ×4 IMPLANT
SURGILUBE 2OZ TUBE FLIPTOP (MISCELLANEOUS) IMPLANT
SUT MNCRL AB 4-0 PS2 18 (SUTURE) ×2 IMPLANT
SUT PDS AB 1 CT1 27 (SUTURE) ×4 IMPLANT
SUT PROLENE 0 CT 2 (SUTURE) IMPLANT
SUT PROLENE 2 0 KS (SUTURE) IMPLANT
SUT PROLENE 2 0 SH DA (SUTURE) IMPLANT
SUT SILK 2 0 (SUTURE) ×1
SUT SILK 2 0 SH CR/8 (SUTURE) IMPLANT
SUT SILK 2-0 18XBRD TIE 12 (SUTURE) IMPLANT
SUT SILK 3 0 (SUTURE)
SUT SILK 3 0 SH CR/8 (SUTURE) ×2 IMPLANT
SUT SILK 3-0 18XBRD TIE 12 (SUTURE) IMPLANT
SUT V-LOC BARB 180 2/0GR6 GS22 (SUTURE)
SUT VIC AB 3-0 SH 18 (SUTURE) IMPLANT
SUT VIC AB 3-0 SH 27 (SUTURE)
SUT VIC AB 3-0 SH 27XBRD (SUTURE) IMPLANT
SUT VICRYL 0 UR6 27IN ABS (SUTURE) ×2 IMPLANT
SUTURE V-LC BRB 180 2/0GR6GS22 (SUTURE) IMPLANT
SYR 20ML ECCENTRIC (SYRINGE) ×2 IMPLANT
SYS LAPSCP GELPORT 120MM (MISCELLANEOUS)
SYS WOUND ALEXIS 18CM MED (MISCELLANEOUS) ×1
SYSTEM LAPSCP GELPORT 120MM (MISCELLANEOUS) IMPLANT
SYSTEM WOUND ALEXIS 18CM MED (MISCELLANEOUS) ×2 IMPLANT
TOWEL OR NON WOVEN STRL DISP B (DISPOSABLE) ×2 IMPLANT
TRAY FOLEY MTR SLVR 16FR STAT (SET/KITS/TRAYS/PACK) ×2 IMPLANT
TROCAR ADV FIXATION 5X100MM (TROCAR) ×2 IMPLANT
TUBING CONNECTING 10 (TUBING) ×6 IMPLANT
TUBING INSUFFLATION 10FT LAP (TUBING) ×2 IMPLANT
TUBING UROLOGY SET (TUBING) IMPLANT
URINEMETER 350 (MISCELLANEOUS) IMPLANT

## 2022-08-21 NOTE — Consult Note (Signed)
WOC Nurse ostomy follow up Surgical team following for assessment and plan of care.  Pt did not receive an ostomy today during surgery.  No further role for WOC team. Please re-consult if further assistance is needed.  Thank-you,  Cammie Mcgee MSN, RN, CWOCN, [place], CNS [phone]

## 2022-08-21 NOTE — Anesthesia Postprocedure Evaluation (Signed)
Anesthesia Post Note  Patient: Matthew Mcneil  Procedure(s) Performed: ROBOTIC RESECTION OF COLON- RECTOSIGMOID with intraoperative tissue perfusion/ICG RIGID PROCTOSCOPY CYSTOSCOPY with FIREFLY INJECTION     Patient location during evaluation: PACU Anesthesia Type: General Level of consciousness: awake and alert Pain management: pain level controlled Vital Signs Assessment: post-procedure vital signs reviewed and stable Respiratory status: spontaneous breathing, nonlabored ventilation and respiratory function stable Cardiovascular status: blood pressure returned to baseline and stable Postop Assessment: no apparent nausea or vomiting Anesthetic complications: no   No notable events documented.  Last Vitals:  Vitals:   08/21/22 1145 08/21/22 1200  BP: (!) 155/58 130/65  Pulse: 69 68  Resp: 16 19  Temp:  (!) 36.4 C  SpO2: 100% 100%    Last Pain:  Vitals:   08/21/22 1200  TempSrc:   PainSc: 0-No pain                 Lowella Curb

## 2022-08-21 NOTE — Op Note (Signed)
Preoperative diagnosis:  1. Colovesical fistula  Postoperative diagnosis: 1. same  Procedure(s): 1. Cystoscopy with bilateral ureteral catheterization for firefly injection  Surgeon: Dr. Irine Seal  Anesthesia: general  Complications: none  EBL: <1 cc  Intraoperative findings: some irritation but no obvious fistula  Indication: identification of ureters for abdominal/pelvic surgery  Description of procedure:   With appropriate consent having been obtained, the patient was brought to the operative suite. Anesthesia was induced and the patient was prepped and draped in the usual sterile fashion. A timeout was performed  Thereafter the rigid cystoscope was carefully inserted into the bladder. The bladder was inspected thoroughly and no abnormalities were identified.   The right ureteral orifice was identified and cannulated with a 6 fr open ended ureteral catheter. It was easily advanced to the level of the collecting system. The ureteral catheter was then slowly withdrawn while simultaneously injecting the firefly solution over the length of the ureter. Approximately 8 cc total were injected.  The procedure was then repeated in an identical fashion on the left.   A 20 fr three way foley was then placed in a sterile fashion with immediate return of clear blue/green urine, and the balloon was inflated with 10 cc sterile water.   Irine Seal MD 08/21/2022, 8:58 AM  [facility]  Pager: [phone]

## 2022-08-21 NOTE — Telephone Encounter (Signed)
Left message on follow up call. 

## 2022-08-21 NOTE — Anesthesia Procedure Notes (Signed)
Procedure Name: Intubation Date/Time: 08/21/2022 8:33 AM  Performed by: Ponciano Ort, CRNAPre-anesthesia Checklist: Patient identified, Emergency Drugs available, Suction available and Patient being monitored Patient Re-evaluated:Patient Re-evaluated prior to induction Oxygen Delivery Method: Circle system utilized Preoxygenation: Pre-oxygenation with 100% oxygen Induction Type: IV induction Ventilation: Mask ventilation without difficulty Laryngoscope Size: Mac and 3 Grade View: Grade I Tube type: Oral Tube size: 7.5 mm Number of attempts: 1 Airway Equipment and Method: Stylet and Oral airway Placement Confirmation: ETT inserted through vocal cords under direct vision, positive ETCO2 and breath sounds checked- equal and bilateral Secured at: 22 cm Tube secured with: Tape Dental Injury: Teeth and Oropharynx as per pre-operative assessment

## 2022-08-21 NOTE — Op Note (Signed)
08/21/2022  11:14 AM  PATIENT:  Matthew Mcneil  82 y.o. male  Patient Care Team: Gordan Payment., MD as PCP - General (Internal Medicine) Dulce Sellar Iline Oven, MD as PCP - Cardiology (Cardiology) Karie Soda, MD as Consulting Physician (Colon and Rectal Surgery) Jannifer Hick, MD as Consulting Physician (Urology) Lynann Bologna, MD as Consulting Physician (Gastroenterology) Darleene Cleaver, MD as Consulting Physician (General Surgery)  PRE-OPERATIVE DIAGNOSIS: COLOVESICAL FISTULA  POST-OPERATIVE DIAGNOSIS:  COLOVESICAL FISTULA  PROCEDURE:  LOW ANTERIOR RECTOSIGMOID RESECTION, INTRAOPERATIVE ASSESSMENT OF TISSUE VASCULAR PERFUSION USING ICG (indocyanine green) IMMUNOFLUORESCENCE, and TRANSVERSUS ABDOMINIS PLANE (TAP) BLOCK - BILATERAL  SURGEON:  Ardeth Sportsman, MD  ASSISTANT:  Romie Levee, MD  An experienced assistant was required given the standard of surgical care given the complexity of the case.  This assistant was needed for exposure, dissection, suction, tissue approximation, retraction, perception, etc  ANESTHESIA:  General endotracheal intubation anesthesia (GETA) and Regional TRANSVERSUS ABDOMINIS PLANE (TAP) nerve block -BILATERAL for perioperative & postoperative pain control at the level of the transverse abdominis & preperitoneal spaces along the flank at the anterior axillary line, from subcostal ridge to iliac crest under laparoscopic guidance provided with liposomal bupivacaine (Experel) 20mL mixed with 50 mL of bupivicaine 0.25% with epinephrine  Estimated Blood Loss (EBL):   Total I/O In: 850 [I.V.:750; IV Piggyback:100] Out: 680 [Urine:600; Blood:80].   (See anesthesia record)  Delay start of Pharmacological VTE agent (>24hrs) due to concerns of significant anemia, surgical blood loss, or risk of bleeding?:  no  DRAINS: (None)  SPECIMEN:  Rectosigmoid (open end proximal), Proximal anastomotic ring (FINAL PROXIMAL MARGIN), and Distal anastomotic ring (FINAL  DISTAL MARGIN)  DISPOSITION OF SPECIMEN:  Pathology  COUNTS:  Sponge, needle, & instrument counts CORRECT  PLAN OF CARE: Admit to inpatient   PATIENT DISPOSITION:  PACU - hemodynamically stable.  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  History of diverticulitis.  Endoscopy more consistent with diverticulitis and not malignancy.  I recommended segmental resection:  The anatomy & physiology of the digestive tract was discussed.  The pathophysiology of  fistula between the bowel and bladder was discussed.  Natural history risks without surgery was discussed. I worked to give an overview of the disease and the frequent need to have multispecialty involvement.   I feel the risks of no intervention will lead to serious problems that outweigh the operative risks; therefore, I recommended surgery to treat the pathology.  Robotic & open techniques for partial proctocolectomy with bladder repair were discussed.  Possible fecal diversion by ostomy was discussed.  We will work to preserve anal & pelvic floor function without sacrificing cure.  Need for prolonged bladder catheterization was discussed.  Risks such as bleeding, infection, abscess, leak, injury to other organs, need for repair of tissues / organs, recurrence with reoperation, possible ostomy, hernia, heart attack, death, and other risks were discussed.  I noted a good likelihood this will help address the problem.   Goals of post-operative recovery were discussed as well.  We will work to minimize complications.  An educational handout on the pathology was given as well.  Questions were answered.    The patient expresses understanding & wishes to proceed with surgery.   OR FINDINGS:   Patient had thickened rectosigmoid with adhesions to the left inferolateral dome of the bladder at site of suspected colovesical fistula. No obvious metastatic disease on visceral parietal peritoneum or  liver.  It  is a 29mm EEA anastomosis ( distal descending colon connected to proximal rectum.)  It rests 13 cm from the anal verge by rigid proctoscopy.  CASE DATA:  Type of patient?: Elective [facility] Private Case  Status of Case? Elective Scheduled  Infection Present At Time Of Surgery (PATOS)?  ABSCESS  DESCRIPTION:   Informed consent was confirmed.  The patient underwent general anaesthesia without difficulty.  The patient was positioned appropriately.  VTE prevention in place.  Patient underwent cystoscopy with ICG firefly injection of both ureters per Dr. Lafonda Mosses of [facility].  Please see his separate operative note.  No major orifice found but irritation and dimpling noted at lower dome of the bladder suspected site of the fistula.  The patient was clipped, prepped, & draped in a sterile fashion.  Surgical timeout confirmed our plan.  The patient was positioned in reverse Trendelenburg.  Abdominal entry was gained using Varess technique at the left subcostal ridge on the anterior abdominal wall.  No elevated EtCO2 noted.  Port placed.  Camera inspection revealed no injury.  Extra ports were carefully placed under direct laparoscopic visualization.  Upon entering the abdomen (organ space),we encountered a phlegmon involving the sigmoid colon .   I reflected the greater omentum and the upper abdomen the small bowel in the upper abdomen.  The patient was carefully positioned.  The Intuitive daVinci robot was docked with camera & instruments carefully placed.  The patient had thickening and dense adhesions to the left anterior and lateral pelvis consistent with his colovesical fistula I mobilized the rectosigmoid colon & elevated it to put the main pedicle on tension.  I scored the base of peritoneum of the medial side of the mesentery of the elevated left colon from the ligament of Treitz to the mid rectum.   I elevated the sigmoid mesentery and entered into the retro-mesenteric plane. We were  able to identify the left ureter and gonadal vessels. We kept those posterior within the retroperitoneum and elevated the left colon mesentery off that. I did isolate the inferior mesenteric artery (IMA) pedicle but did not ligate it yet.  I continued distally and got into the avascular plane posterior to the mesorectum, sparing the nervi ergentes.. This allowed me to help mobilize the rectum as well by freeing the mesorectum off the sacrum.  I stayed away from the right and left ureters.  I kept the lateral vascular pedicles to the rectum intact.    Rectosigmoid was somewhat corkscrew and with dense adhesions to the left anterior and lateral pelvis consistent with a colovesical fistula.  I skeletonized the lymph nodes off the inferior mesenteric artery pedicle.  I went down to its takeoff from the aorta.  I isolated the inferior mesenteric vein off of the ligament of Treitz just cephalad to that as well.  After confirming the left ureter was out of the way, I went ahead and ligated the inferior mesenteric artery pedicle just near its takeoff from the aorta.  I did ligate the inferior mesenteric vein in a similar fashion.  We ensured hemostasis.  I continued medial to lateral dissection to free the left colon mesentery off the retroperitoneum going up towards the splenic flexure to allow good mobility and protect the colon mesentery.  Had to use sharp scissors to free off the most dense adhesions on the left anterolateral pelvis where the bladder was divided, clear the site of the colovesical fistula.  Encountered a 2 x 2 centimeter area brackish fluid consistent with a  small chronic abscess.  Evacuated and aspirated and washed.  I mobilized the left colon in a lateral to medial fashion off the retroperitoneum and sidewall attachments along the line of Toldt up towards the splenic flexure to ensure good mobilization of the remaining left colon to reach into the pelvis.   We then focused on mesorectal dissection.   Freed the mesorectum off the presacral plane until I was distal to the concerning region.  Freed off peritoneum on the lateral sidewalls as well and transected the mesentery of the lateral pedicles to get distal to the area of concern.  Came around anteriorly such that I had good circumferential mesorectal excision and a good margin distal to the area of concern.  I chose a region at the descending/sigmoid junction that was soft and easily reached down to the rectal stump.  I skeletonized the mesorectum at the proximal rectum.  We then chose a region for the proximal margin that would reach well for our planned anastomosis (distal descending colon).  Transected the colon mesentery radially to preserve good collateral and marginal artery blood supply. To access vascular perfusion of tissues, we asked anesthesia use intravenous  indocyanine green (ICG) with IV flush.  I switched to the NIR fluorescence (Firefly mode) imaging window on the daVinci robot platform.  We were able to see good light green visualization of blood vessels with good vascular perfusion of tissues, confirming good tissue perfusion of tissues (distal descending colon and proximal/mid rectal junction) planned for anastomosis.  We then transected at the proximal rectum using a single firing of a 60 Miller green load robotic stapler to good result.  I had the circulating team blow up the bladder using methylene blue dyed sterile solution up to 300 mL.  Bladder filled up well with kinking in the left lateral sidewall but no extravasation or against any active bladder leak at this time.  Reassuring.  Foley catheter placed back to gravity drainage.  Robot undocked.  We created an extraction incision through a small Pfannenstiel incision in the suprapubic region.  Placed a wound protector.  I was able to eviscerate the rectosigmoid and descending colon out the wound.   I clamped the colon proximal to this area using a reusable pursestringer device.   Passed a 2-0 Keith needle. I transected at the descending/sigmoid junction with a scalpel. I got healthy bleeding mucosa.  We sent the rectosigmoid colon specimen off to go to pathology.  We sized the colon orifice.  I chose a 29mm EEA anvil stapler system.  I reinforced the prolene pursestring with interrupted silk "belt loop" sutures.  I placed the anvil to the open end of the proximal remaining colon and closed around it using the pursestring.    We did copious irrigation with crystalloid solution.  Hemostasis was good.  The distal end of the remaining colon easily reached down to the rectal stump, therefore, splenic flexure mobilization was not needed.      Dr Maisie Fus scrubbed down and did gentle anal dilation and advanced the EEA stapler up the rectal stump. The spike was brought out at the provimal end of the rectal stump under direct visualization.  I  attached the anvil of the proximal colon the spike of the stapler. Anvil was tightened down and held clamped for 60 seconds.  Orientation was confirmed such that there is no twisting of the colon nor small bowel underneath the mesenteric defect. No concerning tension.  The EEA stapler was fired and held clamped  for 30 seconds. The stapler was released & removed. Blue stitch is in the proximal ring.  Care was taken to ensure no other structures were incorporated within this either.  We noted 2 excellent anastomotic rings.   The colon proximal to the anastomosis was then gently occluded. The pelvis was filled with sterile irrigation.  Dr Maisie Fus  did rigid proctoscopy noted the anastomosis was at 12-13 cm from the anal verge consistent with the proximal rectum.  There was a negative air leak test. There was no tension of mesentery or bowel at the anastomosis.   Tissues looked viable.  Ureters & bowel uninjured.  The anastomosis looked healthy. Greater omentum positioned down into the pelvis to help protect the anastomosis.  Endoluminal gas was evacuated.   Ports & wound protector removed.  We changed gloves & redraped the patient per colon SSI prevention protocol.  We aspirated the sterile irrigation.  Hemostasis was good.  Sterile unused instruments were used from this point.  I closed the skin at the port sites using Monocryl stitch and sterile dressing.  We assured hemostasis and the former ostomy wound.  Wound irrigated.  I closed the posterior rectus fascia with 0 Vicryl suture.  Anterior rectus fascia was closed using #1 PDS transversely.   Sterile dressing placed.   Patient is being extubated go to recovery room. I had discussed postop care with the patient in detail the office & in the holding area. Instructions are written. I discussed operative findings, updated the patient's status, discussed probable steps to recovery, and gave postoperative recommendations to the patient's spouse, Kami Rotar .  Recommendations were made.  Questions were answered.  She expressed understanding & appreciation.  Ardeth Sportsman, M.D., F.A.C.S. Gastrointestinal and Minimally Invasive Surgery [facility] [address] [place], [place] [address] [phone] Main / Paging

## 2022-08-21 NOTE — H&P (Signed)
08/21/2022   REFERRING PHYSICIAN: Garnette Scheuermann, MD  Patient Care Team: Gordan Payment, MD as PCP - General (Internal Medicine) Garnette Scheuermann, MD (Urology) Michaell Cowing, Shawn Route, MD as Consulting Provider (Colon and Rectal Surgery) Lynann Bologna, MD (Gastroenterology) Barry Brunner, MD as Consulting Provider (General Surgery)  PROVIDER: Jarrett Soho, MD  [facility] MRN: [id number] DOB: [birth year]  SUBJECTIVE   Chief Complaint: New Consultation (Colovesicular fistula)   Matthew Mcneil is a 82 y.o. male  who is seen today as an office consultation  at the request of DrCardell Peach  for evaluation of colovescial fistula   History of Present Illness:  Pleasant patient sent from urology. Pneumaturia. CAT scan showing gas in the bladder suspicious for colovesical fistula. I think gets a lot of his care at [facility] in [place]. Recalls being diagnosed with diverticulitis in the past. His wife is with him and definitely manages his healthcare issues. He was admitted last year at [facility] with colon abscess suspicious to be diverticulitis. I cannot get those records but our partner, Dr. Lequita Halt, help take care of the patient. His office notes to talk about probable diverticulitis with an abscess that was removed. Surgery not recommended at this time given his age. Patient has had colonoscopies in the distant past by Dr. Chales Abrahams. Certainly not in the past few years. He claims he moves his bowels every day.  He has some significant cardiac issues. 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 Got through that well. Just on a baby aspirin. He is knees are fair but he claims he can work on the yard for half hour before having to stop. Followed by cardiology closely with good ejection fraction. Denies any abdominal surgery or anorectal surgery. Sounds like this was all detected when he had a positive E. coli UTI that persisted and needed  antibiotics before he got his aortic valve repaired.  Ready for surgery    Medical History:  Past Medical History:  Diagnosis Date  Anxiety  Chronic kidney disease  Glaucoma (increased eye pressure)  Heart valve disease  History of stroke  Hyperlipidemia  Hypertension   Patient Active Problem List  Diagnosis  Colovesical fistula  History of diverticulitis of colon   Past Surgical History:  Procedure Laterality Date  hemmrhoid surgery  REPLACEMENT AORTIC VALVE    Allergies  Allergen Reactions  Spironolactone Anxiety  Statins-Hmg-Coa Reductase Inhibitors Muscle Pain   Current Outpatient Medications on File Prior to Visit  Medication Sig Dispense Refill  allopurinoL (ZYLOPRIM) 100 MG tablet Take by mouth  ascorbic acid, vitamin C, 500 mg Cap Take 1 capsule by mouth once daily  aspirin 81 MG EC tablet Take by mouth once daily  cyanocobalamin (VITAMIN B12) 500 MCG tablet Take by mouth  DOCOSAHEXAENOIC ACID ORAL Take by mouth  escitalopram oxalate (LEXAPRO) 10 MG tablet Take 10 mg by mouth once daily  finasteride (PROSCAR) 5 mg tablet Take by mouth  FUROsemide (LASIX) 40 MG tablet TAKE 1 TABLET IN THE MORNING AND TAKE 1/2 TABLET IN THE EVENING  latanoprost (XALATAN) 0.005 % ophthalmic solution  multivitamin with minerals, EYE, (PRESERVISION AREDS-2) soft gel capsule  olmesartan (BENICAR) 40 MG tablet Take 40 mg by mouth once daily  REPATHA SURECLICK 140 mg/mL PnIj Inject 140 mg into the skin every 14 (fourteen) days.   No current facility-administered medications on file prior to visit.   Family History  Problem Relation Age of Onset  Diabetes Mother  Obesity Mother  Breast cancer Mother  High blood pressure (Hypertension) Father  Obesity Father  Coronary Artery Disease (Blocked arteries around heart) Father  High blood pressure (Hypertension) Brother  Obesity Brother  Coronary Artery Disease (Blocked arteries around heart) Brother  Diabetes Brother     Social History   Tobacco Use  Smoking Status Never  Smokeless Tobacco Never    Social History   Socioeconomic History  Marital status: Married  Tobacco Use  Smoking status: Never  Smokeless tobacco: Never  Vaping Use  Vaping Use: Never used  Substance and Sexual Activity  Alcohol use: Not Currently  Drug use: Never   ############################################################  Review of Systems: A complete review of systems (ROS) was obtained from the patient.  We have reviewed this information and discussed as appropriate with the patient.  See HPI as well for other pertinent ROS.  Constitutional: No fevers, chills, sweats. Weight stable Eyes: No vision changes, No discharge HENT: No sore throats, nasal drainage Lymph: No neck swelling, No bruising easily Pulmonary: No cough, productive sputum CV: No orthopnea, PND . No exertional chest/neck/shoulder/arm pain. Patient can walk 20 minutes without difficulty.   GI: No personal nor family history of GI/colon cancer, inflammatory bowel disease, irritable bowel syndrome, allergy such as Celiac Sprue, dietary/dairy problems, colitis, ulcers nor gastritis. No recent sick contacts/gastroenteritis. No travel outside the country. No changes in diet.  Renal: No UTIs, No hematuria Genital: No drainage, bleeding, masses Musculoskeletal: No severe joint pain. Good ROM major joints Skin: No sores or lesions Heme/Lymph: No easy bleeding. No swollen lymph nodes Neuro: No active seizures. No facial droop Psych: No hallucinations. No agitation  OBJECTIVE   Vitals:  06/11/22 0931  BP: (!) 150/80  Pulse: 86  Temp: 37 C (98.6 F)  SpO2: 94%  Weight: 87.8 kg (193 lb 9.6 oz)  Height: 167.6 cm (5\' 6" )  PainSc: 0-No pain   Body mass index is 31.25 kg/m.  PHYSICAL EXAM:  Constitutional: Not cachectic. Hygeine adequate. Vitals signs as above.  Eyes: No glasses. Vision adequate,Pupils reactive, normal extraocular movements.  Sclera nonicteric Neuro: CN II-XII intact. No major focal sensory defects. No major motor deficits. Lymph: No head/neck/groin lymphadenopathy Psych: No severe agitation. No severe anxiety. Judgment & insight Adequate, Oriented x4, HENT: Normocephalic, Mucus membranes moist. No thrush. Hearing: impaired Neck: Supple, No tracheal deviation. No obvious thyromegaly Chest: No pain to chest wall compression. Good respiratory excursion. No audible wheezing CV: Pulses intact. regular. No major extremity edema Ext: No obvious deformity or contracture. Edema: Not present. No cyanosis Skin: No major subcutaneous nodules. Warm and dry Musculoskeletal: Severe joint rigidity not present. No obvious clubbing. No digital petechiae. Mobility: no assist device moving easily without restrictions  Abdomen: Obese Soft. Nondistended. Nontender. Hernia: Not present. Diastasis recti: Large supraumbilical midline. No hepatomegaly. No splenomegaly.  Genital/Pelvic: Inguinal hernia: Not present. Inguinal lymph nodes: without lymphadenopathy nor hidradenitis.   Rectal: (Deferred)    ###################################################################  Labs, Imaging and Diagnostic Testing:  Located in 'Care Everywhere' section of Epic EMR chart  PRIOR [facility] NOTES:  Located in 'Care Everywhere' section of Epic EMR chart  SURGERY NOTES:  Located in 'Care Everywhere' section of Epic EMR chart  PATHOLOGY:  Located in 'Care Everywhere' section of Epic EMR chart  Assessment and Plan:  DIAGNOSES:  Diagnoses and all orders for this visit:  Colovesical fistula  History of diverticulitis of colon    ASSESSMENT/PLAN  Pleasant active male with history of diverticulitis with prior abscess now  with pneumaturia and workup very suspicious for colovesical fistula.  Standard of care is segmental colonic resection. Reasonable candidate for a robotic sigmoid colectomy with takedown of fistula and possible  primary repair.   Would wish to have urology start the case with cystoscopy and firefly injection to identify the anatomy. Placed three-way Foley so he can blow up the bladder to rule out a leak. Preoperative ostomy marking given his very advanced age in case that is needed temporarily.   The anatomy & physiology of the digestive tract was discussed. The pathophysiology of fistula between the bowel and bladder was discussed. Natural history risks without surgery was discussed. I worked to give an overview of the disease and the frequent need to have multispecialty involvement.   I feel the risks of no intervention will lead to serious problems that outweigh the operative risks; therefore, I recommended surgery to treat the pathology. Laparoscopic & open techniques for partial proctocolectomy with bladder repair were discussed. Possible fecal diversion by ostomy was discussed. We will work to preserve anal & pelvic floor function without sacrificing cure. Need for prolonged bladder catheterization was discussed.  Risks such as bleeding, infection, abscess, leak, injury to other organs, need for repair of tissues / organs, recurrence with reoperation, possible ostomy, hernia, heart attack, death, and other risks were discussed. I noted a good likelihood this will help address the problem. Goals of post-operative recovery were discussed as well. We will work to minimize complications. An educational handout on the pathology was given as well. Questions were answered.   The patient and his better half express understanding & wish to proceed with surgery.   Cardiac clearance.done  Colonoscopy due yesterday showed diverticular disease & no obvious CA

## 2022-08-21 NOTE — Anesthesia Preprocedure Evaluation (Addendum)
Anesthesia Evaluation    Airway Mallampati: II  TM Distance: >3 FB Neck ROM: Full    Dental no notable dental hx.    Pulmonary    Pulmonary exam normal breath sounds clear to auscultation       Cardiovascular hypertension, Pt. 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 Left ventricular ejection fraction, by estimation, is 50 to 55%. The  left ventricle has low normal function. The left ventricle has no regional  wall motion abnormalities. Left ventricular diastolic parameters are  indeterminate.   2. Right ventricular systolic function is normal. The right ventricular  size is normal. There is normal pulmonary artery systolic pressure ( ).  3. Left atrial size was mildly dilated.   4. Right atrial size was mildly dilated.   5. Mild mitral valve regurgitation. Moderate mitral annular  calcification.   6. S/p TAVR ( 26 mm Edwards S3UR, procedure date 03/26/22) Keak and mean  gradients through the valve are 28 and 15 mm Hg AVA (VTI) is 1.75 cm2.  Dimensionless index is 0.42 Compared to echo from 03/27/22 there is a  slight increase in mean gradient (11 to  15 mm Hg ). . The aortic valve has been repaired/replaced. Aortic valve  regurgitation is not visualized.   7. The inferior vena cava is normal in size with greater than 50%  respiratory variability, suggesting right atrial pressure of 3 mmHg.     Neuro/Psych   Anxiety     CVA    GI/Hepatic   Endo/Other    Renal/GU Renal InsufficiencyRenal diseaseCKD stage IIIb; Cr 1.5 08/2022     Musculoskeletal  (+) Arthritis , Osteoarthritis,    Abdominal  (+) + obese  Peds  Hematology H/H 12.9/42.1; PLT 140   Anesthesia Other Findings macular degeneration with vision impairment, remote fistula repair   Reproductive/Obstetrics                             Anesthesia Physical Anesthesia Plan  ASA: 3  Anesthesia Plan: General   Post-op Pain Management: Dilaudid IV and Gabapentin PO (pre-op)*   Induction: Intravenous  PONV Risk Score and Plan: 2 and Ondansetron, Midazolam and Treatment may vary due to age or medical condition  Airway Management Planned: Oral ETT  Additional Equipment:   Intra-op Plan:   Post-operative Plan: Extubation in OR  Informed Consent: I have reviewed the patients History and Physical, chart, labs and discussed the procedure including the risks, benefits and alternatives for the proposed anesthesia with the patient or authorized representative who has indicated his/her understanding and acceptance.     Dental advisory given  Plan Discussed with: CRNA  Anesthesia Plan Comments:        Anesthesia Quick Evaluation

## 2022-08-21 NOTE — Transfer of Care (Signed)
Immediate Anesthesia Transfer of Care Note  Patient: Matthew Mcneil  Procedure(s) Performed: ROBOTIC RESECTION OF COLON- RECTOSIGMOID with intraoperative tissue perfusion/ICG RIGID PROCTOSCOPY CYSTOSCOPY with FIREFLY INJECTION  Patient Location: PACU  Anesthesia Type:General  Level of Consciousness: awake, alert , oriented, and patient cooperative  Airway & Oxygen Therapy: Patient Spontanous Breathing and Patient connected to face mask oxygen  Post-op Assessment: Report given to RN and Post -op Vital signs reviewed and stable  Post vital signs: Reviewed and stable  Last Vitals:  Vitals Value Taken Time  BP 148/67 08/21/22 1121  Temp    Pulse 67 08/21/22 1125  Resp 18 08/21/22 1125  SpO2 100 % 08/21/22 1125  Vitals shown include unvalidated device data.  Last Pain:  Vitals:   08/21/22 0722  TempSrc:   PainSc: 0-No pain         Complications: No notable events documented.

## 2022-08-21 NOTE — Discharge Instructions (Addendum)
SURGERY: POST OP INSTRUCTIONS (Surgery for small bowel obstruction, colon resection, etc)   ######################################################################  EAT Gradually transition to a high fiber diet with a fiber supplement over the next few days after discharge  WALK Walk an hour a day.  Control your pain to do that.    CONTROL PAIN Control pain so that you can walk, sleep, tolerate sneezing/coughing, go up/down stairs.  HAVE A BOWEL MOVEMENT DAILY Keep your bowels regular to avoid problems.  OK to try a laxative to override constipation.  OK to use an antidairrheal to slow down diarrhea.  Call if not better after 2 tries  CALL IF YOU HAVE PROBLEMS/CONCERNS Call if you are still struggling despite following these instructions. Call if you have concerns not answered by these instructions  ######################################################################   DIET Follow a light diet the first few days at home.  Start with a bland diet such as soups, liquids, starchy foods, low fat foods, etc.  If you feel full, bloated, or constipated, stay on a ful liquid or pureed/blenderized diet for a few days until you feel better and no longer constipated. Be sure to drink plenty of fluids every day to avoid getting dehydrated (feeling dizzy, not urinating, etc.). Gradually add a fiber supplement to your diet over the next week.  Gradually get back to a regular solid diet.  Avoid fast food or heavy meals the first week as you are more likely to get nauseated. It is expected for your digestive tract to need a few months to get back to normal.  It is common for your bowel movements and stools to be irregular.  You will have occasional bloating and cramping that should eventually fade away.  Until you are eating solid food normally, off all pain medications, and back to regular activities; your bowels will not be normal. Focus on eating a low-fat, high fiber diet the rest of your life  (See Getting to Good Bowel Health, below).  CARE of your INCISION or WOUND  It is good for closed incisions and even open wounds to be washed every day.  Shower every day.  Short baths are fine.  Wash the incisions and wounds clean with soap & water.    You may leave closed incisions open to air if it is dry.   You may cover the incision with clean gauze & replace it after your daily shower for comfort.  TEGADERM:  You have clear gauze band-aid dressings over your closed incision(s).  Remove the dressings 3 days after surgery.= by 6/15 Saturday    If you have an open wound with a wound vac, see wound vac care instructions.    ACTIVITIES as tolerated Start light daily activities --- self-care, walking, climbing stairs-- beginning the day after surgery.  Gradually increase activities as tolerated.  Control your pain to be active.  Stop when you are tired.  Ideally, walk several times a day, eventually an hour a day.   Most people are back to most day-to-day activities in a few weeks.  It takes 4-8 weeks to get back to unrestricted, intense activity. If you can walk 30 minutes without difficulty, it is safe to try more intense activity such as jogging, treadmill, bicycling, low-impact aerobics, swimming, etc. Save the most intensive and strenuous activity for last (Usually 4-8 weeks after surgery) such as sit-ups, heavy lifting, contact sports, etc.  Refrain from any intense heavy lifting or straining until you are off narcotics for pain control.  You will have  off days, but things should improve week-by-week. DO NOT PUSH THROUGH PAIN.  Let pain be your guide: If it hurts to do something, don't do it.  Pain is your body warning you to avoid that activity for another week until the pain goes down. You may drive when you are no longer taking narcotic prescription pain medication, you can comfortably wear a seatbelt, and you can safely make sudden turns/stops to protect yourself without hesitating due  to pain. You may have sexual intercourse when it is comfortable. If it hurts to do something, stop.   MEDICATIONS Take your usually prescribed home medications unless otherwise directed.   Blood thinners:  You can restart any strong blood thinners after the second postoperative day.  It is OK to continue aspirin before & after surgery..    Some blood in BMs the first 1-2 weeks is common but should taper down & be small volume.  If you are passing many large clots, call your surgeon    PAIN CONTROL Pain after surgery or related to activity is often due to strain/injury to muscle, tendon, nerves and/or incisions.  This pain is usually short-term and will improve in a few months.  To help speed the process of healing and to get back to regular activity more quickly, DO THE FOLLOWING THINGS TOGETHER: Increase activity gradually.  DO NOT PUSH THROUGH PAIN Use Ice and/or Heat Try Gentle Massage and/or Stretching Take over the counter pain medication Take Narcotic prescription pain medication for more severe pain  Good pain control = faster recovery.  It is better to take more medicine to be more active than to stay in bed all day to avoid medications.  Increase activity gradually Avoid heavy lifting at first, then increase to lifting as tolerated over the next 6 weeks. Do not "push through" the pain.  Listen to your body and avoid positions and maneuvers than reproduce the pain.  Wait a few days before trying something more intense Walking an hour a day is encouraged to help your body recover faster and more safely.  Start slowly and stop when getting sore.  If you can walk 30 minutes without stopping or pain, you can try more intense activity (running, jogging, aerobics, cycling, swimming, treadmill, sex, sports, weightlifting, etc.) Remember: If it hurts to do it, then don't do it! Use Ice and/or Heat You will have swelling and bruising around the incisions.  This will take several weeks to  resolve. Ice packs or heating pads (6-8 times a day, 30-60 minutes at a time) will help sooth soreness & bruising. Some people prefer to use ice alone, heat alone, or alternate between ice & heat.  Experiment and see what works best for you.  Consider trying ice for the first few days to help decrease swelling and bruising; then, switch to heat to help relax sore spots and speed recovery. Shower every day.  Short baths are fine.  It feels good!  Keep the incisions and wounds clean with soap & water.   Try Gentle Massage and/or Stretching Massage at the area of pain many times a day Stop if you feel pain - do not overdo it Take over the counter pain medication This helps the muscle and nerve tissues become less irritable and calm down faster Choose ONE of the following over-the-counter anti-inflammatory medications: Acetaminophen 500mg  tabs (Tylenol) 1-2 pills with every meal and just before bedtime (avoid if you have liver problems or if you have acetaminophen in you narcotic prescription)  Naproxen 220mg  tabs (ex. Aleve, Naprosyn) 1-2 pills twice a day (avoid if you have kidney, stomach, IBD, or bleeding problems) Ibuprofen 200mg  tabs (ex. Advil, Motrin) 3-4 pills with every meal and just before bedtime (avoid if you have kidney, stomach, IBD, or bleeding problems) Take with food/snack several times a day as directed for at least 2 weeks to help keep pain / soreness down & more manageable. Take Narcotic prescription pain medication for more severe pain A prescription for strong pain control is often given to you upon discharge (for example: oxycodone/Percocet, hydrocodone/Norco/Vicodin, or tramadol/Ultram) Take your pain medication as prescribed. Be mindful that most narcotic prescriptions contain Tylenol (acetaminophen) as well - avoid taking too much Tylenol. If you are having problems/concerns with the prescription medicine (does not control pain, nausea, vomiting, rash, itching, etc.), please  call us [phone] to see if we need to switch you to a different pain medicine that will work better for you and/or control your side effects better. If you need a refill on your pain medication, you must call the office before 4 pm and on weekdays only.  By federal law, prescriptions for narcotics cannot be called into a pharmacy.  They must be filled out on paper & picked up from our office by the patient or authorized caretaker.  Prescriptions cannot be filled after 4 pm nor on weekends.    WHEN TO CALL us [phone] Severe uncontrolled or worsening pain  Fever over 101 F (38.5 C) Concerns with the incision: Worsening pain, redness, rash/hives, swelling, bleeding, or drainage Reactions / problems with new medications (itching, rash, hives, nausea, etc.) Nausea and/or vomiting Difficulty urinating Difficulty breathing Worsening fatigue, dizziness, lightheadedness, blurred vision Other concerns If you are not getting better after two weeks or are noticing you are getting worse, contact our office [phone] for further advice.  We may need to adjust your medications, re-evaluate you in the office, send you to the emergency room, or see what other things we can do to help. The clinic staff is available to answer your questions during regular business hours (8:30am-5pm).  Please don't hesitate to call and ask to speak to one of our nurses for clinical concerns.    A surgeon from [facility] is always on call at the hospitals 24 hours/day If you have a medical emergency, go to the nearest emergency room or call 911.  FOLLOW UP in our office One the day of your discharge from the hospital (or the next business weekday), please call [facility] to set up or confirm an appointment to see your surgeon in the office for a follow-up appointment.  Usually it is 2-3 weeks after your surgery.   If you have skin staples at your incision(s), let the office know so we can  set up a time in the office for the nurse to remove them (usually around 10 days after surgery). Make sure that you call for appointments the day of discharge (or the next business weekday) from the hospital to ensure a convenient appointment time. IF YOU HAVE DISABILITY OR FAMILY LEAVE FORMS, BRING THEM TO THE OFFICE FOR PROCESSING.  DO NOT GIVE THEM TO YOUR DOCTOR.  [facility] [address], [place], [place]  [address] ? [phone] - Main [phone] - Toll Free,  [phone] - Fax [email or web address]    GETTING TO GOOD BOWEL HEALTH. It is expected for your digestive tract to need a few months to  get back to normal.  It is common for your bowel movements and stools to be irregular.  You will have occasional bloating and cramping that should eventually fade away.  Until you are eating solid food normally, off all pain medications, and back to regular activities; your bowels will not be normal.   Avoiding constipation The goal: ONE SOFT BOWEL MOVEMENT A DAY!    Drink plenty of fluids.  Choose water first. TAKE A FIBER SUPPLEMENT EVERY DAY THE REST OF YOUR LIFE During your first week back home, gradually add back a fiber supplement every day Experiment which form you can tolerate.   There are many forms such as powders, tablets, wafers, gummies, etc Psyllium bran (Metamucil), methylcellulose (Citrucel), Miralax or Glycolax, Benefiber, Flax Seed.  Adjust the dose week-by-week (1/2 dose/day to 6 doses a day) until you are moving your bowels 1-2 times a day.  Cut back the dose or try a different fiber product if it is giving you problems such as diarrhea or bloating. Sometimes a laxative is needed to help jump-start bowels if constipated until the fiber supplement can help regulate your bowels.  If you are tolerating eating & you are farting, it is okay to try a gentle laxative such as double dose MiraLax, prune juice, or Milk of Magnesia.   Avoid using laxatives too often. Stool softeners can sometimes help counteract the constipating effects of narcotic pain medicines.  It can also cause diarrhea, so avoid using for too long. If you are still constipated despite taking fiber daily, eating solids, and a few doses of laxatives, call our office. Controlling diarrhea Try drinking liquids and eating bland foods for a few days to avoid stressing your intestines further. Avoid dairy products (especially milk & ice cream) for a short time.  The intestines often can lose the ability to digest lactose when stressed. Avoid foods that cause gassiness or bloating.  Typical foods include beans and other legumes, cabbage, broccoli, and dairy foods.  Avoid greasy, spicy, fast foods.  Every person has some sensitivity to other foods, so listen to your body and avoid those foods that trigger problems for you. Probiotics (such as active yogurt, Align, etc) may help repopulate the intestines and colon with normal bacteria and calm down a sensitive digestive tract Adding a fiber supplement gradually can help thicken stools by absorbing excess fluid and retrain the intestines to act more normally.  Slowly increase the dose over a few weeks.  Too much fiber too soon can backfire and cause cramping & bloating. It is okay to try and slow down diarrhea with a few doses of antidiarrheal medicines.   Bismuth subsalicylate (ex. Kayopectate, Pepto Bismol) for a few doses can help control diarrhea.  Avoid if pregnant.   Loperamide (Imodium) can slow down diarrhea.  Start with one tablet (2mg ) first.  Avoid if you are having fevers or severe pain.  ILEOSTOMY PATIENTS WILL HAVE CHRONIC DIARRHEA since their colon is not in use.    Drink plenty of liquids.  You will need to drink even more glasses of water/liquid a day to avoid getting dehydrated. Record output from your ileostomy.  Expect to empty the bag every 3-4 hours at first.  Most people with a permanent ileostomy  empty their bag 4-6 times at the least.   Use antidiarrheal medicine (especially Imodium) several times a day to avoid getting dehydrated.  Start with a dose at bedtime & breakfast.  Adjust up or down as needed.  Increase  antidiarrheal medications as directed to avoid emptying the bag more than 8 times a day (every 3 hours). Work with your wound ostomy nurse to learn care for your ostomy.  See ostomy care instructions. TROUBLESHOOTING IRREGULAR BOWELS 1) Start with a soft & bland diet. No spicy, greasy, or fried foods.  2) Avoid gluten/wheat or dairy products from diet to see if symptoms improve. 3) Miralax 17gm or flax seed mixed in 8oz. water or juice-daily. May use 2-4 times a day as needed. 4) Gas-X, Phazyme, etc. as needed for gas & bloating.  5) Prilosec (omeprazole) over-the-counter as needed 6)  Consider probiotics (Align, Activa, etc) to help calm the bowels down  Call your doctor if you are getting worse or not getting better.  Sometimes further testing (cultures, endoscopy, X-ray studies, CT scans, bloodwork, etc.) may be needed to help diagnose and treat the cause of the diarrhea. [facility] [address], [place], [place]  [address] [phone] - Main.    [phone]  - Toll Free.   [phone] - Fax [email or web address]

## 2022-08-21 NOTE — Progress Notes (Signed)
Checked on patients progress at approximately 1430. His gown sheet and bedspread were saturated with blood. An Incision on right side of abdomen was dripping. I removed saturated dressing and replaced with a pressure dressing. NT and I changed linens and gown.

## 2022-08-21 NOTE — Progress Notes (Signed)
RN flushed foley catheter and got several large clots out. Urine flow is normal at this time. Informed patient that we may need to do CBI if leakage or clots return.

## 2022-08-22 ENCOUNTER — Encounter (HOSPITAL_COMMUNITY): Payer: Self-pay | Admitting: Surgery

## 2022-08-22 LAB — CBC
HCT: 33.1 % — ABNORMAL LOW (ref 39.0–52.0)
Hemoglobin: 10.2 g/dL — ABNORMAL LOW (ref 13.0–17.0)
MCH: 29.3 pg (ref 26.0–34.0)
MCHC: 30.8 g/dL (ref 30.0–36.0)
MCV: 95.1 fL (ref 80.0–100.0)
Platelets: 103 10*3/uL — ABNORMAL LOW (ref 150–400)
RBC: 3.48 MIL/uL — ABNORMAL LOW (ref 4.22–5.81)
RDW: 15.7 % — ABNORMAL HIGH (ref 11.5–15.5)
WBC: 10.1 10*3/uL (ref 4.0–10.5)
nRBC: 0 % (ref 0.0–0.2)

## 2022-08-22 LAB — BASIC METABOLIC PANEL
Anion gap: 8 (ref 5–15)
BUN: 24 mg/dL — ABNORMAL HIGH (ref 8–23)
CO2: 24 mmol/L (ref 22–32)
Calcium: 9.1 mg/dL (ref 8.9–10.3)
Chloride: 107 mmol/L (ref 98–111)
Creatinine, Ser: 1.98 mg/dL — ABNORMAL HIGH (ref 0.61–1.24)
GFR, Estimated: 33 mL/min — ABNORMAL LOW (ref >60)
Glucose, Bld: 115 mg/dL — ABNORMAL HIGH (ref 70–99)
Potassium: 4 mmol/L (ref 3.5–5.1)
Sodium: 139 mmol/L (ref 135–145)

## 2022-08-22 LAB — MAGNESIUM: Magnesium: 1.9 mg/dL (ref 1.7–2.4)

## 2022-08-22 NOTE — Evaluation (Addendum)
Occupational Therapy Evaluation Patient Details Name: Matthew Mcneil MRN: [id number] DOB: April 11, 1940 Today's Date: 08/22/2022   History of Present Illness 82 y.o. 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 PMH: CKD, glaucoma, CVA, aortic valve replacement.   Clinical Impression   The pt performed all assessed tasks without the need for assistance, including bed mobility, lower body dressing, and ambulation. All needed post-op education and recommendations were provided during the session, and the pt and his family presented with good recall and understanding. The pt does not require further OT services. OT will sign off and recommend he return home with family at discharge.       Recommendations for follow up therapy are one component of a multi-disciplinary discharge planning process, led by the attending physician.  Recommendations may be updated based on patient status, additional functional criteria and insurance authorization.   Assistance Recommended at Discharge PRN  Patient can return home with the following Assistance with cooking/housework;Assist for transportation;Direct supervision/assist for medications management    Functional Status Assessment  Patient has not had a recent decline in their functional status  Equipment Recommendations  None recommended by OT       Precautions / Restrictions Precautions Precautions: Fall;Other (comment) Precaution Comments: abdominal surgery; reviewed log roll technique for bed mobility; legally blind Restrictions Weight Bearing Restrictions: No      Mobility Bed Mobility Overal bed mobility: Modified Independent; Supine to Sit     Transfers Overall transfer level: Needs assistance Equipment used: None Transfers: Sit to/from Stand Sit to Stand: Supervision                  Balance Overall balance assessment: Mild deficits observed, not formally tested         ADL either  performed or assessed with clinical judgement   ADL Overall ADL's : Needs assistance/impaired Eating/Feeding: Independent;Sitting Eating/Feeding Details (indicate cue type and reason): based on clinical judgement Grooming: Set up;Standing Grooming Details (indicate cue type and reason): at sink level         Upper Body Dressing : Set up;Sitting Upper Body Dressing Details (indicate cue type and reason): simulated seated EOB Lower Body Dressing: Set up Lower Body Dressing Details (indicate cue type and reason): OT instructed the pt on implementing the figure four technique vs. using a reacher to perform lower body dressing, given recent surgery and abdominal incision. Toilet Transfer: Supervision/safety;Ambulation Toilet Transfer Details (indicate cue type and reason): at bathroom level                 Vision Baseline Vision/History: 2 Legally blind              Pertinent Vitals/Pain Pain Assessment Pain Assessment: No/denies pain     Hand Dominance Right   Extremity/Trunk Assessment Upper Extremity Assessment Upper Extremity Assessment: Defer to OT evaluation   Lower Extremity Assessment Lower Extremity Assessment: RLE deficits/detail;LLE deficits/detail RLE Deficits / Details: knee ext 5/5 RLE Sensation: decreased light touch;history of peripheral neuropathy RLE Coordination: WNL LLE Deficits / Details: knee ext 5/5 LLE Sensation: decreased light touch;history of peripheral neuropathy LLE Coordination: WNL   Cervical / Trunk Assessment Cervical / Trunk Assessment: Normal   Communication Communication Communication: No difficulties   Cognition Arousal/Alertness: Awake/alert Behavior During Therapy: WFL for tasks assessed/performed Overall Cognitive Status: Within Functional Limits for tasks assessed        General Comments: friendly, able to follow commands without difficulty, oriented x4  Home Living Family/patient expects to be  discharged to:: Private residence Living Arrangements: Spouse/significant other Available Help at Discharge: Family Type of Home: House Home Access: Level entry     Home Layout: One level     Bathroom Shower/Tub: Runner, broadcasting/film/video: Shower seat          Prior Functioning/Environment Prior Level of Function : Independent/Modified Independent             Mobility Comments: He was independent with ambulation. ADLs Comments: He was independent with ADLs & shared household cleaning with his spouse. 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              OT Treatment/Interventions:   No further OT treatment needs identified       OT Frequency:  N/A       AM-PAC OT "6 Clicks" Daily Activity     Outcome Measure Help from another person eating meals?: None Help from another person taking care of personal grooming?: None Help from another person toileting, which includes using toliet, bedpan, or urinal?: None Help from another person bathing (including washing, rinsing, drying)?: A Little Help from another person to put on and taking off regular upper body clothing?: None Help from another person to put on and taking off regular lower body clothing?: None 6 Click Score: 23   End of Session Equipment Utilized During Treatment: Gait belt Nurse Communication: Mobility status  Activity Tolerance: Patient tolerated treatment well Patient left: in chair;with call bell/phone within reach;with family/visitor present  OT Visit Diagnosis: Muscle weakness (generalized) (M62.81)                Time: 0945-1000 OT Time Calculation (min): 15 min Charges:  OT General Charges $OT Visit: 1 Visit OT Evaluation $OT Eval Low Complexity: 1 Low    Essance Gatti L Sultana Tierney, OTR/L 08/22/2022, 10:51 AM

## 2022-08-22 NOTE — Progress Notes (Signed)
Pharmacy Brief Note - Alvimopan (Entereg)  The standing order set for alvimopan (Entereg) now includes an automatic order to discontinue the drug after the patient has had a bowel movement. The change was approved by the Pharmacy & Therapeutics Committee and the Medical Executive Committee.   This patient has had bowel movements documented by nursing. Therefore, alvimopan has been discontinued. If there are questions, please contact the pharmacy at [phone].   Thank you-  Adalberto Cole, PharmD, BCPS 08/22/2022 11:39 AM

## 2022-08-22 NOTE — TOC CM/SW Note (Signed)
Transition of Care [facility]) - Inpatient Brief Assessment  Patient Details  Name: Naftoli Penny MRN: [id number] Date of Birth: 02-16-1941  [facility]) CM/SW Contact:    Ewing Schlein, LCSW Phone Number: 08/22/2022, 12:16 PM  Clinical Narrative: Screening completed. No TOC needs identified as there are no PT/OT recommendations for follow up or DME.  Transition of Care Asessment: Insurance and Status: Insurance coverage has been reviewed Patient has primary care physician: Yes Home environment has been reviewed: Resides at home with spouse Prior level of function:: Independent at baseline Prior/Current Home Services: No current home services Social Determinants of Health Reivew: SDOH reviewed no interventions necessary Readmission risk has been reviewed: Yes Transition of care needs: no transition of care needs at this time

## 2022-08-22 NOTE — Progress Notes (Signed)
08/22/2022  Saundra Shelling [id number] 03/11/1941  CARE TEAM: PCP: Gordan Payment., MD  Outpatient Care Team: Patient Care Team: Gordan Payment., MD as PCP - General (Internal Medicine) Dulce Sellar Iline Oven, MD as PCP - Cardiology (Cardiology) Karie Soda, MD as Consulting Physician (Colon and Rectal Surgery) Jannifer Hick, MD as Consulting Physician (Urology) Lynann Bologna, MD as Consulting Physician (Gastroenterology) Darleene Cleaver, MD as Consulting Physician (General Surgery)  Inpatient Treatment Team: Treatment Team: Attending Provider: Karie Soda, MD; Consulting Physician: Despina Arias, MD; Registered Nurse: Renard Hamper, RN; Technician: Margaretmary Eddy, NT; Charge Nurse: Amil Amen, RN; Physical Therapist: Alvester Morin, PT; Technician: Vella Raring, NT; Respiratory Therapist: Nunzio Cobbs, RRT; Technician: Arturo Morton, NT; Pharmacist: Adalberto Cole, [facility]; Utilization Review: Helyn Numbers, RN   Problem List:   Principal Problem:   Colovesical fistula Active Problems:   Anxiety   Benign prostate hyperplasia   Chronic idiopathic gout   Stage 3a chronic kidney disease (HCC)   Obesity   Essential hypertension   Hyperlipidemia   S/P TAVR (transcatheter aortic valve replacement)   History of diverticulitis of colon   Glaucoma   History of kidney stones   08/21/2022  POST-OPERATIVE DIAGNOSIS:  COLOVESICAL FISTULA   PROCEDURE:   LOW ANTERIOR RECTOSIGMOID RESECTION INTRAOPERATIVE ASSESSMENT OF TISSUE VASCULAR PERFUSION USING ICG (indocyanine green) IMMUNOFLUORESCENCE TRANSVERSUS ABDOMINIS PLANE (TAP) BLOCK - BILATERAL   SURGEON:  Ardeth Sportsman, MD  OR FINDINGS:    Patient had thickened rectosigmoid with adhesions to the left inferolateral dome of the bladder at site of suspected colovesical fistula. No obvious metastatic disease on visceral parietal peritoneum or liver.   It is a 29mm EEA anastomosis ( distal  descending colon connected to proximal rectum.)  It rests 13 cm from the anal verge by rigid proctoscopy.   CASE DATA: Type of patient?: Elective [facility] Private Case Status of Case? Elective Scheduled Infection Present At Time Of Surgery (PATOS)?  ABSCESS  Assessment [facility] Stay = 1 days) 1 Day Post-Op    Recovering well so far    Plan:  -ERAs protocol -f/u pathology  -This process hematuria noted last night appears to be resolved and looks clear.  Remove Foley.  Urology had no disagreements on that. -Hemoglobin mild drop.  No active oozing or bleeding.  Holding enoxaparin x 24 hours and regroup -Advance diet.  Resolving rather quickly. -Elevated creatinine but already has baseline kidney disease.  Nonoliguric.  Follow.  Try and keep on the dry side given his advanced age but IV fluid boluses for backup if becomes oliguric.  Hopefully not too likely.  We will see. -Hypertension control -Gout control -VTE prophylaxis- SCDs, etc -mobilize as tolerated to help recovery -Disposition:  Disposition:  The patient is from: Home  Anticipate discharge to:  Home  Anticipated Date of Discharge is:  June 14,2024   Barriers to discharge:  Pending Clinical improvement (more likely than not)  Patient currently is NOT MEDICALLY STABLE for discharge from the hospital from a surgery standpoint.      I reviewed nursing notes, Consultant urology notes, last 24 h vitals and pain scores, last 48 h intake and output, last 24 h labs and trends, and last 24 h imaging results.  I have reviewed this patient's available data, including medical history, events of note, test results, etc as part of my evaluation.   A significant portion of that time was spent in counseling. Care during the described time interval was provided  by me.  This care required moderate level of medical decision making.  08/22/2022    Subjective: (Chief complaint)  Patient with hematuria and some oozing last night.   Cleared up after a few flushes of clots.  Urine light yellow clear with no more clots this morning.  Patient denies much pain.  Walking around the room.  Tolerated clears and dysphagia 1 diet.  Wife in room.  Objective:  Vital signs:  Vitals:   08/21/22 2024 08/22/22 0208 08/22/22 0500 08/22/22 0512  BP: (!) 112/58 (!) 114/49  (!) 109/45  Pulse: 73 68  66  Resp: 18 18  19   Temp: 98.2 F (36.8 C) 98.4 F (36.9 C)  98.4 F (36.9 C)  TempSrc: Oral Oral  Oral  SpO2: 100% 97%  95%  Weight:   89.8 kg   Height:        Last BM Date : 08/21/22  Intake/Output   Yesterday:  06/12 0701 - 06/13 0700 In: 3368.5 [P.O.:1260; I.V.:2008.5; IV Piggyback:100] Out: 1750 [Urine:1650; Blood:100] This shift:  No intake/output data recorded.  Bowel function:  Flatus: YES  BM:  YES  Drain: Coley catheter with a few clots in tubing but collection clear light yellow urine   Physical Exam:  General: Pt awake/alert in no acute distress Eyes: PERRL, normal EOM.  Sclera clear.  No icterus Neuro: CN II-XII intact w/o focal sensory/motor deficits. Lymph: No head/neck/groin lymphadenopathy Psych:  No delerium/psychosis/paranoia.  Oriented x 4 HENT: Normocephalic, Mucus membranes moist.  No thrush Neck: Supple, No tracheal deviation.  No obvious thyromegaly Chest: No pain to chest wall compression.  Good respiratory excursion.  No audible wheezing CV:  Pulses intact.  Regular rhythm.  No major extremity edema MS: Normal AROM mjr joints.  No obvious deformity  Abdomen: Soft.  Nondistended.  Nontender.  Old blood in right lower quadrant port site but no active bleeding now.  No evidence of peritonitis.  No incarcerated hernias.  Ext:  No deformity.  No mjr edema.  No cyanosis Skin: No petechiae / purpurea.  No major sores.  Warm and dry    Results:   Cultures: No results found for this or any previous visit (from the past 720 hour(s)).  Labs: Results for orders placed or performed  during the hospital encounter of 08/21/22 (from the past 48 hour(s))  Hemoglobin A1c     Status: None   Collection Time: 08/21/22 12:14 PM  Result Value Ref Range   Hgb A1c MFr Bld 5.4 4.8 - 5.6 %    Comment: (NOTE) Pre diabetes:          5.7%-6.4%  Diabetes:              >6.4%  Glycemic control for   <7.0% adults with diabetes    Mean Plasma Glucose 108.28 mg/dL    Comment: Performed at [facility], [address], [place], [place] [address]  Basic metabolic panel     Status: Abnormal   Collection Time: 08/22/22  5:10 AM  Result Value Ref Range   Sodium 139 135 - 145 mmol/L   Potassium 4.0 3.5 - 5.1 mmol/L   Chloride 107 98 - 111 mmol/L   CO2 24 22 - 32 mmol/L   Glucose, Bld 115 (H) 70 - 99 mg/dL    Comment: Glucose reference range applies only to samples taken after fasting for at least 8 hours.   BUN 24 (H) 8 - 23 mg/dL   Creatinine, Ser 6.04 (  H) 0.61 - 1.24 mg/dL   Calcium 9.1 8.9 - 91.4 mg/dL   GFR, Estimated 33 (L) >60 mL/min    Comment: (NOTE) Calculated using the CKD-EPI Creatinine Equation (2021)    Anion gap 8 5 - 15    Comment: Performed at [facility], [address], [place], [place] [address]  CBC     Status: Abnormal   Collection Time: 08/22/22  5:10 AM  Result Value Ref Range   WBC 10.1 4.0 - 10.5 K/uL   RBC 3.48 (L) 4.22 - 5.81 MIL/uL   Hemoglobin 10.2 (L) 13.0 - 17.0 g/dL   HCT 62.1 (L) 30.8 - 65.7 %   MCV 95.1 80.0 - 100.0 fL   MCH 29.3 26.0 - 34.0 pg   MCHC 30.8 30.0 - 36.0 g/dL   RDW 84.6 (H) 96.2 - 95.2 %   Platelets 103 (L) 150 - 400 K/uL   nRBC 0.0 0.0 - 0.2 %    Comment: Performed at [facility], [address], [place], [place] [address]  Magnesium     Status: None   Collection Time: 08/22/22  5:10 AM  Result Value Ref Range   Magnesium 1.9 1.7 - 2.4 mg/dL    Comment: Performed at [facility], [address], [place], [place] [address]    Imaging / Studies: No results  found.  Medications / Allergies: per chart  Antibiotics: Anti-infectives (From admission, onward)    Start     Dose/Rate Route Frequency Ordered Stop   08/21/22 2100  cefoTEtan (CEFOTAN) 2 g in sodium chloride 0.9 % 100 mL IVPB        2 g 200 mL/hr over 30 Minutes Intravenous Every 12 hours 08/21/22 1230 08/21/22 2140   08/21/22 1400  neomycin (MYCIFRADIN) tablet 1,000 mg  Status:  Discontinued       See Hyperspace for full Linked Orders Report.   1,000 mg Oral 3 times per day 08/21/22 0628 08/21/22 0635   08/21/22 1400  metroNIDAZOLE (FLAGYL) tablet 1,000 mg  Status:  Discontinued       See Hyperspace for full Linked Orders Report.   1,000 mg Oral 3 times per day 08/21/22 0628 08/21/22 0635   08/21/22 0630  cefoTEtan (CEFOTAN) 2 g in sodium chloride 0.9 % 100 mL IVPB        2 g 200 mL/hr over 30 Minutes Intravenous On call to O.R. 08/21/22 2725 08/21/22 0905         Note: Portions of this report may have been transcribed using voice recognition software. Every effort was made to ensure accuracy; however, inadvertent computerized transcription errors may be present.   Any transcriptional errors that result from this process are unintentional.    Ardeth Sportsman, MD, FACS, MASCRS Esophageal, Gastrointestinal & Colorectal Surgery Robotic and Minimally Invasive Surgery  [facility] [address] [place], [place] [address] [phone] Fax [phone] Main  CONTACT INFORMATION:  Weekday (9AM-5PM): Call [facility] main office at [phone]  Weeknight (5PM-9AM) or Weekend/Holiday: Check [email or web address] (password " [email or web address]") for General Surgery [facility] coverage  (Please, do not use SecureChat as it is not reliable communication to reach operating surgeons for immediate patient care given surgeries/outpatient duties/clinic/cross-coverage/off post-call which would lead to a delay in care.  Epic staff messaging available for  outptient concerns, but may not be answered for 48 hours or more).     08/22/2022  7:51 AM

## 2022-08-22 NOTE — Progress Notes (Signed)
1 Day Post-Op Subjective: Dr. Lafonda Mosses assisted colorectal surgery with cystoscopy and bilateral ureteral catheterization for firefly injection during today's assessment of colovesical fistula.  No acute events overnight.  First time meeting family this morning.  Patient reported clearing hematuria.  Catheter has been out for several hours on rounds but he has not voided yet  Objective: Vital signs in last 24 hours: Temp:  [97.5 F (36.4 C)-98.4 F (36.9 C)] 98.1 F (36.7 C) (06/13 0939) Pulse Rate:  [66-74] 72 (06/13 0939) Resp:  [16-19] 18 (06/13 0939) BP: (109-133)/(45-64) 114/51 (06/13 0939) SpO2:  [95 %-100 %] 100 % (06/13 0939) Weight:  [89.8 kg] 89.8 kg (06/13 0500)  Intake/Output from previous day: 06/12 0701 - 06/13 0700 In: 3368.5 [P.O.:1260; I.V.:2008.5; IV Piggyback:100] Out: 1750 [Urine:1650; Blood:100]  Intake/Output this shift: Total I/O In: 360 [P.O.:360] Out: 0   Physical Exam:  General: Alert and oriented CV: No cyanosis Lungs: equal chest rise Abdomen: Soft, NTND, no rebound or guarding Skin: Gu: catheter removed. Resolving hematuria  Lab Results: Recent Labs    08/22/22 0510  HGB 10.2*  HCT 33.1*   BMET Recent Labs    08/22/22 0510  NA 139  K 4.0  CL 107  CO2 24  GLUCOSE 115*  BUN 24*  CREATININE 1.98*  CALCIUM 9.1     Studies/Results: No results found.  Assessment/Plan: #Hematuria  Postoperative hematuria, clot obstruction of urine, and bladder spasm.  Patient was able to be successfully hand irrigated by nursing and urine has largely cleared by this morning.  Foley catheter was removed with close monitoring for urinary retention.  The patient voids on his own will be clear from a urological perspective.  Call with questions   LOS: 1 day   Elmon Kirschner, NP [facility] Pager: [phone]  08/22/2022, 1:11 PM

## 2022-08-22 NOTE — Evaluation (Signed)
Physical Therapy Evaluation Patient Details Name: Ellis Cowell MRN: [id number] DOB: [birth year] Today's Date: 08/22/2022  History of Present Illness  82 y.o. 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 PMH: CKD, glaucoma, CVA, aortic valve replacement.  Clinical Impression  Pt is mobilizing independently, he ambulated 260' without an assistive device, no loss of balance. He denied pain at rest and with activity. No further PT indicated, will sign off. Encouraged pt to ambulate in halls at least TID.        Recommendations for follow up therapy are one component of a multi-disciplinary discharge planning process, led by the attending physician.  Recommendations may be updated based on patient status, additional functional criteria and insurance authorization.  Follow Up Recommendations       Assistance Recommended at Discharge PRN  Patient can return home with the following  Assistance with cooking/housework    Equipment Recommendations None recommended by PT  Recommendations for Other Services       Functional Status Assessment Patient has not had a recent decline in their functional status     Precautions / Restrictions Precautions Precautions: Fall;Other (comment) Precaution Comments: abdominal surgery; reviewed log roll technique for bed mobility; legally blind Restrictions Weight Bearing Restrictions: No      Mobility  Bed Mobility               General bed mobility comments: up in recliner    Transfers Overall transfer level: Independent Equipment used: None Transfers: Sit to/from Stand Sit to Stand: Independent                Ambulation/Gait Ambulation/Gait assistance: Independent Gait Distance (Feet): 260 Feet Assistive device: None Gait Pattern/deviations: WFL(Within Functional Limits) Gait velocity: WNL     General Gait Details: steady, no loss of balance  Stairs            Wheelchair  Mobility    Modified Rankin (Stroke Patients Only)       Balance Overall balance assessment: No apparent balance deficits (not formally assessed)                                           Pertinent Vitals/Pain Pain Assessment Pain Assessment: No/denies pain    Home Living Family/patient expects to be discharged to:: Private residence Living Arrangements: Spouse/significant other Available Help at Discharge: Family Type of Home: House Home Access: Level entry       Home Layout: One level Home Equipment: Shower seat      Prior Function Prior Level of Function : Independent/Modified Independent             Mobility Comments: He was independent with ambulation. ADLs Comments: He was independent with ADLs & shared household cleaning with his spouse. 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     Hand Dominance   Dominant Hand: Right    Extremity/Trunk Assessment   Upper Extremity Assessment Upper Extremity Assessment: Defer to OT evaluation    Lower Extremity Assessment Lower Extremity Assessment: RLE deficits/detail;LLE deficits/detail RLE Deficits / Details: knee ext 5/5 RLE Sensation: decreased light touch;history of peripheral neuropathy RLE Coordination: WNL LLE Deficits / Details: knee ext 5/5 LLE Sensation: decreased light touch;history of peripheral neuropathy LLE Coordination: WNL    Cervical / Trunk Assessment Cervical / Trunk Assessment: Normal  Communication   Communication: No difficulties  Cognition Arousal/Alertness: Awake/alert Behavior  During Therapy: WFL for tasks assessed/performed Overall Cognitive Status: Within Functional Limits for tasks assessed                                 General Comments: friendly, able to follow commands without difficulty, oriented x4        General Comments      Exercises     Assessment/Plan    PT Assessment Patient does not need any further PT services   PT Problem List         PT Treatment Interventions      PT Goals (Current goals can be found in the Care Plan section)  Acute Rehab PT Goals PT Goal Formulation: All assessment and education complete, DC therapy    Frequency       Co-evaluation               AM-PAC PT "6 Clicks" Mobility  Outcome Measure Help needed turning from your back to your side while in a flat bed without using bedrails?: None Help needed moving from lying on your back to sitting on the side of a flat bed without using bedrails?: None Help needed moving to and from a bed to a chair (including a wheelchair)?: None Help needed standing up from a chair using your arms (e.g., wheelchair or bedside chair)?: None Help needed to walk in hospital room?: None Help needed climbing 3-5 steps with a railing? : None 6 Click Score: 24    End of Session   Activity Tolerance: Patient tolerated treatment well Patient left: in chair;with call bell/phone within reach;with family/visitor present Nurse Communication: Mobility status      Time: 1011-1019 PT Time Calculation (min) (ACUTE ONLY): 8 min   Charges:   PT Evaluation $PT Eval Low Complexity: 1 Low         Tamala Ser PT 08/22/2022  Acute Rehabilitation Services  Office [phone]

## 2022-08-23 LAB — CREATININE, SERUM
Creatinine, Ser: 1.9 mg/dL — ABNORMAL HIGH (ref 0.61–1.24)
GFR, Estimated: 35 mL/min — ABNORMAL LOW (ref >60)

## 2022-08-23 LAB — HEMOGLOBIN: Hemoglobin: 10 g/dL — ABNORMAL LOW (ref 13.0–17.0)

## 2022-08-23 LAB — POTASSIUM: Potassium: 4.3 mmol/L (ref 3.5–5.1)

## 2022-08-23 MED ORDER — TRAMADOL HCL 50 MG PO TABS: 50.0000 mg | ORAL_TABLET | Freq: Four times a day (QID) | ORAL | 0 refills | Status: AC | PRN

## 2022-08-23 NOTE — Plan of Care (Signed)

## 2022-08-23 NOTE — Progress Notes (Signed)
Reviewed written d/c instructions w pt and his wife, all questions answered. They both verbalized understanding. D/C via w/c w all belongings in stable condiion

## 2022-08-23 NOTE — Discharge Summary (Signed)
Physician Discharge Summary    Patient ID: Rendall Fucile MRN: [id number] DOB/AGE: 1940-10-05  82 y.o.  Patient Care Team: Gordan Payment., MD as PCP - General (Internal Medicine) Dulce Sellar Iline Oven, MD as PCP - Cardiology (Cardiology) Karie Soda, MD as Consulting Physician (Colon and Rectal Surgery) Jannifer Hick, MD as Consulting Physician (Urology) Lynann Bologna, MD as Consulting Physician (Gastroenterology) Darleene Cleaver, MD as Consulting Physician (General Surgery)  Admit date: 08/21/2022  Discharge date: 08/23/2022  Hospital Stay = 2 days    Discharge Diagnoses:  Principal Problem:   Colovesical fistula Active Problems:   Anxiety   Benign prostate hyperplasia   Chronic idiopathic gout   Stage 3a chronic kidney disease (HCC)   Obesity   Essential hypertension   Hyperlipidemia   S/P TAVR (transcatheter aortic valve replacement)   History of diverticulitis of colon   Glaucoma   History of kidney stones   2 Days Post-Op  08/21/2022  POST-OPERATIVE DIAGNOSIS:  COLOVESICAL FISTULA   PROCEDURE:   LOW ANTERIOR RECTOSIGMOID RESECTION INTRAOPERATIVE ASSESSMENT OF TISSUE VASCULAR PERFUSION USING ICG (indocyanine green) IMMUNOFLUORESCENCE TRANSVERSUS ABDOMINIS PLANE (TAP) BLOCK - BILATERAL   SURGEON:  Ardeth Sportsman, MD   OR FINDINGS:    Patient had thickened rectosigmoid with adhesions to the left inferolateral dome of the bladder at site of suspected colovesical fistula. No obvious metastatic disease on visceral parietal peritoneum or liver.   It is a 29mm EEA anastomosis ( distal descending colon connected to proximal rectum.)  It rests 13 cm from the anal verge by rigid proctoscopy.   CASE DATA: Type of patient?: Elective [facility] Private Case Status of Case? Elective Scheduled Infection Present At Time Of Surgery (PATOS)?  ABSCESS   #############################################################################  Preoperative diagnosis:  1. Colovesical  fistula   Postoperative diagnosis: 1. same   Procedure(s): 1. Cystoscopy with bilateral ureteral catheterization for firefly injection   Surgeon: Dr. Irine Seal  Intraoperative findings: some irritation but no obvious fistula      Consults: Case Management / Social Work, Physical Therapy, Occupational Therapy, Pharmacy, Nutrition, Anesthesia, and Urology  Hospital Course:   The patient underwent the surgery above.  Postoperatively, the patient gradually mobilized and advanced to a solid diet.  Patient did have some hematuria the first night but after bladder flushings clots resolved and was clearing light yellow postop day 1.  Followed by urology who agreed things were improved.  Pain and other symptoms were treated aggressively.    By the time of discharge, the patient was walking well the hallways, eating food, having flatus.  Pain was well-controlled on an oral medications.  Based on meeting discharge criteria and continuing to recover, I felt it was safe for the patient to be discharged from the hospital to further recover with close followup.   Postoperative recommendations were discussed in detail to the patient and his wife.  They are written as well.  Discharged Condition: good  Discharge Exam: Blood pressure 136/66, pulse 83, temperature 98.3 F (36.8 C), temperature source Oral, resp. rate 18, height 5\' 6"  (1.676 m), weight 91.2 kg, SpO2 95 %.  General: Pt awake/alert/oriented x4 in No acute distress Eyes: PERRL, normal EOM.  Sclera clear.  No icterus Neuro: CN II-XII intact w/o focal sensory/motor deficits. Lymph: No head/neck/groin lymphadenopathy Psych:  No delerium/psychosis/paranoia HENT: Normocephalic, Mucus membranes moist.  No thrush Neck: Supple, No tracheal deviation Chest:  No chest wall pain w good excursion CV:  Pulses intact.  Regular rhythm MS: Normal AROM  mjr joints.  No obvious deformity Abdomen: Soft.  Nondistended.  Nontender.  No evidence of  peritonitis.  No incarcerated hernias. Ext:  SCDs BLE.  No mjr edema.  No cyanosis Skin: No petechiae / purpura   Disposition:    Follow-up Information     Karie Soda, MD Follow up on 09/16/2022.   Specialties: General Surgery, Colon and Rectal Surgery Why: To follow up after your operation Contact information: [address] [place] [address] [phone]                 Discharge disposition: 01-Home or Self Care       Discharge Instructions     Call MD for:   Complete by: As directed    FEVER > 101.5 F  (temperatures < 101.5 F are not significant)   Call MD for:  extreme fatigue   Complete by: As directed    Call MD for:  persistant dizziness or light-headedness   Complete by: As directed    Call MD for:  persistant nausea and vomiting   Complete by: As directed    Call MD for:  redness, tenderness, or signs of infection (pain, swelling, redness, odor or green/yellow discharge around incision site)   Complete by: As directed    Call MD for:  severe uncontrolled pain   Complete by: As directed    Diet - low sodium heart healthy   Complete by: As directed    Start with a bland diet such as soups, liquids, starchy foods, low fat foods, etc. the first few days at home. Gradually advance to a solid, low-fat, high fiber diet by the end of the first week at home.   Add a fiber supplement to your diet (Metamucil, etc) If you feel full, bloated, or constipated, stay on a full liquid or pureed/blenderized diet for a few days until you feel better and are no longer constipated.   Discharge instructions   Complete by: As directed    See Discharge Instructions If you are not getting better after two weeks or are noticing you are getting worse, contact our office [phone] for further advice.  We may need to adjust your medications, re-evaluate you in the office, send you to the emergency room, or see what other things we can do to help. The  clinic staff is available to answer your questions during regular business hours (8:30am-5pm).  Please don't hesitate to call and ask to speak to one of our nurses for clinical concerns.    A surgeon from [facility] is always on call at the hospitals 24 hours/day If you have a medical emergency, go to the nearest emergency room or call 911.   Discharge wound care:   Complete by: As directed    It is good for closed incisions and even open wounds to be washed every day.  Shower every day.  Short baths are fine.  Wash the incisions and wounds clean with soap & water.    You may leave closed incisions open to air if it is dry.   You may cover the incision with clean gauze & replace it after your daily shower for comfort.  OK to bathe/swim next week = 6/17 onwards   Driving Restrictions   Complete by: As directed    You may drive when: - you are no longer taking narcotic prescription pain medication - you can comfortably wear a seatbelt - you can safely make sudden turns/stops  without pain.   Increase activity slowly   Complete by: As directed    Start light daily activities --- self-care, walking, climbing stairs- beginning the day after surgery.  Gradually increase activities as tolerated.  Control your pain to be active.  Stop when you are tired.  Ideally, walk several times a day, eventually an hour a day.   Most people are back to most day-to-day activities in a few weeks.  It takes 4-6 weeks to get back to unrestricted, intense activity. If you can walk 30 minutes without difficulty, it is safe to try more intense activity such as jogging, treadmill, bicycling, low-impact aerobics, swimming, etc. Save the most intensive and strenuous activity for last (Usually 4-8 weeks after surgery) such as sit-ups, heavy lifting, contact sports, etc.  Refrain from any intense heavy lifting or straining until you are off narcotics for pain control.  You will have off days, but things should  improve week-by-week. DO NOT PUSH THROUGH PAIN.  Let pain be your guide: If it hurts to do something, don't do it.   Lifting restrictions   Complete by: As directed    If you can walk 30 minutes without difficulty, it is safe to try more intense activity such as jogging, treadmill, bicycling, low-impact aerobics, swimming, etc. Save the most intensive and strenuous activity for last (Usually 4-8 weeks after surgery) such as sit-ups, heavy lifting, contact sports, etc.   Refrain from any intense heavy lifting or straining until you are off narcotics for pain control.  You will have off days, but things should improve week-by-week. DO NOT PUSH THROUGH PAIN.  Let pain be your guide: If it hurts to do something, don't do it.  Pain is your body warning you to avoid that activity for another week until the pain goes down.   May shower / Bathe   Complete by: As directed    May walk up steps   Complete by: As directed    Remove dressing in 72 hours   Complete by: As directed    Make sure all dressings are removed by the third day after surgery.  Leave incisions open to air.  OK to cover incisions with gauze or bandages as desired   Sexual Activity Restrictions   Complete by: As directed    You may have sexual intercourse when it is comfortable. If it hurts to do something, stop.       Allergies as of 08/23/2022       Reactions   Aldactone [spironolactone] Other (See Comments)   Patient was in a panic   Statins Other (See Comments)   Myalgia        Medication List     TAKE these medications    acetaminophen 500 MG tablet Commonly known as: TYLENOL Take 500-1,000 mg by mouth every 6 (six) hours as needed for moderate pain.   allopurinol 100 MG tablet Commonly known as: ZYLOPRIM Take 100 mg by mouth 2 (two) times daily.   amLODipine 5 MG tablet Commonly known as: NORVASC Take 1 tablet (5 mg total) by mouth daily.   aspirin EC 81 MG tablet Take 1 tablet (81 mg total) by mouth  daily. What changed: when to take this   cyanocobalamin 500 MCG tablet Commonly known as: VITAMIN B12 Take 500 mcg by mouth every other day.   escitalopram 10 MG tablet Commonly known as: LEXAPRO Take 10 mg by mouth in the morning.   finasteride 5 MG tablet Commonly known as:  PROSCAR Take 5 mg by mouth at bedtime.   Fish Oil 1000 MG Caps Take 1,000 mg by mouth 2 (two) times daily.   furosemide 40 MG tablet Commonly known as: LASIX Take 20-40 mg by mouth See admin instructions. Take 40 mg by mouth in the morning & take 20 mg by mouth in the evening.   latanoprost 0.005 % ophthalmic solution Commonly known as: XALATAN Place 1 drop into both eyes at bedtime.   Metamucil 28.3 % Powd Generic drug: Psyllium Take 6 g by mouth in the morning. 8 oz of Water (2 teaspoons)   nitroGLYCERIN 0.4 MG SL tablet Commonly known as: NITROSTAT Place 1 tablet (0.4 mg total) under the tongue every 5 (five) minutes as needed for chest pain.   olmesartan 40 MG tablet Commonly known as: BENICAR Take 40 mg by mouth in the morning.   PRESERVISION AREDS 2 PO Take 1 tablet by mouth 2 (two) times daily.   Repatha SureClick 140 MG/ML Soaj Generic drug: Evolocumab Inject 140 mg into the skin every 14 (fourteen) days.   traMADol 50 MG tablet Commonly known as: ULTRAM Take 1-2 tablets (50-100 mg total) by mouth every 6 (six) hours as needed for moderate pain or severe pain.   Vitamin C 500 MG Caps Take 500 mg by mouth at bedtime.               Discharge Care Instructions  (From admission, onward)           Start     Ordered   08/23/22 0000  Discharge wound care:       Comments: It is good for closed incisions and even open wounds to be washed every day.  Shower every day.  Short baths are fine.  Wash the incisions and wounds clean with soap & water.    You may leave closed incisions open to air if it is dry.   You may cover the incision with clean gauze & replace it after your  daily shower for comfort.  OK to bathe/swim next week = 6/17 onwards   08/23/22 0919            Significant Diagnostic Studies:  Results for orders placed or performed during the hospital encounter of 08/21/22 (from the past 72 hour(s))  Hemoglobin A1c     Status: None   Collection Time: 08/21/22 12:14 PM  Result Value Ref Range   Hgb A1c MFr Bld 5.4 4.8 - 5.6 %    Comment: (NOTE) Pre diabetes:          5.7%-6.4%  Diabetes:              >6.4%  Glycemic control for   <7.0% adults with diabetes    Mean Plasma Glucose 108.28 mg/dL    Comment: Performed at [facility], [address], [place], [place] [address]  Basic metabolic panel     Status: Abnormal   Collection Time: 08/22/22  5:10 AM  Result Value Ref Range   Sodium 139 135 - 145 mmol/L   Potassium 4.0 3.5 - 5.1 mmol/L   Chloride 107 98 - 111 mmol/L   CO2 24 22 - 32 mmol/L   Glucose, Bld 115 (H) 70 - 99 mg/dL    Comment: Glucose reference range applies only to samples taken after fasting for at least 8 hours.   BUN 24 (H) 8 - 23 mg/dL   Creatinine, Ser 6.04 (H) 0.61 - 1.24 mg/dL   Calcium  9.1 8.9 - 10.3 mg/dL   GFR, Estimated 33 (L) >60 mL/min    Comment: (NOTE) Calculated using the CKD-EPI Creatinine Equation (2021)    Anion gap 8 5 - 15    Comment: Performed at [facility], [address] [place], [place] [address]  CBC     Status: Abnormal   Collection Time: 08/22/22  5:10 AM  Result Value Ref Range   WBC 10.1 4.0 - 10.5 K/uL   RBC 3.48 (L) 4.22 - 5.81 MIL/uL   Hemoglobin 10.2 (L) 13.0 - 17.0 g/dL   HCT 34.7 (L) 42.5 - 95.6 %   MCV 95.1 80.0 - 100.0 fL   MCH 29.3 26.0 - 34.0 pg   MCHC 30.8 30.0 - 36.0 g/dL   RDW 38.7 (H) 56.4 - 33.2 %   Platelets 103 (L) 150 - 400 K/uL   nRBC 0.0 0.0 - 0.2 %    Comment: Performed at [facility], [address] [place], [place] [address]  Magnesium     Status: None   Collection Time: 08/22/22  5:10 AM  Result Value Ref  Range   Magnesium 1.9 1.7 - 2.4 mg/dL    Comment: Performed at [facility], [address], [place], [place] [address]  Creatinine, serum     Status: Abnormal   Collection Time: 08/23/22  4:44 AM  Result Value Ref Range   Creatinine, Ser 1.90 (H) 0.61 - 1.24 mg/dL   GFR, Estimated 35 (L) >60 mL/min    Comment: (NOTE) Calculated using the CKD-EPI Creatinine Equation (2021) Performed at [facility], [address] [place], [place] [address]   Potassium     Status: None   Collection Time: 08/23/22  4:44 AM  Result Value Ref Range   Potassium 4.3 3.5 - 5.1 mmol/L    Comment: Performed at [facility], [address], [place], [place] [address]  Hemoglobin     Status: Abnormal   Collection Time: 08/23/22  4:44 AM  Result Value Ref Range   Hemoglobin 10.0 (L) 13.0 - 17.0 g/dL    Comment: Performed at [facility], [address] [place], [place] [address]    No results found.  Past Medical History:  Diagnosis Date   Anal fissure    Anemia, chronic disease 04/04/2020   Anxiety 04/29/2016   Atherosclerosis of both carotid arteries 09/15/2018   Formatting of this note might be different from the original. Follows with [facility].   Benign prostate hyperplasia 04/28/2015   Carotid artery occlusion    Chronic idiopathic gout 04/28/2015   Chronic kidney disease, stage III (moderate) (HCC) 04/28/2015   Colon polyp 04/28/2015   Coronary artery disease involving native coronary artery of native heart with angina pectoris (HCC) 05/06/2016   Essential hypertension 04/28/2015   Glaucoma    High blood pressure    High cholesterol    History of COVID-19 05/20/2019   Formatting of this note might be different from the original. Late January 2021.   History of kidney stones    Hyperlipidemia 04/28/2015   Hyperparathyroidism (HCC) 04/28/2015   Kidney stone    Myocardial infarction [facility])    Nonrheumatic aortic  valve stenosis 09/15/2018   Formatting of this note might be different from the original. Follows with [facility]   Obesity 04/28/2015   Osteoarthritis 04/28/2015   Pre-diabetes    Prediabetes 03/07/2017   S/P TAVR (transcatheter aortic valve replacement) 03/26/2022   s/p  TAVR with a 26 mm Edwards S3UR via the TF approach by Dr. 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 Otherwise normal colonsocopy   CORONARY ANGIOPLASTY     HEMORRHOID SURGERY     HEMORROIDECTOMY     INTRAOPERATIVE TRANSTHORACIC ECHOCARDIOGRAM N/A 03/26/2022   Procedure: INTRAOPERATIVE TRANSTHORACIC ECHOCARDIOGRAM;  Surgeon: [name], MD;  Location: [facility] OR;  Service: Open Heart Surgery;  Laterality: N/A;   PROCTOSCOPY N/A 08/21/2022   Procedure: RIGID PROCTOSCOPY;  Surgeon: Karie Soda, MD;  Location: [facility] ORS;  Service: General;  Laterality: N/A;   RIGHT/LEFT HEART CATH AND CORONARY ANGIOGRAPHY N/A 02/20/2022   Procedure: RIGHT/LEFT HEART CATH AND CORONARY ANGIOGRAPHY;  Surgeon: [name], MD;  Location: [facility] INVASIVE CV LAB;  Service: Cardiovascular;  Laterality: N/A;   TONSILLECTOMY     TRANSCATHETER AORTIC VALVE REPLACEMENT, TRANSFEMORAL Right 03/26/2022   Procedure: Transcatheter Aortic Valve Replacement, Transfemoral using Edwards 26 MM SAPIEN 3 Ultra;  Surgeon: [name], MD;  Location: [facility] OR;  Service: Open Heart Surgery;  Laterality: Right;  Transfemoral    Social History   Socioeconomic History   Marital status: Married    Spouse name: Not on file   Number of children: 3   Years of education: Not on file   Highest education level: Not on file  Occupational History   Occupation: retired  Tobacco Use    Smoking status: Never   Smokeless tobacco: Never  Vaping Use   Vaping Use: Never used  Substance and Sexual Activity   Alcohol use: Not Currently   Drug use: Not Currently   Sexual activity: Not Currently  Other Topics Concern   Not on file  Social History Narrative   Not on file   Social Determinants of Health   Financial Resource Strain: Not on file  Food Insecurity: No Food Insecurity (08/21/2022)   Hunger Vital Sign    Worried About Running Out of Food in the Last Year: Never true    Ran Out of Food in the Last Year: Never true  Transportation Needs: No Transportation Needs (08/21/2022)   PRAPARE - Administrator, Civil Service (Medical): No    Lack of Transportation (Non-Medical): No  Physical Activity: Not on file  Stress: Not on file  Social Connections: Not on file  Intimate Partner Violence: Not At Risk (08/21/2022)   Humiliation, Afraid, Rape, and Kick questionnaire    Fear of Current or Ex-Partner: No    Emotionally Abused: No    Physically Abused: No    Sexually Abused: No    Family History  Problem Relation Age of Onset   Cancer Mother    Heart attack Mother    Hypertension Mother    Diabetes Mother    Diabetes type II Mother    Hypertension Father    Heart attack Father    Colon polyps Father    Heart attack Brother    Diabetes Brother    Diabetes type I Brother    Esophageal cancer Neg Hx    Colon cancer Neg Hx    Liver disease Neg Hx     Current Facility-Administered Medications  Medication Dose Route Frequency  Provider Last Rate Last Admin   0.9 %  sodium chloride infusion  250 mL Intravenous PRN Karie Soda, MD       acetaminophen (TYLENOL) tablet 1,000 mg  1,000 mg Oral Trecia Rogers, MD   1,000 mg at 08/23/22 0519   allopurinol (ZYLOPRIM) tablet 100 mg  100 mg Oral BID Karie Soda, MD   100 mg at 08/22/22 2046   alum & mag hydroxide-simeth (MAALOX/MYLANTA) 200-200-20 MG/5ML suspension 30 mL  30 mL Oral Q6H PRN Karie Soda, MD       amLODipine (NORVASC) tablet 5 mg  5 mg Oral Daily Karie Soda, MD   5 mg at 08/22/22 0940   ascorbic acid (VITAMIN C) tablet 500 mg  500 mg Oral Laurena Slimmer, MD   500 mg at 08/22/22 2046   aspirin EC tablet 81 mg  81 mg Oral Laurena Slimmer, MD   81 mg at 08/22/22 2046   cyanocobalamin (VITAMIN B12) tablet 500 mcg  500 mcg Oral Lanice Shirts, MD   500 mcg at 08/22/22 0940   diphenhydrAMINE (BENADRYL) 12.5 MG/5ML elixir 12.5 mg  12.5 mg Oral Q6H PRN Karie Soda, MD       Or   diphenhydrAMINE (BENADRYL) injection 12.5 mg  12.5 mg Intravenous Q6H PRN Karie Soda, MD       enoxaparin (LOVENOX) injection 40 mg  40 mg Subcutaneous Q24H Johnson, Kelly R, PA-C       escitalopram (LEXAPRO) tablet 10 mg  10 mg Oral Daily Karie Soda, MD   10 mg at 08/22/22 0940   feeding supplement (ENSURE SURGERY) liquid 237 mL  237 mL Oral BID BM Karie Soda, MD   237 mL at 08/22/22 0941   finasteride (PROSCAR) tablet 5 mg  5 mg Oral Laurena Slimmer, MD   5 mg at 08/22/22 2045   hydrALAZINE (APRESOLINE) injection 10 mg  10 mg Intravenous Q2H PRN Karie Soda, MD       HYDROmorphone (DILAUDID) injection 0.5-2 mg  0.5-2 mg Intravenous Q4H PRN Karie Soda, MD       irbesartan (AVAPRO) tablet 150 mg  150 mg Oral Daily Karie Soda, MD   150 mg at 08/22/22 0940   lactated ringers bolus 1,000 mL  1,000 mL Intravenous Q8H PRN Karie Soda, MD       latanoprost (XALATAN) 0.005 % ophthalmic solution 1 drop  1 drop Both Eyes Laurena Slimmer, MD   1 drop at 08/22/22 2046   lip balm (CARMEX) ointment   Topical BID Karie Soda, MD   Given at 08/22/22 2047   magic mouthwash  15 mL Oral QID PRN Karie Soda, MD       melatonin tablet 3 mg  3 mg Oral QHS PRN Karie Soda, MD       menthol-cetylpyridinium (CEPACOL) lozenge 3 mg  1 lozenge Oral PRN Karie Soda, MD       methocarbamol (ROBAXIN) 1,000 mg in dextrose 5 % 100 mL IVPB  1,000 mg Intravenous Q6H PRN Karie Soda, MD        methocarbamol (ROBAXIN) tablet 1,000 mg  1,000 mg Oral Q6H PRN Karie Soda, MD       metoprolol tartrate (LOPRESSOR) injection 5 mg  5 mg Intravenous Q6H PRN Karie Soda, MD       nitroGLYCERIN (NITROSTAT) SL tablet 0.4 mg  0.4 mg Sublingual Q5 min PRN Karie Soda, MD       ondansetron [facility]) tablet 4 mg  4 mg Oral Q6H PRN Karie Soda, MD       Or   ondansetron [facility]) injection 4 mg  4 mg Intravenous Q6H PRN Karie Soda, MD       phenol (CHLORASEPTIC) mouth spray 2 spray  2 spray Mouth/Throat PRN Karie Soda, MD       prochlorperazine (COMPAZINE) tablet 10 mg  10 mg Oral Q6H PRN Karie Soda, MD       Or   prochlorperazine (COMPAZINE) injection 5-10 mg  5-10 mg Intravenous Q6H PRN Karie Soda, MD       simethicone (MYLICON) chewable tablet 40 mg  40 mg Oral Q6H PRN Karie Soda, MD   40 mg at 08/21/22 2058   sodium chloride flush (NS) 0.9 % injection 3 mL  3 mL Intravenous Catha Gosselin, MD   3 mL at 08/22/22 2048   sodium chloride flush (NS) 0.9 % injection 3 mL  3 mL Intravenous PRN Karie Soda, MD       sodium chloride irrigation 0.9 % 3,000 mL  3,000 mL Irrigation Continuous Dayzee Trower, Viviann Spare, MD       traMADol Janean Sark) tablet 50-100 mg  50-100 mg Oral Q6H PRN Karie Soda, MD   50 mg at 08/21/22 1912     Allergies  Allergen Reactions   Aldactone [Spironolactone] Other (See Comments)    Patient was in a panic   Statins Other (See Comments)    Myalgia     Signed:   Ardeth Sportsman, MD, FACS, MASCRS Esophageal, Gastrointestinal & Colorectal Surgery Robotic and Minimally Invasive Surgery  [facility] [address], [address] [place], [place] [address] [phone] Fax [phone] Main  CONTACT INFORMATION:  Weekday (9AM-5PM): Call [facility] main office at [phone]  Weeknight (5PM-9AM) or Weekend/Holiday: Check [email or web address] (password " [email or web address]") for General Surgery [facility] coverage  (Please, do  not use SecureChat as it is not reliable communication to reach operating surgeons for immediate patient care given surgeries/outpatient duties/clinic/cross-coverage/off post-call which would lead to a delay in care.  Epic staff messaging available for outptient concerns, but may not be answered for 48 hours or more).     08/23/2022, 9:19 AM

## 2022-08-26 LAB — SURGICAL PATHOLOGY

## 2022-09-03 NOTE — Progress Notes (Unsigned)
Cardiology Office Note:    Date:  09/04/2022   ID:  Matthew Mcneil, DOB 1941-02-08, MRN [id number]  PCP:  Gordan Payment., MD  Cardiologist:  Norman Herrlich, MD    Referring MD: Gordan Payment., MD    ASSESSMENT:    1. S/P TAVR (transcatheter aortic valve replacement)   2. Hypertensive heart disease, unspecified whether heart failure present   3. Stage 3 chronic kidney disease, unspecified whether stage 3a or 3b CKD (HCC)   4. Pure hypercholesterolemia    PLAN:    In order of problems listed above:  Kalven continues to do well after his TAVR having no cardiovascular symptoms New York Heart Association class I I will continue aspirin PCSK9 inhibitor as he is statin intolerant and his current antihypertensives including calcium channel blocker amlodipine and ARB.  Given instructions and will use a validated device in good technique and bring determinations to the office with him in the future Surveillance echocardiogram in October after [facility] and we will recheck his carotid duplex with a right carotid bruit asymptomatic Continue his current lipid-lowering therapy LDL is at target   Next appointment: 6 months   Medication Adjustments/Labs and Tests Ordered: Current medicines are reviewed at length with the patient today.  Concerns regarding medicines are outlined above.  No orders of the defined types were placed in this encounter.  No orders of the defined types were placed in this encounter.    History of Present Illness:    Matthew Mcneil is a 82 y.o. male with a hx of aortic stenosis with TAVR 03/26/2022  hypertension CKD CAD and hyperlipidemia last seen 02/13/2022.  He had recent surgery for colovesical fistula.  His echocardiogram 01/08/2022 shows aortic valve to be tricuspid calcified restricted mild regurgitation and severe aortic stenosis with peak velocity of 4 m/s squared valve area 0.74 cm VTI ratio 0.24.  His ejection fraction was mildly reduced 45 to  50% normal right ventricular size function small pericardial effusion mild mitral regurgitation. Carotid duplex 01/08/2022 showed right ICA stenosis 60 to 79% left ICA stenosis 40 to 59%.  Recent labs 05/08/2022 hemoglobin 13.4 platelets 121,000 sodium 147 potassium 4.8 Cholesterol 119 LDL 57 triglycerides 138 HDL 54 non-HDL cholesterol 65  Compliance with diet, lifestyle and medications: Yes  His wife is present participates in evaluation decision making especially with his visual impairment He checks home blood pressure and he interchanges a wrist and upper arm device he gets numbers in the range of 130 or less systolic. I instructed him in good technique you use a validated [employer] device he has at home and only use upper arm determinations in the begin to record and bring him to office visits in the future He has recovered from his extensive surgery He is not having angina shortness of breath palpitation chest pain or syncope He tolerates his PCSK9 inhibitor without muscle pain or weakness Past Medical History:  Diagnosis Date   Anal fissure    Anemia, chronic disease 04/04/2020   Anxiety 04/29/2016   Atherosclerosis of both carotid arteries 09/15/2018   Formatting of this note might be different from the original. Follows with [facility].   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 Late January 2021.   History of kidney stones    Hyperlipidemia 04/28/2015   Hyperparathyroidism (HCC) 04/28/2015   Kidney stone    Myocardial infarction  [facility])    Nonrheumatic aortic valve stenosis 09/15/2018   Formatting of this note might be different from the original. Follows with [facility].   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 Otherwise normal colonsocopy   CORONARY ANGIOPLASTY     HEMORRHOID SURGERY     HEMORROIDECTOMY     INTRAOPERATIVE TRANSTHORACIC ECHOCARDIOGRAM N/A 03/26/2022   Procedure: INTRAOPERATIVE TRANSTHORACIC ECHOCARDIOGRAM;  Surgeon: [name], MD;  Location: [facility] OR;  Service: Open Heart Surgery;  Laterality: N/A;   PROCTOSCOPY N/A 08/21/2022   Procedure: RIGID PROCTOSCOPY;  Surgeon: Karie Soda, MD;  Location: [facility] ORS;  Service: General;  Laterality: N/A;   RIGHT/LEFT HEART CATH AND CORONARY ANGIOGRAPHY N/A 02/20/2022   Procedure: RIGHT/LEFT HEART CATH AND CORONARY ANGIOGRAPHY;  Surgeon: [name], MD;  Location: [facility] INVASIVE CV LAB;  Service: Cardiovascular;  Laterality: N/A;   TONSILLECTOMY     TRANSCATHETER AORTIC VALVE REPLACEMENT, TRANSFEMORAL Right 03/26/2022   Procedure: Transcatheter Aortic Valve Replacement, Transfemoral using Edwards 26 MM SAPIEN 3 Ultra;  Surgeon: [name], MD;  Location: [facility] OR;  Service: Open Heart Surgery;  Laterality: Right;  Transfemoral    Current Medications: Current Meds  Medication Sig   acetaminophen (TYLENOL) 500 MG tablet Take 500-1,000 mg by mouth every 6 (six) hours as needed for moderate pain.   allopurinol (ZYLOPRIM) 100 MG tablet Take 100 mg by mouth  2 (two) times daily.   amLODipine (NORVASC) 5 MG tablet Take 1 tablet (5 mg total) by mouth daily.   Ascorbic Acid (VITAMIN C) 500 MG CAPS Take 500 mg by mouth at bedtime.   aspirin EC 81 MG tablet Take 1 tablet (81 mg total) by mouth daily. (Patient taking differently: Take 81 mg by mouth at bedtime.)   escitalopram (LEXAPRO) 10 MG tablet Take 10 mg by mouth in the morning.   Evolocumab (REPATHA SURECLICK) 140 MG/ML SOAJ Inject 140 mg into the skin every 14 (fourteen) days.   finasteride (PROSCAR) 5 MG tablet Take 5 mg by mouth at bedtime.   furosemide (LASIX) 40 MG tablet Take 20-40 mg by mouth See admin instructions. Take 40 mg by mouth in the morning & take 20 mg by mouth in the evening.   latanoprost (XALATAN) 0.005 % ophthalmic solution Place 1 drop into both eyes at bedtime.   nitroGLYCERIN (NITROSTAT) 0.4 MG SL tablet Place 1 tablet (0.4 mg total) under the tongue every 5 (five) minutes as needed for chest pain.   olmesartan (BENICAR) 40 MG tablet Take 40 mg by mouth in the morning.   Omega-3 Fatty Acids (FISH OIL) 1000 MG CAPS Take 1,000 mg  by mouth 2 (two) times daily.   Psyllium (METAMUCIL) 28.3 % POWD Take 6 g by mouth in the morning. 8 oz of Water (2 teaspoons)   traMADol (ULTRAM) 50 MG tablet Take 1-2 tablets (50-100 mg total) by mouth every 6 (six) hours as needed for moderate pain or severe pain.   vitamin B-12 (CYANOCOBALAMIN) 500 MCG tablet Take 500 mcg by mouth every other day.   [DISCONTINUED] Multiple Vitamins-Minerals (PRESERVISION AREDS 2 PO) Take 1 tablet by mouth 2 (two) times daily.     Allergies:   Aldactone [spironolactone] and Statins   Social History   Socioeconomic History   Marital status: Married    Spouse name: Not on file   Number of children: 3   Years of education: Not on file   Highest education level: Not on file  Occupational History   Occupation: retired  Tobacco Use   Smoking status: Never   Smokeless tobacco: Never  Vaping Use   Vaping Use:  Never used  Substance and Sexual Activity   Alcohol use: Not Currently   Drug use: Not Currently   Sexual activity: Not Currently  Other Topics Concern   Not on file  Social History Narrative   Not on file   Social Determinants of Health   Financial Resource Strain: Not on file  Food Insecurity: No Food Insecurity (08/21/2022)   Hunger Vital Sign    Worried About Running Out of Food in the Last Year: Never true    Ran Out of Food in the Last Year: Never true  Transportation Needs: No Transportation Needs (08/21/2022)   PRAPARE - Administrator, Civil Service (Medical): No    Lack of Transportation (Non-Medical): No  Physical Activity: Not on file  Stress: Not on file  Social Connections: Not on file       EKGs/Labs/Other Studies Reviewed:    The following studies were reviewed today:  Cardiac Studies & Procedures   CARDIAC CATHETERIZATION  CARDIAC CATHETERIZATION 02/20/2022  Narrative 1.  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 2.  Capacious iliofemoral vessels of the right side. 3.  Normal cardiac output of 7 L/min and cardiac index of 3.6 L/min/m with mean RA pressure of 3 mmHg and mean wedge pressure of 10 mmHg.  Recommendation: Continue evaluation for aortic valve intervention.  Findings Coronary Findings Diagnostic  Dominance: Left  Left Anterior Descending There is mild diffuse disease throughout the vessel.  Left Circumflex The vessel exhibits minimal luminal irregularities.  Right Coronary Artery Vessel is small. There is mild diffuse disease throughout the vessel.  Intervention  No interventions have been documented.     ECHOCARDIOGRAM  ECHOCARDIOGRAM COMPLETE 04/29/2022  Narrative ECHOCARDIOGRAM REPORT    Patient Name:   Armanii Pressnell Date of Exam: 04/29/2022 Medical Rec #:  [id number]            Height:       66.0 in Accession #:    [id number]           Weight:       191.6 lb Date of Birth:   11-17-1940            BSA:          1.964 m Patient Age:    81 years             BP:           140/72 mmHg Patient Gender: M  HR:           75 bpm. Exam Location:  [address]  Procedure: 2D Echo, Cardiac Doppler and Color Doppler  Indications:    Z95.2 Status Post TAVR  History:        Patient has prior history of Echocardiogram examinations, most recent 03/27/2022. CAD, Stroke, Aortic Valve Disease; Risk Factors:Hypertension, Dyslipidemia and Family History of Coronary Artery Disease. Aortic Stenosis status post TAVR (03-26-22, 26mm Edwards S3UR).  Sonographer:    Farrel Conners RDCS Referring Phys: Wille Celeste THOMPSON  IMPRESSIONS   1. Left ventricular ejection fraction, by estimation, is 50 to 55%. The left ventricle has low normal function. The left ventricle has no regional wall motion abnormalities. Left ventricular diastolic parameters are indeterminate. 2. Right ventricular systolic function is normal. The right ventricular size is normal. There is normal pulmonary artery systolic pressure. 3. Left atrial size was mildly dilated. 4. Right atrial size was mildly dilated. 5. Mild mitral valve regurgitation. Moderate mitral annular calcification. 6. S/p TAVR ( 26 mm Edwards S3UR, procedure date 03/26/22) Keak and mean gradients through the valve are 28 and 15 mm Hg AVA (VTI) is 1.75 cm2. Dimensionless index is 0.42 Compared to echo from 03/27/22 there is a slight increase in mean gradient (11 to 15 mm Hg ). . The aortic valve has been repaired/replaced. Aortic valve regurgitation is not visualized. 7. The inferior vena cava is normal in size with greater than 50% respiratory variability, suggesting right atrial pressure of 3 mmHg.  FINDINGS Left Ventricle: Left ventricular ejection fraction, by estimation, is 50 to 55%. The left ventricle has low normal function. The left ventricle has no regional wall motion abnormalities. The left ventricular internal cavity  size was normal in size. There is no left ventricular hypertrophy. Left ventricular diastolic parameters are indeterminate.  Right Ventricle: The right ventricular size is normal. Right vetricular wall thickness was not assessed. Right ventricular systolic function is normal. There is normal pulmonary artery systolic pressure. The tricuspid regurgitant velocity is 2.45 m/s, and with an assumed right atrial pressure of 3 mmHg, the estimated right ventricular systolic pressure is 27.0 mmHg.  Left Atrium: Left atrial size was mildly dilated.  Right Atrium: Right atrial size was mildly dilated.  Pericardium: Trivial pericardial effusion is present.  Mitral Valve: There is mild thickening of the mitral valve leaflet(s). Moderate mitral annular calcification. Mild mitral valve regurgitation.  Tricuspid Valve: The tricuspid valve is normal in structure. Tricuspid valve regurgitation is trivial.  Aortic Valve: S/p TAVR ( 26 mm Edwards S3UR, procedure date 03/26/22) Keak and mean gradients through the valve are 28 and 15 mm Hg AVA (VTI) is 1.75 cm2. Dimensionless index is 0.42 Compared to echo from 03/27/22 there is a slight increase in mean gradient (11 to 15 mm Hg ). The aortic valve has been repaired/replaced. Aortic valve regurgitation is not visualized. Aortic valve mean gradient measures 15.0 mmHg. Aortic valve peak gradient measures 27.5 mmHg. Aortic valve area, by VTI measures 1.73 cm.  Pulmonic Valve: The pulmonic valve was normal in structure. Pulmonic valve regurgitation is mild to moderate.  Aorta: The aortic root and ascending aorta are structurally normal, with no evidence of dilitation.  Venous: The inferior vena cava is normal in size with greater than 50% respiratory variability, suggesting right atrial pressure of 3 mmHg.  IAS/Shunts: No atrial level shunt detected by color flow Doppler.   LEFT VENTRICLE PLAX 2D LVIDd:         5.10 cm  Diastology LVIDs:         3.90 cm   LV  e' medial:    6.53 cm/s LV PW:         1.00 cm   LV E/e' medial:  15.6 LV IVS:        1.00 cm   LV e' lateral:   7.29 cm/s LVOT diam:     2.30 cm   LV E/e' lateral: 14.0 LV SV:         93 LV SV Index:   47 LVOT Area:     4.15 cm   RIGHT VENTRICLE RV Basal diam:  3.90 cm RV Mid diam:    3.00 cm RV S prime:     24.70 cm/s TAPSE (M-mode): 3.0 cm  LEFT ATRIUM             Index        RIGHT ATRIUM           Index LA diam:        4.60 cm 2.34 cm/m   RA Area:     20.50 cm LA Vol (A2C):   48.1 ml 24.50 ml/m  RA Volume:   60.40 ml  30.76 ml/m LA Vol (A4C):   72.0 ml 36.67 ml/m LA Biplane Vol: 62.9 ml 32.03 ml/m AORTIC VALVE AV Area (Vmax):    1.72 cm AV Area (Vmean):   1.66 cm AV Area (VTI):     1.73 cm AV Vmax:           262.00 cm/s AV Vmean:          178.500 cm/s AV VTI:            0.536 m AV Peak Grad:      27.5 mmHg AV Mean Grad:      15.0 mmHg LVOT Vmax:         108.50 cm/s LVOT Vmean:        71.400 cm/s LVOT VTI:          0.224 m LVOT/AV VTI ratio: 0.42  AORTA Ao Root diam: 3.50 cm Ao Asc diam:  3.90 cm  MITRAL VALVE                TRICUSPID VALVE MV Area (PHT): cm          TR Peak grad:   24.0 mmHg MV Decel Time: 206 msec     TR Vmax:        245.00 cm/s MV E velocity: 102.00 cm/s MV A velocity: 117.00 cm/s  SHUNTS MV E/A ratio:  0.87         Systemic VTI:  0.22 m Systemic Diam: 2.30 cm  Dietrich Pates MD Electronically signed by Dietrich Pates MD Signature Date/Time: 04/29/2022/12:01:06 PM    Final     CT SCANS  CT CORONARY MORPH W/CTA COR W/SCORE 03/14/2022  Addendum 03/14/2022  6:47 AM ADDENDUM REPORT: 03/14/2022 06:45  ADDENDUM: Extracardiac findings were described separately under dictation for contemporaneously obtained CTA chest, abdomen and pelvis dated 03/08/2022, please see that report for full description of relevant extracardiac findings.   Electronically Signed By: Trudie Reed M.D. On: 03/14/2022 06:45  Narrative CLINICAL DATA:   Severe Aortic Stenosis.  EXAM: Cardiac TAVR CT  TECHNIQUE: A non-contrast, gated CT scan was obtained with axial slices of 3 mm through the heart for aortic valve calcium scoring. A 120 kV retrospective, gated, contrast cardiac scan was obtained. Gantry rotation speed was 250 msecs and collimation  was 0.6 mm. Nitroglycerin was not given. The 3D data set was reconstructed in 5% intervals of the 0-95% of the R-R cycle. Systolic and diastolic phases were analyzed on a dedicated workstation using MPR, MIP, and VRT modes. The patient received 100 cc of contrast.  FINDINGS: Image quality: Excellent.  Noise artifact is: Limited.  Valve Morphology: Tricuspid aortic valve with diffuse severe calcifications. Severely restricted leaflet movement in systole.  Aortic Valve Calcium score: 1458  Aortic annular dimension:  Phase assessed: 15%  Annular area: 452 mm2  Annular perimeter: 76.5 mm  Max diameter: 26.0 mm  Min diameter: 23.0 mm  Annular and subannular calcification: None.  Membranous septum length: 8.9 mm  Optimal coplanar projection: LAO 21 CAU 13  Coronary Artery Height above Annulus:  Left Main: 11.0 mm  Right Coronary: 18.7 mm  Sinus of Valsalva Measurements:  Non-coronary: 32 mm  Right-coronary: 31 mm  Left-coronary: 33 mm  Sinus of Valsalva Height:  Non-coronary: 21.2 mm  Right-coronary: 22.9 mm  Left-coronary: 21.7 mm  Sinotubular Junction: 29 mm  Ascending Thoracic Aorta: 36 mm  Coronary Arteries: Normal coronary origin. Left dominance. The study was performed without use of NTG and is insufficient for plaque evaluation. Please refer to recent cardiac catheterization for coronary assessment.  Cardiac Morphology:  Right Atrium: Right atrial size is within normal limits.  Right Ventricle: The right ventricular cavity is within normal limits.  Left Atrium: Left atrial size is normal in size with no left atrial appendage filling  defect.  Left Ventricle: The ventricular cavity size is within normal limits.  Pulmonary arteries: Dilated pulmonary artery suggestive of pulmonary hypertension.  Pulmonary veins: Normal pulmonary venous drainage.  Pericardium: Normal thickness with no significant effusion or calcium present.  Mitral Valve: The mitral valve is normal structure without significant calcification.  Extra-cardiac findings: See attached radiology report for non-cardiac structures.  IMPRESSION: 1. Annular measurements favor a 26 mm S3 or 29 mm Evolut Pro.  2. No significant annular or subannular calcifications.  3. Sufficient coronary to annulus distance.  4. Optimal Fluoroscopic Angle for Delivery: LAO 21 CAU 13  5. 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  Gerri Spore T. Flora Lipps, MD  Electronically Signed: By: Lennie Odor M.D. On: 03/10/2022 16:22              Recent Labs: 02/13/2022: NT-Pro BNP 1,314 03/22/2022: ALT 23 08/22/2022: BUN 24; Magnesium 1.9; Platelets 103; Sodium 139 08/23/2022: Creatinine, Ser 1.90; Hemoglobin 10.0; Potassium 4.3  Recent Lipid Panel    Component Value Date/Time   CHOL 121 01/16/2021 0857   TRIG 172 (H) 01/16/2021 0857   HDL 44 01/16/2021 0857   CHOLHDL 2.8 01/16/2021 0857   LDLCALC 48 01/16/2021 0857   His EKG in January showed sinus rhythm APC Physical Exam:    VS:  BP (!) 166/72 (BP Location: Left Arm, Patient Position: Sitting)   Pulse 80   Ht 5\' 6"  (1.676 m)   Wt 188 lb 12.8 oz (85.6 kg)   SpO2 94%   BMI 30.47 kg/m     Wt Readings from Last 3 Encounters:  09/04/22 188 lb 12.8 oz (85.6 kg)  08/23/22 201 lb 1 oz (91.2 kg)  08/20/22 190 lb (86.2 kg)     GEN:  Well nourished, well developed in no acute distress HEENT: Normal NECK: No JVD; he has a moderate systolic right carotid bruit LYMPHATICS: No lymphadenopathy CARDIAC: RRR, no murmurs, rubs, gallops RESPIRATORY:  Clear to auscultation without rales, wheezing or  rhonchi  ABDOMEN: Soft, non-tender, non-distended MUSCULOSKELETAL:  No edema; No deformity  SKIN: Warm and dry NEUROLOGIC:  Alert and oriented x 3 PSYCHIATRIC:  Normal affect    Signed, Norman Herrlich, MD  09/04/2022 8:35 AM    [facility]

## 2022-09-04 ENCOUNTER — Telehealth: Payer: Self-pay | Admitting: Cardiology

## 2022-09-04 ENCOUNTER — Encounter: Payer: Self-pay | Admitting: Cardiology

## 2022-09-04 ENCOUNTER — Ambulatory Visit: Payer: Medicare HMO | Attending: Cardiology | Admitting: Cardiology

## 2022-09-04 VITALS — BP 132/60 | HR 80 | Ht 66.0 in | Wt 188.8 lb

## 2022-09-04 DIAGNOSIS — I119 Hypertensive heart disease without heart failure: Secondary | ICD-10-CM | POA: Diagnosis not present

## 2022-09-04 DIAGNOSIS — E7800 Pure hypercholesterolemia, unspecified: Secondary | ICD-10-CM

## 2022-09-04 DIAGNOSIS — N183 Chronic kidney disease, stage 3 unspecified: Secondary | ICD-10-CM

## 2022-09-04 DIAGNOSIS — Z952 Presence of prosthetic heart valve: Secondary | ICD-10-CM

## 2022-09-04 NOTE — Patient Instructions (Addendum)
Medication Instructions:  Your physician recommends that you continue on your current medications as directed. Please refer to the Current Medication list given to you today.  *If you need a refill on your cardiac medications before your next appointment, please call your pharmacy*   Lab Work: None If you have labs (blood work) drawn today and your tests are completely normal, you will receive your results only by: MyChart Message (if you have MyChart) OR A paper copy in the mail If you have any lab test that is abnormal or we need to change your treatment, we will call you to review the results.   Testing/Procedures: Your physician has requested that you have a carotid duplex. This test is an ultrasound of the carotid arteries in your neck. It looks at blood flow through these arteries that supply the brain with blood. Allow one hour for this exam. There are no restrictions or special instructions.   Your physician has requested that you have an echocardiogram. Echocardiography is a painless test that uses sound waves to create images of your heart. It provides your doctor with information about the size and shape of your heart and how well your heart's chambers and valves are working. This procedure takes approximately one hour. There are no restrictions for this procedure. Please do NOT wear cologne, perfume, aftershave, or lotions (deodorant is allowed). Please arrive 15 minutes prior to your appointment time.    Follow-Up: At [facility], you and your health needs are our priority.  As part of our continuing mission to provide you with exceptional heart care, we have created designated Provider Care Teams.  These Care Teams include your primary Cardiologist (physician) and Advanced Practice Providers (APPs -  Physician Assistants and Nurse Practitioners) who all work together to provide you with the care you need, when you need it.  We recommend signing up for the patient  portal called "MyChart".  Sign up information is provided on this After Visit Summary.  MyChart is used to connect with patients for Virtual Visits (Telemedicine).  Patients are able to view lab/test results, encounter notes, upcoming appointments, etc.  Non-urgent messages can be sent to your provider as well.   To learn more about what you can do with MyChart, go to [email or web address].    Your next appointment:   6 month(s)  Provider:   Norman Herrlich, MD    Other Instructions None     [facility]  Tips to measure your blood pressure correctly  To determine whether you have hypertension, a medical professional will take a blood pressure reading. How you prepare for the test, the position of your arm, and other factors can change a blood pressure reading by 10% or more. That could be enough to hide high blood pressure, start you on a drug you don't really need, or lead your doctor to incorrectly adjust your medications. National and international guidelines offer specific instructions for measuring blood pressure. If a doctor, nurse, or medical assistant isn't doing it right, don't hesitate to ask him or her to get with the guidelines. Here's what you can do to ensure a correct reading:  Don't drink a caffeinated beverage or smoke during the 30 minutes before the test.  Sit quietly for five minutes before the test begins.  During the measurement, sit in a chair with your feet on the floor and your arm supported so your elbow is at about heart level.  The inflatable part of the cuff should completely cover at  least 80% of your upper arm, and the cuff should be placed on bare skin, not over a shirt.  Don't talk during the measurement.  Have your blood pressure measured twice, with a brief break in between. If the readings are different by 5 points or more, have it done a third time. There are times to break these rules. If you sometimes feel lightheaded when getting out of bed in the  morning or when you stand after sitting, you should have your blood pressure checked while seated and then while standing to see if it falls from one position to the next. Because blood pressure varies throughout the day, your doctor will rarely diagnose hypertension on the basis of a single reading. Instead, he or she will want to confirm the measurements on at least two occasions, usually within a few weeks of one another. The exception to this rule is if you have a blood pressure reading of 180/110 mm Hg or higher. A result this high usually calls for prompt treatment. It's also a good idea to have your blood pressure measured in both arms at least once, since the reading in one arm (usually the right) may be higher than that in the left. A 2014 study in The American Journal of Medicine of nearly 3,400 people found average arm- to-arm differences in systolic blood pressure of about 5 points. The higher number should be used to make treatment decisions. In 2017, new guidelines from the American Heart Association, the [employer] of Cardiology, and nine other health organizations lowered the diagnosis of high blood pressure to 130/80 mm Hg or higher for all adults. The guidelines also redefined the various blood pressure categories to now include normal, elevated, Stage 1 hypertension, Stage 2 hypertension, and hypertensive crisis (see "Blood pressure categories"). Blood pressure categories  Blood pressure category SYSTOLIC (upper number)  DIASTOLIC (lower number)  Normal Less than 120 mm Hg and Less than 80 mm Hg  Elevated 120-129 mm Hg and Less than 80 mm Hg  High blood pressure: Stage 1 hypertension 130-139 mm Hg or 80-89 mm Hg  High blood pressure: Stage 2 hypertension 140 mm Hg or higher or 90 mm Hg or higher  Hypertensive crisis (consult your doctor immediately) Higher than 180 mm Hg and/or Higher than 120 mm Hg  Source: American Heart Association and American Stroke Association. For more  on getting your blood pressure under control, buy Controlling Your Blood Pressure, a Special Health Report from [facility]

## 2022-09-04 NOTE — Telephone Encounter (Signed)
Patient's wife called to make the provider aware that the patient is taking multivitamin Preservison for his eyes. Spoke with Dr. Dulce Sellar and he stated that he had seen that he was taking it when the patient had his office visit today. Dr. Dulce Sellar stated that he was fine to continue taking the medication. Called the patient's wife and informed her that Dr. Dulce Sellar was aware that the patient was taking Preservision. Patient's wife had no further questions at this time.

## 2022-09-04 NOTE — Telephone Encounter (Signed)
Pt's wife calling to make provider aware that pt is taking multivitamin Preservison for his eyes. Please advise.

## 2022-12-25 ENCOUNTER — Ambulatory Visit (INDEPENDENT_AMBULATORY_CARE_PROVIDER_SITE_OTHER): Payer: Medicare HMO

## 2022-12-25 ENCOUNTER — Ambulatory Visit: Payer: Medicare HMO | Attending: Cardiology

## 2022-12-25 DIAGNOSIS — E7800 Pure hypercholesterolemia, unspecified: Secondary | ICD-10-CM | POA: Diagnosis not present

## 2022-12-25 DIAGNOSIS — I6521 Occlusion and stenosis of right carotid artery: Secondary | ICD-10-CM | POA: Diagnosis not present

## 2022-12-25 DIAGNOSIS — Z952 Presence of prosthetic heart valve: Secondary | ICD-10-CM

## 2022-12-25 DIAGNOSIS — I119 Hypertensive heart disease without heart failure: Secondary | ICD-10-CM

## 2022-12-25 DIAGNOSIS — N183 Chronic kidney disease, stage 3 unspecified: Secondary | ICD-10-CM

## 2022-12-26 LAB — ECHOCARDIOGRAM COMPLETE
AR max vel: 1.25 cm2
AV Area VTI: 1.33 cm2
AV Area mean vel: 1.27 cm2
AV Mean grad: 12.3 mm[Hg]
AV Peak grad: 23.9 mm[Hg]
Ao pk vel: 2.45 m/s
S' Lateral: 4 cm

## 2022-12-27 ENCOUNTER — Other Ambulatory Visit: Payer: Self-pay

## 2022-12-27 DIAGNOSIS — I6521 Occlusion and stenosis of right carotid artery: Secondary | ICD-10-CM

## 2023-01-08 ENCOUNTER — Other Ambulatory Visit: Payer: Self-pay | Admitting: Cardiology

## 2023-01-22 ENCOUNTER — Telehealth: Payer: Self-pay | Admitting: Cardiology

## 2023-01-22 NOTE — Telephone Encounter (Signed)
New Message:       Patient's wife called to let Dr Hulen Shouts nurse that patient's paperwork for his assitsance for Repatha is on the way

## 2023-01-27 NOTE — Telephone Encounter (Signed)
Called Elease Hashimoto the patient's wife and she reported that [employer] had faxed a diagnosis verification form for the patient's Repatha to our office for [place] to complete. I explained that we would look for it in our fax queu and complete the form. Patient verbalized understanding and had no further questions at this time.

## 2023-01-28 ENCOUNTER — Other Ambulatory Visit: Payer: Self-pay | Admitting: Cardiology

## 2023-01-28 NOTE — Telephone Encounter (Signed)
Called patient's wife Elease Hashimoto and informed her that we have not received a diagnosis verification form for the patient's Repatha from the [employer]. Elease Hashimoto stated that she would reach out to the [facility] and have them re-send the form. Elease Hashimoto had no further questions at this time.

## 2023-01-29 NOTE — Telephone Encounter (Signed)
Patient wife, Elease Hashimoto dropped off diagnosis verification form for Dr. Dulce Sellar to sign and fax back to company. Form given to Richard.

## 2023-01-30 NOTE — Telephone Encounter (Signed)
Called Elease Hashimoto, the patient's wife and verified that she was ok with having Dr. Dulce Sellar sign the patient's diagnosis verification form when he returns on Monday. She stated that would be fine and she had no further questions.

## 2023-03-26 ENCOUNTER — Ambulatory Visit: Payer: Medicare HMO

## 2023-03-26 ENCOUNTER — Other Ambulatory Visit (HOSPITAL_COMMUNITY): Payer: Medicare HMO

## 2023-03-28 NOTE — Progress Notes (Unsigned)
[facility]   MULTIDISCIPLINARY HEART VALVE TEAM  Structural Heart Office Note:  .    Date:  03/31/2023  ID:  Matthew Mcneil, DOB 08-10-1940, MRN [id number] PCP: Matthew Mcneil., MD  [facility] Providers Cardiologist:  Norman Herrlich, MD  History of Present Illness: .   Matthew Mcneil is a 83 y.o. 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   Mr. Matthew Mcneil been followed by Dr. Dulce Sellar for some time. The patient noticed progressive DOE. Serial echocardiograms have demonstrated progression of aortic stenosis to severe with the diminution in ejection fraction. Echo 01/08/22 showed EF 45-50%, and severe AS with mean grad 33.5 mmHg, peak grad 63.8 mmHg, AVA 0.68 cm2, DVI 0.24, mild AI, mild MR, small pericardial effusion. 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 Pre TAVR CTs showed severe AS with anatomy suitable for TAVR as well as gas in the urinary bladder, possible colovesical fistula, renal lesions as well as a left ureteropelvic junction stone.    He was then evaluated by the multidisciplinary valve team and underwent successful TAVR with a 26 mm Edwards Sapien 3 Ultra Resilia THV via the TF approach on 03/26/22 and discharged on ASA 81mg  QD. In follow up he continued to do well with no issues. He was seen by urology and underwent cyctoscopy with urethral catheterization and was discharged 08/23/2023.   He is here today with his wife and reports he feels great with no complaints of chest pain, SOB, palpitations, LE edema, orthopnea, PND, dizziness, or syncope. Denies bleeding in stool or urine.  He was last seen by Dr. Dulce Sellar 09/04/22 and was doing well with no complaints.   Physical Exam:    VS:  BP (!) 152/66   Pulse 77   Ht 5\' 6"  (1.676 m)   Wt 194 lb 9.6 oz (88.3 kg)   SpO2 95%   BMI 31.41 kg/m    Wt Readings from Last 3 Encounters:  03/31/23 194 lb 9.6 oz (88.3 kg)  09/04/22 188 lb 12.8 oz (85.6 kg)  08/23/22 201 lb 1 oz (91.2 kg)    General: Well developed, well nourished, NAD Lungs:Clear to ausculation bilaterally. No wheezes, rales, or rhonchi. Breathing is unlabored. Cardiovascular: RRR with S1 S2. No murmurs Extremities: No edema.  Neuro: Alert and oriented. No focal deficits. No facial asymmetry. MAE spontaneously. Psych: Responds to questions appropriately with normal affect.    ASSESSMENT AND PLAN: .    Severe AS s/p TAVR: Patient doing well with NYHA class I symptoms s/p TAVR. Echo today with LVEF at 55-60% with normal function of the 26mm S3UR TAVR valve with a mean gradient at , peak which are noted to be stable from prior one month post TAVR echo. Continue Asprin 81mg  daily. Plan regular follow up with Dr. Dulce Sellar.    HFrEF: Appears euvolemic today with no changes needed at this time  HTN: BP elevated today however both wife and patient report stable SBP readings on home cuff in the 120-130 range.    CKD stage IIIb: Follows with PCP every three months for labs. Cr appears to be in the 1.6-1.9 range.     Possible colovesical fistula: Follows with urology and is now s/p cystoscopy with bilateral urethral catheterization with  Dr. Lafonda Mosses.  Renal lesions: pre TAVR CT showed "multiple renal lesions bilaterally, including an indeterminate high attenuation lesion extending exophytically from the anterior aspect of the lower pole of the left kidney. Followed by Dr. 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 No  specific follow-up imaging recommended.  I spent 25 minutes caring for this patient today including face-to-face discussions, ordering and reviewing labs, reviewing records from [facility] and other outside facilities, documenting in the record, and arranging for follow up.    Signed, Georgie Chard, NP

## 2023-03-31 ENCOUNTER — Ambulatory Visit: Payer: Medicare HMO | Admitting: Cardiology

## 2023-03-31 ENCOUNTER — Ambulatory Visit: Payer: Medicare HMO | Attending: Cardiology

## 2023-03-31 VITALS — BP 152/66 | HR 77 | Ht 66.0 in | Wt 194.6 lb

## 2023-03-31 DIAGNOSIS — Z952 Presence of prosthetic heart valve: Secondary | ICD-10-CM | POA: Insufficient documentation

## 2023-03-31 DIAGNOSIS — N183 Chronic kidney disease, stage 3 unspecified: Secondary | ICD-10-CM | POA: Insufficient documentation

## 2023-03-31 DIAGNOSIS — N1831 Chronic kidney disease, stage 3a: Secondary | ICD-10-CM | POA: Diagnosis present

## 2023-03-31 DIAGNOSIS — I350 Nonrheumatic aortic (valve) stenosis: Secondary | ICD-10-CM

## 2023-03-31 DIAGNOSIS — I10 Essential (primary) hypertension: Secondary | ICD-10-CM

## 2023-03-31 LAB — ECHOCARDIOGRAM COMPLETE
AR max vel: 1.72 cm2
AV Area VTI: 1.89 cm2
AV Area mean vel: 1.67 cm2
AV Mean grad: 17 mm[Hg]
AV Peak grad: 30.9 mm[Hg]
Ao pk vel: 2.78 m/s
Area-P 1/2: 3.77 cm2
S' Lateral: 3.9 cm

## 2023-03-31 NOTE — Patient Instructions (Signed)
Medication Instructions:  The current medical regimen is effective;  continue present plan and medications.  *If you need a refill on your cardiac medications before your next appointment, please call your pharmacy*  Follow-Up: At [facility], you and your health needs are our priority.  As part of our continuing mission to provide you with exceptional heart care, we have created designated Provider Care Teams.  These Care Teams include your primary Cardiologist (physician) and Advanced Practice Providers (APPs -  Physician Assistants and Nurse Practitioners) who all work together to provide you with the care you need, when you need it.  We recommend signing up for the patient portal called "MyChart".  Sign up information is provided on this After Visit Summary.  MyChart is used to connect with patients for Virtual Visits (Telemedicine).  Patients are able to view lab/test results, encounter notes, upcoming appointments, etc.  Non-urgent messages can be sent to your provider as well.   To learn more about what you can do with MyChart, go to [email or web address].    Your next appointment:   Follow up with your cardiologist as scheduled.

## 2023-04-01 ENCOUNTER — Encounter: Payer: Self-pay | Admitting: Cardiology

## 2023-04-01 DIAGNOSIS — I219 Acute myocardial infarction, unspecified: Secondary | ICD-10-CM | POA: Insufficient documentation

## 2023-04-01 DIAGNOSIS — K602 Anal fissure, unspecified: Secondary | ICD-10-CM | POA: Insufficient documentation

## 2023-04-01 DIAGNOSIS — N200 Calculus of kidney: Secondary | ICD-10-CM | POA: Insufficient documentation

## 2023-04-02 ENCOUNTER — Ambulatory Visit: Payer: Medicare HMO | Admitting: Cardiology

## 2023-04-02 DIAGNOSIS — K602 Anal fissure, unspecified: Secondary | ICD-10-CM

## 2023-04-02 DIAGNOSIS — N200 Calculus of kidney: Secondary | ICD-10-CM

## 2023-04-02 NOTE — Progress Notes (Unsigned)
Cardiology Office Note:    Date:  04/03/2023   ID:  Matthew Mcneil, DOB [birth year], MRN [id number]  PCP:  Gordan Payment., MD  Cardiologist:  Norman Herrlich, MD    Referring MD: Gordan Payment., MD    ASSESSMENT:    1. S/P TAVR (transcatheter aortic valve replacement)   2. Hypertensive heart disease with chronic diastolic congestive heart failure (HCC)   3. Stage 3 chronic kidney disease, unspecified whether stage 3a or 3b CKD (HCC)   4. Coronary artery disease involving native coronary artery of native heart with angina pectoris (HCC)   5. Pure hypercholesterolemia   6. Essential hypertension   7. Statin intolerance     PLAN:    In order of problems listed above:  Lilly continues doing well following TAVR full recovery normal valve function resolution of heart failure pericarditis pericardial effusion. Continue antiplatelet therapy aspirin And fortunately was sent in a request for Repatha and did not list statin intolerance we will resubmit and he can initiate lipid-lowering therapy Stable CKD Hypertension is well-controlled at home he runs less than 130/70 he will give me a list in 2 to 3 weeks daily measurements validated device in good technique   Next appointment: I will plan to see him routinely in the office 1 year   Medication Adjustments/Labs and Tests Ordered: Current medicines are reviewed at length with the patient today.  Concerns regarding medicines are outlined above.  Orders Placed This Encounter  Procedures   EKG 12-Lead   Meds ordered this encounter  Medications   nitroGLYCERIN (NITROSTAT) 0.4 MG SL tablet    Sig: Place 1 tablet (0.4 mg total) under the tongue every 5 (five) minutes as needed for chest pain.    Dispense:  25 tablet    Refill:  3     History of Present Illness:    Matthew Mcneil is a 83 y.o. male with a hx of symptomatic severe aortic stenosis with TAVR 03/26/2022 hypertensive heart disease with CKD coronary artery  disease and hyper lipidemia last seen 09/04/2022.  Following the visit he had an echocardiogram 12/25/2022 which showed normal TAVR function left ventricle with mild LVH normal in size EF 50 to 59% with segmental hypokinesia of the inferior wall right ventricle normal size and function and normal pulmonary artery pressure the left atrium is moderately dilated there is mild mitral regurgitation.  There is no pericardial effusion  He also had a carotid duplex performed with stenosis in the right ICA 60 to 79% left ICA 1 to 39% stable results with known carotid disease.  Recent labs with his PCP on 11/19/2022 hemoglobin 12.8 platelets 155,000 CMP sodium 146 potassium 4.9 GFR 34 cc creatinine 1.92 calcium mildly elevated 10.7 cholesterol 127 LDL 61 non-HDL cholesterol 82  Compliance with diet, lifestyle and medications: Yes  His wife is present participates in evaluation decision making Both very pleased with the quality of his life he feels 10 years younger and is having no cardiovascular symptoms edema shortness of breath chest pain palpitation or syncope He is very pleased with the results of his echocardiogram there is no pericardial fluid. Past Medical History:  Diagnosis Date   Anal fissure    Anemia, chronic disease 04/04/2020   Anxiety 04/29/2016   Atherosclerosis of both carotid arteries 09/15/2018   Formatting of this note might be different from the original. Follows with [facility].   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 Late January 2021.   History of diverticulitis of colon 06/11/2022   History of kidney stones    Hyperlipidemia 04/28/2015   Hyperparathyroidism (HCC) 04/28/2015   Kidney mass 05/08/2022   Formatting of this note might be different from the original. [facility] 2024     Kidney stone    Myalgia due to statin 05/08/2022   Myocardial infarction [facility])    Nonrheumatic aortic valve stenosis 09/15/2018   Formatting of this note might be different from the original. Follows with [facility].   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   allopurinol (ZYLOPRIM) 100 MG tablet Take 100 mg by mouth 2 (two) times daily.   amLODipine (NORVASC) 5 MG tablet Take 1 tablet (5 mg total) by mouth daily.   amoxicillin (AMOXIL) 500 MG capsule Take 2,000 mg by mouth as directed. Take (4) capsules - prior to dental procedure   Ascorbic Acid (VITAMIN C) 500 MG CAPS Take 500 mg by mouth at bedtime.   aspirin EC 81 MG tablet Take 1 tablet (81 mg total) by mouth daily.   escitalopram (LEXAPRO) 10 MG tablet Take 10 mg by mouth in the morning.   Evolocumab (REPATHA SURECLICK) 140 MG/ML SOAJ Inject 140 mg into the skin every 14 (fourteen) days.   finasteride (PROSCAR) 5 MG tablet Take 5 mg by mouth at bedtime.   furosemide (LASIX) 40 MG tablet Take 20-40 mg by mouth See admin instructions. Take 40 mg by mouth  in the morning & take 20 mg by mouth in the evening.   latanoprost (XALATAN) 0.005 % ophthalmic solution Place 1 drop into both eyes at bedtime.   olmesartan (BENICAR) 40 MG tablet Take 40 mg by mouth in the morning.   Omega-3 Fatty Acids (FISH OIL) 1000 MG CAPS Take 1,000 mg by mouth 2 (two) times daily.   Psyllium (METAMUCIL) 28.3 % POWD Take 6 g by mouth in the morning. 8 oz of Water (2 teaspoons)   traMADol (ULTRAM) 50 MG tablet Take 1-2 tablets (50-100 mg total) by mouth every 6 (six) hours as needed for moderate pain or severe pain.   vitamin B-12 (CYANOCOBALAMIN) 500 MCG tablet Take 500 mcg by mouth every other day.   [DISCONTINUED] nitroGLYCERIN (NITROSTAT) 0.4 MG SL tablet Place 0.4 mg under the tongue every 5 (five) minutes as needed for chest pain.      EKGs/Labs/Other Studies Reviewed:    The following studies were reviewed today:  Cardiac Studies &  Procedures   CARDIAC CATHETERIZATION  CARDIAC CATHETERIZATION 02/20/2022  Narrative 1.  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 2.  Capacious iliofemoral vessels of the right side. 3.  Normal cardiac output of 7 L/min and cardiac index of 3.6 L/min/m with mean RA pressure of 3 mmHg and mean wedge pressure of 10 mmHg.  Recommendation: Continue evaluation for aortic valve intervention.  Findings Coronary Findings Diagnostic  Dominance: Left  Left Anterior Descending There is mild diffuse disease throughout the vessel.  Left Circumflex The vessel exhibits minimal luminal irregularities.  Right Coronary Artery Vessel is small. There is mild diffuse disease throughout the vessel.  Intervention  No interventions have been documented.    ECHOCARDIOGRAM  ECHOCARDIOGRAM COMPLETE 03/31/2023  Narrative ECHOCARDIOGRAM REPORT    Patient Name:   Matthew Mcneil Date of Exam: 03/31/2023 Medical Rec #:  [id number]            Height:       66.0 in Accession #:    [id number]            Weight:       188.8 lb Date of Birth:  1940/05/05            BSA:          1.951 m Patient Age:    82 years             BP:           132/60 mmHg Patient Gender: M                    HR:           76 bpm. Exam Location:  [address]  Procedure: 2D Echo, Cardiac Doppler, Color Doppler and 3D Echo  Indications:    S/P TAVR Z95.2  History:        Patient has prior history of Echocardiogram examinations, most recent 12/25/2022. CAD; Risk Factors:Hypertension and Dyslipidemia. Aortic Valve: 26 mm Edwards Sapien prosthetic, stented (TAVR) valve is present in the aortic position. Procedure Date: 03/26/2022.  Sonographer:    Thurman Coyer RDCS Referring Phys: [phone] JILL D MCDANIEL  IMPRESSIONS   1. Left ventricular ejection fraction, by estimation, is 55 to 60%. The left ventricle has normal function. The left ventricle has no regional wall motion abnormalities. There is mild concentric left ventricular hypertrophy. Left ventricular diastolic parameters are consistent with Grade I diastolic dysfunction (impaired relaxation). 2. Right ventricular systolic function is normal. The right ventricular size is normal. There is mildly elevated pulmonary artery systolic pressure. 3. The mitral valve is normal in structure. Trivial mitral valve regurgitation. No evidence of mitral stenosis. 4. The aortic valve has been repaired/replaced. Aortic valve regurgitation is not visualized. No aortic stenosis is present. There is a 26 mm Edwards Sapien prosthetic (TAVR) valve present in the aortic position. Procedure Date: 03/26/2022. Echo findings are consistent with normal structure and function of the aortic valve prosthesis. Aortic valve mean gradient measures 17.0 mmHg. Aortic valve Vmax measures 2.78 m/s. 5. Aortic dilatation noted. There is mild dilatation of the ascending aorta, measuring 41 mm. 6. The inferior vena cava is normal in size with greater than 50% respiratory variability, suggesting  right atrial pressure of 3 mmHg.  Comparison(s): Peak and mean gradients stable from prior echocardiogram, LVEF improved.  FINDINGS Left Ventricle: Left ventricular ejection fraction, by estimation, is 55 to 60%. The left ventricle has normal function. The left ventricle has no regional wall motion  abnormalities. 3D ejection fraction reviewed and evaluated as part of the interpretation. Alternate measurement of EF is felt to be most reflective of LV function. The left ventricular internal cavity size was normal in size. There is mild concentric left ventricular hypertrophy. Left ventricular diastolic parameters are consistent with Grade I diastolic dysfunction (impaired relaxation).  Right Ventricle: The right ventricular size is normal. No increase in right ventricular wall thickness. Right ventricular systolic function is normal. There is mildly elevated pulmonary artery systolic pressure. The tricuspid regurgitant velocity is 2.77 m/s, and with an assumed right atrial pressure of 3 mmHg, the estimated right ventricular systolic pressure is 33.7 mmHg.  Left Atrium: Left atrial size was normal in size.  Right Atrium: Right atrial size was normal in size.  Pericardium: There is no evidence of pericardial effusion.  Mitral Valve: The mitral valve is normal in structure. Trivial mitral valve regurgitation. No evidence of mitral valve stenosis.  Tricuspid Valve: The tricuspid valve is normal in structure. Tricuspid valve regurgitation is not demonstrated. No evidence of tricuspid stenosis.  Aortic Valve: The aortic valve has been repaired/replaced. Aortic valve regurgitation is not visualized. No aortic stenosis is present. Aortic valve mean gradient measures 17.0 mmHg. Aortic valve peak gradient measures 30.9 mmHg. Aortic valve area, by VTI measures 1.89 cm. There is a 26 mm Edwards Sapien prosthetic, stented (TAVR) valve present in the aortic position. Procedure Date: 03/26/2022. Echo findings  are consistent with normal structure and function of the aortic valve prosthesis.  Pulmonic Valve: The pulmonic valve was normal in structure. Pulmonic valve regurgitation is mild to moderate. No evidence of pulmonic stenosis.  Aorta: Aortic dilatation noted. There is mild dilatation of the ascending aorta, measuring 41 mm.  Venous: The inferior vena cava is normal in size with greater than 50% respiratory variability, suggesting right atrial pressure of 3 mmHg.  IAS/Shunts: No atrial level shunt detected by color flow Doppler.   LEFT VENTRICLE PLAX 2D LVIDd:         4.90 cm   Diastology LVIDs:         3.90 cm   LV e' medial:    5.52 cm/s LV PW:         1.00 cm   LV E/e' medial:  21.8 LV IVS:        1.10 cm   LV e' lateral:   11.70 cm/s LVOT diam:     2.40 cm   LV E/e' lateral: 10.3 LV SV:         101 LV SV Index:   52 LVOT Area:     4.52 cm  3D Volume EF: 3D EF:        49 % LV EDV:       169 ml LV ESV:       86 ml LV SV:        83 ml  RIGHT VENTRICLE             IVC RV Basal diam:  4.00 cm     IVC diam: 1.50 cm RV Mid diam:    3.20 cm RV S prime:     22.20 cm/s TAPSE (M-mode): 2.0 cm  LEFT ATRIUM             Index        RIGHT ATRIUM           Index LA diam:        4.70 cm 2.41 cm/m   RA Area:  14.90 cm LA Vol (A2C):   37.3 ml 19.11 ml/m  RA Volume:   28.10 ml  14.40 ml/m LA Vol (A4C):   56.2 ml 28.80 ml/m LA Biplane Vol: 47.1 ml 24.14 ml/m AORTIC VALVE AV Area (Vmax):    1.72 cm AV Area (Vmean):   1.67 cm AV Area (VTI):     1.89 cm AV Vmax:           278.00 cm/s AV Vmean:          191.000 cm/s AV VTI:            0.535 m AV Peak Grad:      30.9 mmHg AV Mean Grad:      17.0 mmHg LVOT Vmax:         106.00 cm/s LVOT Vmean:        70.700 cm/s LVOT VTI:          0.223 m LVOT/AV VTI ratio: 0.42  AORTA Ao Root diam: 3.00 cm Ao Asc diam:  4.10 cm  MITRAL VALVE                TRICUSPID VALVE MV Area (PHT): 3.77 cm     TR Peak grad:   30.7 mmHg MV Decel  Time: 201 msec     TR Vmax:        277.00 cm/s MV E velocity: 120.50 cm/s MV A velocity: 126.00 cm/s  SHUNTS MV E/A ratio:  0.96         Systemic VTI:  0.22 m Systemic Diam: 2.40 cm  Clearnce Hasten Electronically signed by Clearnce Hasten Signature Date/Time: 03/31/2023/11:57:41 AM    Final    CT SCANS  CT CORONARY MORPH W/CTA COR W/SCORE 03/08/2022  Addendum 03/14/2022  6:47 AM ADDENDUM REPORT: 03/14/2022 06:45  ADDENDUM: Extracardiac findings were described separately under dictation for contemporaneously obtained CTA chest, abdomen and pelvis dated 03/08/2022, please see that report for full description of relevant extracardiac findings.   Electronically Signed By: Trudie Reed M.D. On: 03/14/2022 06:45  Narrative CLINICAL DATA:  Severe Aortic Stenosis.  EXAM: Cardiac TAVR CT  TECHNIQUE: A non-contrast, gated CT scan was obtained with axial slices of 3 mm through the heart for aortic valve calcium scoring. A 120 kV retrospective, gated, contrast cardiac scan was obtained. Gantry rotation speed was 250 msecs and collimation was 0.6 mm. Nitroglycerin was not given. The 3D data set was reconstructed in 5% intervals of the 0-95% of the R-R cycle. Systolic and diastolic phases were analyzed on a dedicated workstation using MPR, MIP, and VRT modes. The patient received 100 cc of contrast.  FINDINGS: Image quality: Excellent.  Noise artifact is: Limited.  Valve Morphology: Tricuspid aortic valve with diffuse severe calcifications. Severely restricted leaflet movement in systole.  Aortic Valve Calcium score: 1458  Aortic annular dimension:  Phase assessed: 15%  Annular area: 452 mm2  Annular perimeter: 76.5 mm  Max diameter: 26.0 mm  Min diameter: 23.0 mm  Annular and subannular calcification: None.  Membranous septum length: 8.9 mm  Optimal coplanar projection: LAO 21 CAU 13  Coronary Artery Height above Annulus:  Left Main: 11.0 mm  Right  Coronary: 18.7 mm  Sinus of Valsalva Measurements:  Non-coronary: 32 mm  Right-coronary: 31 mm  Left-coronary: 33 mm  Sinus of Valsalva Height:  Non-coronary: 21.2 mm  Right-coronary: 22.9 mm  Left-coronary: 21.7 mm  Sinotubular Junction: 29 mm  Ascending Thoracic Aorta: 36 mm  Coronary Arteries: Normal coronary origin. Left dominance.  The study was performed without use of NTG and is insufficient for plaque evaluation. Please refer to recent cardiac catheterization for coronary assessment.  Cardiac Morphology:  Right Atrium: Right atrial size is within normal limits.  Right Ventricle: The right ventricular cavity is within normal limits.  Left Atrium: Left atrial size is normal in size with no left atrial appendage filling defect.  Left Ventricle: The ventricular cavity size is within normal limits.  Pulmonary arteries: Dilated pulmonary artery suggestive of pulmonary hypertension.  Pulmonary veins: Normal pulmonary venous drainage.  Pericardium: Normal thickness with no significant effusion or calcium present.  Mitral Valve: The mitral valve is normal structure without significant calcification.  Extra-cardiac findings: See attached radiology report for non-cardiac structures.  IMPRESSION: 1. Annular measurements favor a 26 mm S3 or 29 mm Evolut Pro.  2. No significant annular or subannular calcifications.  3. Sufficient coronary to annulus distance.  4. Optimal Fluoroscopic Angle for Delivery: LAO 21 CAU 13  5. 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  Gerri Spore T. Flora Lipps, MD  Electronically Signed: By: Lennie Odor M.D. On: 03/10/2022 16:22          EKG Interpretation Date/Time:  Thursday April 03 2023 14:08:48 EST Ventricular Rate:  77 PR Interval:  176 QRS Duration:  100 QT Interval:  380 QTC Calculation: 430 R Axis:   26  Text Interpretation: Normal sinus rhythm Normal ECG When compared with ECG of  27-Mar-2022 05:21, Premature ventricular complexes are no longer Present Premature atrial complexes are no longer Present Confirmed by Norman Herrlich ([id number]) on 04/03/2023 2:11:46 PM   Recent Labs: 08/22/2022: BUN 24; Magnesium 1.9; Platelets 103; Sodium 139 08/23/2022: Creatinine, Ser 1.90; Hemoglobin 10.0; Potassium 4.3  Recent Lipid Panel    Component Value Date/Time   CHOL 121 01/16/2021 0857   TRIG 172 (H) 01/16/2021 0857   HDL 44 01/16/2021 0857   CHOLHDL 2.8 01/16/2021 0857   LDLCALC 48 01/16/2021 0857    Physical Exam:    VS:  BP (!) 166/94   Pulse 77   Ht 5\' 6"  (1.676 m)   Wt 195 lb 9.6 oz (88.7 kg)   SpO2 95%   BMI 31.57 kg/m     Wt Readings from Last 3 Encounters:  04/03/23 195 lb 9.6 oz (88.7 kg)  03/31/23 194 lb 9.6 oz (88.3 kg)  09/04/22 188 lb 12.8 oz (85.6 kg)     GEN:  Well nourished, well developed in no acute distress HEENT: Normal NECK: No JVD; No carotid bruits LYMPHATICS: No lymphadenopathy CARDIAC: Soft 1 of 6 flow murmur no aortic regurgitation RRR, no murmurs, rubs, gallops RESPIRATORY:  Clear to auscultation without rales, wheezing or rhonchi  ABDOMEN: Soft, non-tender, non-distended MUSCULOSKELETAL:  No edema; No deformity  SKIN: Warm and dry NEUROLOGIC:  Alert and oriented x 3 PSYCHIATRIC:  Normal affect    Signed, Norman Herrlich, MD  04/03/2023 2:13 PM    [facility]

## 2023-04-02 NOTE — Progress Notes (Deleted)
Cardiology Office Note:    Date:  04/02/2023   ID:  Matthew Mcneil, DOB 02/11/1941, MRN [id number]  PCP:  Gordan Payment., MD  Cardiologist:  Norman Herrlich, MD    Referring MD: Gordan Payment., MD    ASSESSMENT:    1. Anal fissure   2. Kidney stone    PLAN:    In order of problems listed above:  ***   Next appointment: ***   Medication Adjustments/Labs and Tests Ordered: Current medicines are reviewed at length with the patient today.  Concerns regarding medicines are outlined above.  No orders of the defined types were placed in this encounter.  No orders of the defined types were placed in this encounter.    History of Present Illness:    Matthew Mcneil is a 83 y.o. 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  Following the visit he had an echocardiogram 12/25/2022 which showed normal TAVR function left ventricle with mild LVH normal in size EF 50 to 59% with segmental hypokinesia of the inferior wall right ventricle normal size and function and normal pulmonary artery pressure the left atrium is moderately dilated there is mild mitral regurgitation.  He also had a carotid duplex performed with stenosis in the right ICA 60 to 79% left ICA 1 to 39% stable results with known carotid disease.  Reason labs with his PCP on 11/19/2022 hemoglobin 12.8 platelets 155,000 CMP sodium 146 potassium 4.9 GFR 34 cc creatinine 1.92 calcium mildly elevated 10.7 cholesterol 127 LDL 61 non-HDL cholesterol 82 Compliance with diet, lifestyle and medications: *** Past Medical History:  Diagnosis Date   Anal fissure    Anemia, chronic disease 04/04/2020   Anxiety 04/29/2016   Atherosclerosis of both carotid arteries 09/15/2018   Formatting of this note might be different from the original. Follows with [facility]   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 Late January 2021.   History of diverticulitis of colon 06/11/2022   History of kidney stones    Hyperlipidemia 04/28/2015   Hyperparathyroidism (HCC) 04/28/2015   Kidney mass 05/08/2022   Formatting of this note might be different from the original. [facility] 2024     Kidney stone    Myalgia due to statin 05/08/2022   Myocardial infarction [facility])    Nonrheumatic aortic valve stenosis 09/15/2018   Formatting of this note might be different from the original. [facility] with [facility].   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      EKGs/Labs/Other Studies Reviewed:    The following studies were reviewed today:  Cardiac Studies & Procedures   CARDIAC CATHETERIZATION  CARDIAC CATHETERIZATION 02/20/2022  Narrative 1.  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 2.  Capacious iliofemoral vessels of the right side. 3.   Normal cardiac output of 7 L/min and cardiac index of 3.6 L/min/m with mean RA pressure of 3 mmHg and mean wedge pressure of 10 mmHg.  Recommendation: Continue evaluation for aortic valve intervention.  Findings Coronary Findings Diagnostic  Dominance: Left  Left Anterior Descending There is mild diffuse disease throughout the vessel.  Left Circumflex The vessel exhibits minimal luminal irregularities.  Right Coronary Artery Vessel is small. There is mild diffuse disease throughout the vessel.  Intervention  No interventions have been documented.    ECHOCARDIOGRAM  ECHOCARDIOGRAM COMPLETE 03/31/2023  Narrative ECHOCARDIOGRAM REPORT    Patient Name:   Matthew Mcneil Date of Exam: 03/31/2023 Medical Rec #:  [id number]            Height:       66.0 in Accession #:    [id number]           Weight:       188.8 lb Date of Birth:  01-24-1941            BSA:          1.951 m Patient Age:    82 years             BP:           132/60 mmHg Patient Gender: M                    HR:           76 bpm. Exam Location:  [address]  Procedure: 2D Echo, Cardiac Doppler, Color Doppler and 3D Echo  Indications:    S/P TAVR Z95.2  History:        Patient has prior history of Echocardiogram examinations, most recent 12/25/2022. CAD; Risk Factors:Hypertension and Dyslipidemia. Aortic Valve: 26 mm Edwards Sapien prosthetic, stented (TAVR) valve is present in the aortic position. Procedure Date: 03/26/2022.  Sonographer:    Thurman Coyer RDCS Referring Phys: [phone] JILL D MCDANIEL  IMPRESSIONS   1. Left ventricular ejection fraction, by estimation, is 55 to 60%. The left ventricle has normal function. The left ventricle has no regional wall motion abnormalities. There is mild concentric left ventricular hypertrophy. Left ventricular diastolic parameters are consistent with Grade I diastolic dysfunction (impaired relaxation). 2. Right ventricular systolic function is normal. The  right ventricular size is normal. There is mildly elevated pulmonary artery systolic pressure. 3. The mitral valve is normal in structure. Trivial mitral valve regurgitation. No evidence of mitral stenosis. 4. The aortic valve has been repaired/replaced. Aortic valve regurgitation is not visualized. No aortic stenosis is present. There is a 26 mm Edwards Sapien prosthetic (TAVR) valve present in the aortic position. Procedure Date: 03/26/2022. Echo findings are consistent with normal structure and function of the aortic valve prosthesis. Aortic valve mean gradient measures 17.0 mmHg. Aortic valve Vmax measures 2.78 m/s. 5. Aortic dilatation noted. There is mild dilatation of the ascending aorta, measuring 41 mm. 6. The inferior vena cava is normal in size with greater than 50% respiratory variability, suggesting right atrial pressure of 3  mmHg.  Comparison(s): Peak and mean gradients stable from prior echocardiogram, LVEF improved.  FINDINGS Left Ventricle: Left ventricular ejection fraction, by estimation, is 55 to 60%. The left ventricle has normal function. The left ventricle has no regional wall motion abnormalities. 3D ejection fraction reviewed and evaluated as part of the interpretation. Alternate measurement of EF is felt to be most reflective of LV function. The left ventricular internal cavity size was normal in size. There is mild concentric left ventricular hypertrophy. Left ventricular diastolic parameters are consistent with Grade I diastolic dysfunction (impaired relaxation).  Right Ventricle: The right ventricular size is normal. No increase in right ventricular wall thickness. Right ventricular systolic function is normal. There is mildly elevated pulmonary artery systolic pressure. The tricuspid regurgitant velocity is 2.77 m/s, and with an assumed right atrial pressure of 3 mmHg, the estimated right ventricular systolic pressure is 33.7 mmHg.  Left Atrium: Left atrial size was  normal in size.  Right Atrium: Right atrial size was normal in size.  Pericardium: There is no evidence of pericardial effusion.  Mitral Valve: The mitral valve is normal in structure. Trivial mitral valve regurgitation. No evidence of mitral valve stenosis.  Tricuspid Valve: The tricuspid valve is normal in structure. Tricuspid valve regurgitation is not demonstrated. No evidence of tricuspid stenosis.  Aortic Valve: The aortic valve has been repaired/replaced. Aortic valve regurgitation is not visualized. No aortic stenosis is present. Aortic valve mean gradient measures 17.0 mmHg. Aortic valve peak gradient measures 30.9 mmHg. Aortic valve area, by VTI measures 1.89 cm. There is a 26 mm Edwards Sapien prosthetic, stented (TAVR) valve present in the aortic position. Procedure Date: 03/26/2022. Echo findings are consistent with normal structure and function of the aortic valve prosthesis.  Pulmonic Valve: The pulmonic valve was normal in structure. Pulmonic valve regurgitation is mild to moderate. No evidence of pulmonic stenosis.  Aorta: Aortic dilatation noted. There is mild dilatation of the ascending aorta, measuring 41 mm.  Venous: The inferior vena cava is normal in size with greater than 50% respiratory variability, suggesting right atrial pressure of 3 mmHg.  IAS/Shunts: No atrial level shunt detected by color flow Doppler.   LEFT VENTRICLE PLAX 2D LVIDd:         4.90 cm   Diastology LVIDs:         3.90 cm   LV e' medial:    5.52 cm/s LV PW:         1.00 cm   LV E/e' medial:  21.8 LV IVS:        1.10 cm   LV e' lateral:   11.70 cm/s LVOT diam:     2.40 cm   LV E/e' lateral: 10.3 LV SV:         101 LV SV Index:   52 LVOT Area:     4.52 cm  3D Volume EF: 3D EF:        49 % LV EDV:       169 ml LV ESV:       86 ml LV SV:        83 ml  RIGHT VENTRICLE             IVC RV Basal diam:  4.00 cm     IVC diam: 1.50 cm RV Mid diam:    3.20 cm RV S prime:     22.20 cm/s TAPSE  (M-mode): 2.0 cm  LEFT ATRIUM  Index        RIGHT ATRIUM           Index LA diam:        4.70 cm 2.41 cm/m   RA Area:     14.90 cm LA Vol (A2C):   37.3 ml 19.11 ml/m  RA Volume:   28.10 ml  14.40 ml/m LA Vol (A4C):   56.2 ml 28.80 ml/m LA Biplane Vol: 47.1 ml 24.14 ml/m AORTIC VALVE AV Area (Vmax):    1.72 cm AV Area (Vmean):   1.67 cm AV Area (VTI):     1.89 cm AV Vmax:           278.00 cm/s AV Vmean:          191.000 cm/s AV VTI:            0.535 m AV Peak Grad:      30.9 mmHg AV Mean Grad:      17.0 mmHg LVOT Vmax:         106.00 cm/s LVOT Vmean:        70.700 cm/s LVOT VTI:          0.223 m LVOT/AV VTI ratio: 0.42  AORTA Ao Root diam: 3.00 cm Ao Asc diam:  4.10 cm  MITRAL VALVE                TRICUSPID VALVE MV Area (PHT): 3.77 cm     TR Peak grad:   30.7 mmHg MV Decel Time: 201 msec     TR Vmax:        277.00 cm/s MV E velocity: 120.50 cm/s MV A velocity: 126.00 cm/s  SHUNTS MV E/A ratio:  0.96         Systemic VTI:  0.22 m Systemic Diam: 2.40 cm  Clearnce Hasten Electronically signed by Clearnce Hasten Signature Date/Time: 03/31/2023/11:57:41 AM    Final    CT SCANS  CT CORONARY MORPH W/CTA COR W/SCORE 03/08/2022  Addendum 03/14/2022  6:47 AM ADDENDUM REPORT: 03/14/2022 06:45  ADDENDUM: Extracardiac findings were described separately under dictation for contemporaneously obtained CTA chest, abdomen and pelvis dated 03/08/2022, please see that report for full description of relevant extracardiac findings.   Electronically Signed By: Trudie Reed M.D. On: 03/14/2022 06:45  Narrative CLINICAL DATA:  Severe Aortic Stenosis.  EXAM: Cardiac TAVR CT  TECHNIQUE: A non-contrast, gated CT scan was obtained with axial slices of 3 mm through the heart for aortic valve calcium scoring. A 120 kV retrospective, gated, contrast cardiac scan was obtained. Gantry rotation speed was 250 msecs and collimation was 0.6 mm. Nitroglycerin was  not given. The 3D data set was reconstructed in 5% intervals of the 0-95% of the R-R cycle. Systolic and diastolic phases were analyzed on a dedicated workstation using MPR, MIP, and VRT modes. The patient received 100 cc of contrast.  FINDINGS: Image quality: Excellent.  Noise artifact is: Limited.  Valve Morphology: Tricuspid aortic valve with diffuse severe calcifications. Severely restricted leaflet movement in systole.  Aortic Valve Calcium score: 1458  Aortic annular dimension:  Phase assessed: 15%  Annular area: 452 mm2  Annular perimeter: 76.5 mm  Max diameter: 26.0 mm  Min diameter: 23.0 mm  Annular and subannular calcification: None.  Membranous septum length: 8.9 mm  Optimal coplanar projection: LAO 21 CAU 13  Coronary Artery Height above Annulus:  Left Main: 11.0 mm  Right Coronary: 18.7 mm  Sinus of Valsalva Measurements:  Non-coronary: 32 mm  Right-coronary: 31  mm  Left-coronary: 33 mm  Sinus of Valsalva Height:  Non-coronary: 21.2 mm  Right-coronary: 22.9 mm  Left-coronary: 21.7 mm  Sinotubular Junction: 29 mm  Ascending Thoracic Aorta: 36 mm  Coronary Arteries: Normal coronary origin. Left dominance. The study was performed without use of NTG and is insufficient for plaque evaluation. Please refer to recent cardiac catheterization for coronary assessment.  Cardiac Morphology:  Right Atrium: Right atrial size is within normal limits.  Right Ventricle: The right ventricular cavity is within normal limits.  Left Atrium: Left atrial size is normal in size with no left atrial appendage filling defect.  Left Ventricle: The ventricular cavity size is within normal limits.  Pulmonary arteries: Dilated pulmonary artery suggestive of pulmonary hypertension.  Pulmonary veins: Normal pulmonary venous drainage.  Pericardium: Normal thickness with no significant effusion or calcium present.  Mitral Valve: The mitral valve is normal  structure without significant calcification.  Extra-cardiac findings: See attached radiology report for non-cardiac structures.  IMPRESSION: 1. Annular measurements favor a 26 mm S3 or 29 mm Evolut Pro.  2. No significant annular or subannular calcifications.  3. Sufficient coronary to annulus distance.  4. Optimal Fluoroscopic Angle for Delivery: LAO 21 CAU 13  5. 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  Gerri Spore T. Flora Lipps, MD  Electronically Signed: By: Lennie Odor M.D. On: 03/10/2022 16:22              Recent Labs: 08/22/2022: BUN 24; Magnesium 1.9; Platelets 103; Sodium 139 08/23/2022: Creatinine, Ser 1.90; Hemoglobin 10.0; Potassium 4.3  Recent Lipid Panel    Component Value Date/Time   CHOL 121 01/16/2021 0857   TRIG 172 (H) 01/16/2021 0857   HDL 44 01/16/2021 0857   CHOLHDL 2.8 01/16/2021 0857   LDLCALC 48 01/16/2021 0857    Physical Exam:    VS:  There were no vitals taken for this visit.    Wt Readings from Last 3 Encounters:  03/31/23 194 lb 9.6 oz (88.3 kg)  09/04/22 188 lb 12.8 oz (85.6 kg)  08/23/22 201 lb 1 oz (91.2 kg)     GEN: *** Well nourished, well developed in no acute distress HEENT: Normal NECK: No JVD; No carotid bruits LYMPHATICS: No lymphadenopathy CARDIAC: ***RRR, no murmurs, rubs, gallops RESPIRATORY:  Clear to auscultation without rales, wheezing or rhonchi  ABDOMEN: Soft, non-tender, non-distended MUSCULOSKELETAL:  No edema; No deformity  SKIN: Warm and dry NEUROLOGIC:  Alert and oriented x 3 PSYCHIATRIC:  Normal affect    Signed, Norman Herrlich, MD  04/02/2023 9:30 AM    [facility]

## 2023-04-03 ENCOUNTER — Encounter: Payer: Self-pay | Admitting: Cardiology

## 2023-04-03 ENCOUNTER — Ambulatory Visit: Payer: Medicare HMO | Attending: Cardiology | Admitting: Cardiology

## 2023-04-03 VITALS — BP 166/94 | HR 77 | Ht 66.0 in | Wt 195.6 lb

## 2023-04-03 DIAGNOSIS — Z952 Presence of prosthetic heart valve: Secondary | ICD-10-CM | POA: Diagnosis not present

## 2023-04-03 DIAGNOSIS — I10 Essential (primary) hypertension: Secondary | ICD-10-CM | POA: Diagnosis not present

## 2023-04-03 DIAGNOSIS — I110 Hypertensive heart disease with heart failure: Secondary | ICD-10-CM

## 2023-04-03 DIAGNOSIS — I25119 Atherosclerotic heart disease of native coronary artery with unspecified angina pectoris: Secondary | ICD-10-CM

## 2023-04-03 DIAGNOSIS — N183 Chronic kidney disease, stage 3 unspecified: Secondary | ICD-10-CM

## 2023-04-03 DIAGNOSIS — Z789 Other specified health status: Secondary | ICD-10-CM

## 2023-04-03 DIAGNOSIS — E7800 Pure hypercholesterolemia, unspecified: Secondary | ICD-10-CM

## 2023-04-03 DIAGNOSIS — I5032 Chronic diastolic (congestive) heart failure: Secondary | ICD-10-CM

## 2023-04-03 MED ORDER — NITROGLYCERIN 0.4 MG SL SUBL: 0.4000 mg | SUBLINGUAL_TABLET | SUBLINGUAL | 3 refills | Status: AC | PRN

## 2023-04-03 NOTE — Patient Instructions (Signed)
Medication Instructions:  Your physician recommends that you continue on your current medications as directed. Please refer to the Current Medication list given to you today.  *If you need a refill on your cardiac medications before your next appointment, please call your pharmacy*   Lab Work: None If you have labs (blood work) drawn today and your tests are completely normal, you will receive your results only by: MyChart Message (if you have MyChart) OR A paper copy in the mail If you have any lab test that is abnormal or we need to change your treatment, we will call you to review the results.   Testing/Procedures: None   Follow-Up: At [facility], you and your health needs are our priority.  As part of our continuing mission to provide you with exceptional heart care, we have created designated Provider Care Teams.  These Care Teams include your primary Cardiologist (physician) and Advanced Practice Providers (APPs -  Physician Assistants and Nurse Practitioners) who all work together to provide you with the care you need, when you need it.  We recommend signing up for the patient portal called "MyChart".  Sign up information is provided on this After Visit Summary.  MyChart is used to connect with patients for Virtual Visits (Telemedicine).  Patients are able to view lab/test results, encounter notes, upcoming appointments, etc.  Non-urgent messages can be sent to your provider as well.   To learn more about what you can do with MyChart, go to [email or web address].    Your next appointment:   1 year(s)  Provider:   Norman Herrlich, MD    Other Instructions Check and record blood pressures daily for 2 weeks. After 2 weeks send a list of blood pressures to the office.

## 2023-07-25 ENCOUNTER — Other Ambulatory Visit: Payer: Self-pay | Admitting: Cardiology

## 2023-07-25 DIAGNOSIS — I6521 Occlusion and stenosis of right carotid artery: Secondary | ICD-10-CM

## 2023-07-25 DIAGNOSIS — I25119 Atherosclerotic heart disease of native coronary artery with unspecified angina pectoris: Secondary | ICD-10-CM

## 2023-07-25 DIAGNOSIS — E7800 Pure hypercholesterolemia, unspecified: Secondary | ICD-10-CM

## 2023-09-15 ENCOUNTER — Telehealth: Payer: Self-pay | Admitting: Cardiology

## 2023-09-15 NOTE — Telephone Encounter (Signed)
 Spoke with spouse per DPR. She stated that the pt's BP and HR was low for a few days. She felt it was a combination of the heat and BP medication. He has stopped the Amlodipine  for a few days and is feeling better. His BP and Hr was back in the normal range this morning. Advised to drink plenty of fluids and to monitor his BP and HR readings for a week and bring or send those readings in for the MD to review and decide about the Amlodipine . Spouse agreed and verbalized understanding and had no further questions. She will call if there are any changes in his readings or condition.

## 2023-09-15 NOTE — Telephone Encounter (Signed)
 Pt c/o medication issue:  1. Name of Medication:   amLODipine  (NORVASC ) 5 MG tablet    2. How are you currently taking this medication (dosage and times per day)? Stopped taking  3. Are you having a reaction (difficulty breathing--STAT)? Dizziness, low bp  4. What is your medication issue? Wife stopped the medication and he feels better

## 2023-12-03 ENCOUNTER — Ambulatory Visit: Attending: Cardiology

## 2023-12-03 DIAGNOSIS — I6521 Occlusion and stenosis of right carotid artery: Secondary | ICD-10-CM | POA: Diagnosis not present

## 2023-12-04 ENCOUNTER — Ambulatory Visit: Payer: Self-pay | Admitting: Cardiology

## 2024-01-03 ENCOUNTER — Other Ambulatory Visit: Payer: Self-pay | Admitting: Cardiology

## 2024-01-03 DIAGNOSIS — I6521 Occlusion and stenosis of right carotid artery: Secondary | ICD-10-CM

## 2024-01-03 DIAGNOSIS — E7800 Pure hypercholesterolemia, unspecified: Secondary | ICD-10-CM

## 2024-01-03 DIAGNOSIS — I25119 Atherosclerotic heart disease of native coronary artery with unspecified angina pectoris: Secondary | ICD-10-CM

## 2024-02-02 DIAGNOSIS — E7800 Pure hypercholesterolemia, unspecified: Secondary | ICD-10-CM | POA: Insufficient documentation

## 2024-02-02 DIAGNOSIS — I10 Essential (primary) hypertension: Secondary | ICD-10-CM | POA: Insufficient documentation

## 2024-02-02 NOTE — Progress Notes (Unsigned)
 Cardiology Office Note:    Date:  02/03/2024   ID:  Matthew Mcneil, DOB 10/29/1940, MRN [id number]  PCP:  Thurmond Cathlyn LABOR., MD  Cardiologist:  Redell Leiter, MD    Referring MD: Thurmond Cathlyn LABOR., MD    ASSESSMENT:    1. Bradycardia   2. Essential hypertension   3. S/P TAVR (transcatheter aortic valve replacement)   4. Hypertensive heart disease with chronic diastolic congestive heart failure (HCC)   5. Stage 3 chronic kidney disease, unspecified whether stage 3a or 3b CKD (HCC)   6. Pure hypercholesterolemia    PLAN:    In order of problems listed above:  I think the predominant problem here was symptomatic hyperkalemia has been addressed If potassium remains elevated we will need to discontinue his ARB and if additional antihypertensive therapy is needed if thiazide diuretic or metolazone can waste urinary potassium along with furosemide  Alternate antihypertensive hydralazine  or resume his calcium channel blocker Heart failure is compensated continue his current diuretic Continue his lipid-lowering treatment  1 weeks ZIO monitor to assess for higher grade bradycardia heart block   Next appointment: I will plan to see 6 months   Medication Adjustments/Labs and Tests Ordered: Current medicines are reviewed at length with the patient today.  Concerns regarding medicines are outlined above.  Orders Placed This Encounter  Procedures   EKG 12-Lead   No orders of the defined types were placed in this encounter.    History of Present Illness:    Matthew Mcneil is a 83 y.o. male with a hx of symptomatic severe aortic stenosis with TAVR 03/26/2022 hypertensive heart disease with CKD coronary artery disease and hyperlipidemia last seen 04/03/2023.  He was referred back to the office today because of bradycardia  and hypotension by his primary physician.  He had a TAVR surveillance echocardiogram January 2025 normal left ventricular function no indication of amyloidosis  normal TAVR function.  He had wellness exam performed 01/28/2024 blood pressure recorded at 151/72 pulse 83 bpm.  His wife noted heart rates were low and he is actually being sent here for consideration of an event monitor.  He had discontinued amlodipine  treatment with hypotension.  Compliance with diet, lifestyle and medications: Yes  Visit is prompted by symptomatic hyperkalemia she tells me his potassium was 6.3 has been taking Kayexalate now for 5 days last day today and he had a heart rate in the range of 40 was profoundly weak she stopped his amlodipine . Fortunately did not have syncope no chest pain shortness of breath. He is going to have a repeat BMP If potassium remains elevated I think we should stop his ARB and if hypertension needs additional treatment add metolazone to waist urinary potassium I will give him a list of foods to avoid Past Medical History:  Diagnosis Date   Anal fissure    Anemia, chronic disease 04/04/2020   Anxiety 04/29/2016   Atherosclerosis of both carotid arteries 09/15/2018   Formatting of this note might be different from the original. Follows with [facility].   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 Late January 2021.   History of diverticulitis of colon 06/11/2022   History of kidney stones    Hyperlipidemia 04/28/2015   Hyperparathyroidism 04/28/2015   Kidney mass 05/08/2022   Formatting of this note might be  different from the original. [facility] 2024     Kidney stone    Myalgia due to statin 05/08/2022   Myocardial infarction [facility])    Nonrheumatic aortic valve stenosis 09/15/2018   Formatting of this note might be different from the original. Follows with [facility].   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 2.  Capacious iliofemoral vessels of the right side. 3.  Normal cardiac output of 7 L/min and cardiac index of 3.6 L/min/m with mean RA pressure of 3 mmHg and mean wedge pressure of 10 mmHg.  Recommendation: Continue evaluation for aortic valve intervention.  Findings Coronary Findings Diagnostic  Dominance: Left  Left Anterior Descending There is mild diffuse disease throughout the vessel.  Left Circumflex The vessel exhibits minimal luminal irregularities.  Right Coronary Artery Vessel is small. There is mild diffuse disease throughout the vessel.  Intervention  No interventions have been documented.     ECHOCARDIOGRAM  ECHOCARDIOGRAM COMPLETE 03/31/2023  Narrative ECHOCARDIOGRAM  REPORT    Patient Name:   Matthew Mcneil Date of Exam: 03/31/2023 Medical Rec #:  [id number]            Height:       66.0 in Accession #:    [id number]           Weight:       188.8 lb Date of Birth:  30-Mar-1940            BSA:          1.951 m Patient Age:    82 years             BP:           132/60 mmHg Patient Gender: M                    HR:           76 bpm. Exam Location:  [address]  Procedure: 2D Echo, Cardiac Doppler, Color Doppler and 3D Echo  Indications:    S/P TAVR Z95.2  History:        Patient has prior history of Echocardiogram examinations, most recent 12/25/2022. CAD; Risk Factors:Hypertension and Dyslipidemia. Aortic Valve: 26 mm Edwards Sapien prosthetic, stented (TAVR) valve is present in the aortic position. Procedure Date: 03/26/2022.  Sonographer:    Augustin Seals RDCS Referring Phys: [phone] JILL D  MCDANIEL  IMPRESSIONS   1. Left ventricular ejection fraction, by estimation, is 55 to 60%. The left ventricle has normal function. The left ventricle has no regional wall motion abnormalities. There is mild concentric left ventricular hypertrophy. Left ventricular diastolic parameters are consistent with Grade I diastolic dysfunction (impaired relaxation). 2. Right ventricular systolic function is normal. The right ventricular size is normal. There is mildly elevated pulmonary artery systolic pressure. 3. The mitral valve is normal in structure. Trivial mitral valve regurgitation. No evidence of mitral stenosis. 4. The aortic valve has been repaired/replaced. Aortic valve regurgitation is not visualized. No aortic stenosis is present. There is a 26 mm Edwards Sapien prosthetic (TAVR) valve present in the aortic position. Procedure Date: 03/26/2022. Echo findings are consistent with normal structure and function of the aortic valve prosthesis. Aortic valve mean gradient measures 17.0 mmHg. Aortic valve Vmax measures 2.78 m/s. 5. Aortic dilatation noted.  There is mild dilatation of the ascending aorta, measuring 41 mm. 6. The inferior vena cava is normal in size with greater than 50% respiratory variability, suggesting right atrial pressure of 3 mmHg.  Comparison(s): Peak and mean gradients stable from prior echocardiogram, LVEF improved.  FINDINGS Left Ventricle: Left ventricular ejection fraction, by estimation, is 55 to 60%. The left ventricle has normal function. The left ventricle has no regional wall motion abnormalities. 3D ejection fraction reviewed and evaluated as part of the interpretation. Alternate measurement of EF is felt to be most reflective of LV function. The left ventricular internal cavity size was normal in size. There is mild concentric left ventricular hypertrophy. Left ventricular diastolic parameters are consistent with Grade I diastolic dysfunction (impaired relaxation).  Right Ventricle: The right ventricular size is normal. No increase in right ventricular wall thickness. Right ventricular systolic function is normal. There is mildly elevated pulmonary artery systolic pressure. The tricuspid regurgitant velocity is 2.77 m/s, and with an assumed right atrial pressure of 3 mmHg, the estimated right ventricular systolic pressure is 33.7 mmHg.  Left Atrium: Left atrial size was normal in size.  Right Atrium: Right atrial size was normal in size.  Pericardium: There is no evidence of pericardial effusion.  Mitral Valve: The mitral valve is normal in structure. Trivial mitral valve regurgitation. No evidence of mitral valve stenosis.  Tricuspid Valve: The tricuspid valve is normal in structure. Tricuspid valve regurgitation is not demonstrated. No evidence of tricuspid stenosis.  Aortic Valve: The aortic valve has been repaired/replaced. Aortic valve regurgitation is not visualized. No aortic stenosis is present. Aortic valve mean gradient measures 17.0 mmHg. Aortic valve peak gradient measures 30.9 mmHg. Aortic valve  area, by VTI measures 1.89 cm. There is a 26 mm Edwards Sapien prosthetic, stented (TAVR) valve present in the aortic position. Procedure Date: 03/26/2022. Echo findings are consistent with normal structure and function of the aortic valve prosthesis.  Pulmonic Valve: The pulmonic valve was normal in structure. Pulmonic valve regurgitation is mild to moderate. No evidence of pulmonic stenosis.  Aorta: Aortic dilatation noted. There is mild dilatation of the ascending aorta, measuring 41 mm.  Venous: The inferior vena cava is normal in size with greater than 50% respiratory variability, suggesting right atrial pressure of 3 mmHg.  IAS/Shunts: No atrial level shunt detected by color flow Doppler.   LEFT VENTRICLE PLAX 2D LVIDd:         4.90 cm   Diastology LVIDs:         3.90 cm   LV e' medial:    5.52 cm/s  LV PW:         1.00 cm   LV E/e' medial:  21.8 LV IVS:        1.10 cm   LV e' lateral:   11.70 cm/s LVOT diam:     2.40 cm   LV E/e' lateral: 10.3 LV SV:         101 LV SV Index:   52 LVOT Area:     4.52 cm  3D Volume EF: 3D EF:        49 % LV EDV:       169 ml LV ESV:       86 ml LV SV:        83 ml  RIGHT VENTRICLE             IVC RV Basal diam:  4.00 cm     IVC diam: 1.50 cm RV Mid diam:    3.20 cm RV S prime:     22.20 cm/s TAPSE (M-mode): 2.0 cm  LEFT ATRIUM             Index        RIGHT ATRIUM           Index LA diam:        4.70 cm 2.41 cm/m   RA Area:     14.90 cm LA Vol (A2C):   37.3 ml 19.11 ml/m  RA Volume:   28.10 ml  14.40 ml/m LA Vol (A4C):   56.2 ml 28.80 ml/m LA Biplane Vol: 47.1 ml 24.14 ml/m AORTIC VALVE AV Area (Vmax):    1.72 cm AV Area (Vmean):   1.67 cm AV Area (VTI):     1.89 cm AV Vmax:           278.00 cm/s AV Vmean:          191.000 cm/s AV VTI:            0.535 m AV Peak Grad:      30.9 mmHg AV Mean Grad:      17.0 mmHg LVOT Vmax:         106.00 cm/s LVOT Vmean:        70.700 cm/s LVOT VTI:          0.223 m LVOT/AV VTI ratio:  0.42  AORTA Ao Root diam: 3.00 cm Ao Asc diam:  4.10 cm  MITRAL VALVE                TRICUSPID VALVE MV Area (PHT): 3.77 cm     TR Peak grad:   30.7 mmHg MV Decel Time: 201 msec     TR Vmax:        277.00 cm/s MV E velocity: 120.50 cm/s MV A velocity: 126.00 cm/s  SHUNTS MV E/A ratio:  0.96         Systemic VTI:  0.22 m Systemic Diam: 2.40 cm  Morene Brownie Electronically signed by Morene Brownie Signature Date/Time: 03/31/2023/11:57:41 AM    Final      CT SCANS  CT CORONARY MORPH W/CTA COR W/SCORE 03/08/2022  Addendum 03/14/2022  6:47 AM ADDENDUM REPORT: 03/14/2022 06:45  ADDENDUM: Extracardiac findings were described separately under dictation for contemporaneously obtained CTA chest, abdomen and pelvis dated 03/08/2022, please see that report for full description of relevant extracardiac findings.   Electronically Signed By: Toribio Aye M.D. On: 03/14/2022 06:45  Narrative CLINICAL DATA:  Severe Aortic Stenosis.  EXAM: Cardiac TAVR CT  TECHNIQUE: A  non-contrast, gated CT scan was obtained with axial slices of 3 mm through the heart for aortic valve calcium scoring. A 120 kV retrospective, gated, contrast cardiac scan was obtained. Gantry rotation speed was 250 msecs and collimation was 0.6 mm. Nitroglycerin  was not given. The 3D data set was reconstructed in 5% intervals of the 0-95% of the R-R cycle. Systolic and diastolic phases were analyzed on a dedicated workstation using MPR, MIP, and VRT modes. The patient received 100 cc of contrast.  FINDINGS: Image quality: Excellent.  Noise artifact is: Limited.  Valve Morphology: Tricuspid aortic valve with diffuse severe calcifications. Severely restricted leaflet movement in systole.  Aortic Valve Calcium score: 1458  Aortic annular dimension:  Phase assessed: 15%  Annular area: 452 mm2  Annular perimeter: 76.5 mm  Max diameter: 26.0 mm  Min diameter: 23.0 mm  Annular and  subannular calcification: None.  Membranous septum length: 8.9 mm  Optimal coplanar projection: LAO 21 CAU 13  Coronary Artery Height above Annulus:  Left Main: 11.0 mm  Right Coronary: 18.7 mm  Sinus of Valsalva Measurements:  Non-coronary: 32 mm  Right-coronary: 31 mm  Left-coronary: 33 mm  Sinus of Valsalva Height:  Non-coronary: 21.2 mm  Right-coronary: 22.9 mm  Left-coronary: 21.7 mm  Sinotubular Junction: 29 mm  Ascending Thoracic Aorta: 36 mm  Coronary Arteries: Normal coronary origin. Left dominance. The study was performed without use of NTG and is insufficient for plaque evaluation. Please refer to recent cardiac catheterization for coronary assessment.  Cardiac Morphology:  Right Atrium: Right atrial size is within normal limits.  Right Ventricle: The right ventricular cavity is within normal limits.  Left Atrium: Left atrial size is normal in size with no left atrial appendage filling defect.  Left Ventricle: The ventricular cavity size is within normal limits.  Pulmonary arteries: Dilated pulmonary artery suggestive of pulmonary hypertension.  Pulmonary veins: Normal pulmonary venous drainage.  Pericardium: Normal thickness with no significant effusion or calcium present.  Mitral Valve: The mitral valve is normal structure without significant calcification.  Extra-cardiac findings: See attached radiology report for non-cardiac structures.  IMPRESSION: 1. Annular measurements favor a 26 mm S3 or 29 mm Evolut Pro.  2. No significant annular or subannular calcifications.  3. Sufficient coronary to annulus distance.  4. Optimal Fluoroscopic Angle for Delivery: LAO 21 CAU 13  5. 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  Darryle T. Barbaraann, MD  Electronically Signed: By: Darryle Barbaraann M.D. On: 03/10/2022 16:22      ______________________________________________________________________________________________      EKG Interpretation Date/Time:  Tuesday February 03 2024 08:28:04 EST Ventricular Rate:  90 PR Interval:  224 QRS Duration:  92 QT Interval:  348 QTC Calculation: 425 R Axis:   10  Text Interpretation: Sinus rhythm with 1st degree A-V block with occasional Premature ventricular complexes Nonspecific T wave abnormality When compared with ECG of 03-Apr-2023 14:08, Premature ventricular complexes are now Present PR interval has increased Confirmed by Monetta Rogue ([id number]) on 02/03/2024 8:36:58 AM   Recent Labs: No results found for requested labs within last 365 days.  Recent Lipid Panel    Component Value Date/Time   CHOL 121 01/16/2021 0857   TRIG 172 (H) 01/16/2021 0857   HDL 44 01/16/2021 0857   CHOLHDL 2.8 01/16/2021 0857   LDLCALC 48 01/16/2021 0857   EKG Interpretation Date/Time:  Tuesday February 03 2024 08:28:04 EST Ventricular Rate:  90 PR Interval:  224 QRS Duration:  92 QT Interval:  348 QTC Calculation:  425 R Axis:   10  Text Interpretation: Sinus rhythm with 1st degree A-V block with occasional Premature ventricular complexes Nonspecific T wave abnormality When compared with ECG of 03-Apr-2023 14:08, Premature ventricular complexes are now Present PR interval has increased Confirmed by Monetta Rogue ([id number]) on 02/03/2024 8:36:58 AM    Physical Exam:    VS:  BP (!) 160/90   Pulse 90   Ht 5' 6 (1.676 m)   Wt 192 lb (87.1 kg)   SpO2 97%   BMI 30.99 kg/m     Wt Readings from Last 3 Encounters:  02/03/24 192 lb (87.1 kg)  04/03/23 195 lb 9.6 oz (88.7 kg)  03/31/23 194 lb 9.6 oz (88.3 kg)     GEN: He does not look acutely ill well nourished, well developed in no acute distress HEENT: Normal NECK: No JVD; No carotid bruits LYMPHATICS: No lymphadenopathy CARDIAC: RRR, no murmurs, rubs, gallops RESPIRATORY:  Clear to auscultation without rales, wheezing  or rhonchi  ABDOMEN: Soft, non-tender, non-distended MUSCULOSKELETAL:  No edema; No deformity  SKIN: Warm and dry NEUROLOGIC:  Alert and oriented x 3 PSYCHIATRIC:  Normal affect    Signed, Rogue Monetta, MD  02/03/2024 8:39 AM    [facility]

## 2024-02-03 ENCOUNTER — Ambulatory Visit: Attending: Cardiology | Admitting: Cardiology

## 2024-02-03 ENCOUNTER — Encounter: Payer: Self-pay | Admitting: Cardiology

## 2024-02-03 ENCOUNTER — Ambulatory Visit (INDEPENDENT_AMBULATORY_CARE_PROVIDER_SITE_OTHER)

## 2024-02-03 VITALS — BP 160/90 | HR 90 | Ht 66.0 in | Wt 192.0 lb

## 2024-02-03 DIAGNOSIS — R001 Bradycardia, unspecified: Secondary | ICD-10-CM | POA: Diagnosis not present

## 2024-02-03 DIAGNOSIS — I110 Hypertensive heart disease with heart failure: Secondary | ICD-10-CM

## 2024-02-03 DIAGNOSIS — E7800 Pure hypercholesterolemia, unspecified: Secondary | ICD-10-CM

## 2024-02-03 DIAGNOSIS — Z952 Presence of prosthetic heart valve: Secondary | ICD-10-CM | POA: Diagnosis not present

## 2024-02-03 DIAGNOSIS — N183 Chronic kidney disease, stage 3 unspecified: Secondary | ICD-10-CM

## 2024-02-03 DIAGNOSIS — I10 Essential (primary) hypertension: Secondary | ICD-10-CM | POA: Diagnosis not present

## 2024-02-03 DIAGNOSIS — I5032 Chronic diastolic (congestive) heart failure: Secondary | ICD-10-CM

## 2024-02-03 NOTE — Patient Instructions (Signed)
 Medication Instructions:  Your physician recommends that you continue on your current medications as directed. Please refer to the Current Medication list given to you today.  *If you need a refill on your cardiac medications before your next appointment, please call your pharmacy*  Lab Work: None If you have labs (blood work) drawn today and your tests are completely normal, you will receive your results only by: MyChart Message (if you have MyChart) OR A paper copy in the mail If you have any lab test that is abnormal or we need to change your treatment, we will call you to review the results.  Testing/Procedures: A zio monitor was ordered today. It will remain on for 7 days. You will then return monitor and event diary in provided box. It takes 1-2 weeks for report to be downloaded and returned to us . We will call you with the results. If monitor falls off or has orange flashing light, please call Zio for further instructions.    Follow-Up: At [facility], you and your health needs are our priority.  As part of our continuing mission to provide you with exceptional heart care, our providers are all part of one team.  This team includes your primary Cardiologist (physician) and Advanced Practice Providers or APPs (Physician Assistants and Nurse Practitioners) who all work together to provide you with the care you need, when you need it.  Your next appointment:   6 month(s)  Provider:   Redell Leiter, MD    We recommend signing up for the patient portal called MyChart.  Sign up information is provided on this After Visit Summary.  MyChart is used to connect with patients for Virtual Visits (Telemedicine).  Patients are able to view lab/test results, encounter notes, upcoming appointments, etc.  Non-urgent messages can be sent to your provider as well.   To learn more about what you can do with MyChart, go to [email or web address].   Other Instructions None

## 2024-02-18 ENCOUNTER — Other Ambulatory Visit: Payer: Self-pay

## 2024-02-18 ENCOUNTER — Ambulatory Visit: Payer: Self-pay | Admitting: Cardiology

## 2024-02-18 DIAGNOSIS — R001 Bradycardia, unspecified: Secondary | ICD-10-CM

## 2024-02-21 ENCOUNTER — Encounter: Payer: Self-pay | Admitting: Cardiology

## 2024-02-21 LAB — BASIC METABOLIC PANEL WITH GFR
BUN/Creatinine Ratio: 20 (ref 10–24)
BUN: 34 mg/dL — ABNORMAL HIGH (ref 8–27)
CO2: 21 mmol/L (ref 20–29)
Calcium: 10.6 mg/dL — ABNORMAL HIGH (ref 8.6–10.2)
Chloride: 106 mmol/L (ref 96–106)
Creatinine, Ser: 1.68 mg/dL — ABNORMAL HIGH (ref 0.76–1.27)
Glucose: 102 mg/dL — ABNORMAL HIGH (ref 70–99)
Potassium: 4.6 mmol/L (ref 3.5–5.2)
Sodium: 144 mmol/L (ref 134–144)
eGFR: 40 mL/min/1.73 — ABNORMAL LOW (ref >59)

## 2024-02-21 LAB — MAGNESIUM: Magnesium: 2.3 mg/dL (ref 1.6–2.3)

## 2024-03-28 NOTE — Progress Notes (Unsigned)
 " Cardiology Office Note:    Date:  03/30/2024   ID:  Matthew Mcneil, DOB 02-02-1941, MRN [id number]  PCP:  Thurmond Cathlyn LABOR., MD  Cardiologist:  Redell Leiter, MD    Referring MD: Thurmond Cathlyn LABOR., MD    ASSESSMENT:    1. S/P TAVR (transcatheter aortic valve replacement)   2. Hypertensive heart disease with chronic diastolic congestive heart failure (HCC)   3. Stage 3 chronic kidney disease, unspecified whether stage 3a or 3b CKD (HCC)   4. Hyperkalemia   5. Bradycardia   6. Pure hypercholesterolemia   7. Statin intolerance   8. Bilateral carotid artery stenosis    PLAN:    In order of problems listed above:  Matthew Mcneil continues to do well long-term after TAVR good functional durable result recheck his screening TAVR echocardiogram Stable not fluid overloaded [place]  Heart Association class I continue his current loop diuretic along with his ARB. Stable CKD and hyperkalemia upcoming labs he is careful with diet over-the-counter substitutes and potassium Stable sick sinus syndrome on no medications to suppress his heart rhythm Continue his current nonstatin therapy has upcoming labs with his PCP Stable carotid disease recent duplex September 2025 plan to repeat in about a year   Next appointment: 9 months   Medication Adjustments/Labs and Tests Ordered: Current medicines are reviewed at length with the patient today.  Concerns regarding medicines are outlined above.  No orders of the defined types were placed in this encounter.  No orders of the defined types were placed in this encounter.    History of Present Illness:    Matthew Mcneil is a 84 y.o. male with a hx of severe symptomatic aortic stenosis with TAVR January 2024 hypertensive heart disease with CKD complicated by hyperkalemia CAD and hyper lipidemia last seen 02/03/2024.  Following the visit he had an event monitor performed showing no severe bradycardia.  Recent labs 03/09/2024 creatinine 1.78 GFR 37  cc/min potassium 5.0 sodium 145.  Carotid duplex September 2025 showed right ICA stenosis 60 to 79% left ICA stenosis 40 to 59%.  His last surveillance TAVR echocardiogram January 2025.  Compliance with diet, lifestyle and medications: Yes  His wife is present participates in evaluation decision making He is care for the diet avoiding fresh fruits and juices he takes no over-the-counter potassium or sodium substitutes From a cardiac perspective he is feeling well and is not having edema shortness of breath chest pain palpitation or syncope He has had no fever or chills He has upcoming labs in a few weeks with his PCP I told his wife if he has recurrent hyperkalemia we could provide him with a prescription for [facility] so they continue his cardiac medications including an ARB Past Medical History:  Diagnosis Date   Anal fissure    Anemia, chronic disease 04/04/2020   Anxiety 04/29/2016   Atherosclerosis of both carotid arteries 09/15/2018   Formatting of this note might be different from the original. Follows with [facility].   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 Late January 2021.   History of diverticulitis of colon 06/11/2022   History of kidney stones    Hyperlipidemia 04/28/2015   Hyperparathyroidism 04/28/2015   Kidney mass 05/08/2022   Formatting of this note might be different from the original. [facility] 2024     Kidney stone     Myalgia due to statin 05/08/2022   Myocardial infarction [facility])    Nonrheumatic aortic valve stenosis 09/15/2018   Formatting of this note might be different from the original. Follows with [facility].   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 2.  Capacious iliofemoral vessels of the right side. 3.  Normal cardiac output of 7 L/min and cardiac index of 3.6 L/min/m with mean RA pressure of 3 mmHg and mean wedge pressure of 10 mmHg.  Recommendation: Continue evaluation for aortic valve intervention.  Findings Coronary Findings Diagnostic  Dominance: Left  Left Anterior Descending There is mild diffuse disease throughout the vessel.  Left Circumflex The vessel exhibits minimal luminal irregularities.  Right Coronary Artery Vessel is small. There is mild diffuse disease throughout the vessel.  Intervention  No interventions have been documented.     ECHOCARDIOGRAM  ECHOCARDIOGRAM COMPLETE 03/31/2023  Narrative ECHOCARDIOGRAM REPORT    Patient Name:   Matthew Mcneil Date of Exam: 03/31/2023 Medical Rec #:  [id number]            Height:       66.0 in Accession #:    [id number]           Weight:       188.8  lb Date of Birth:  07-05-1940            BSA:          1.951 m Patient Age:    82 years             BP:           132/60 mmHg Patient Gender: M                    HR:           76 bpm. Exam Location:  [address]  Procedure: 2D Echo, Cardiac Doppler, Color Doppler and 3D Echo  Indications:    S/P TAVR Z95.2  History:        Patient has prior history of Echocardiogram examinations, most recent 12/25/2022. CAD; Risk Factors:Hypertension and Dyslipidemia. Aortic Valve: 26 mm Edwards Sapien prosthetic, stented (TAVR) valve is present in the aortic position. Procedure Date: 03/26/2022.  Sonographer:    Matthew Mcneil RDCS Referring Phys: [phone] Matthew Mcneil  IMPRESSIONS   1. Left ventricular ejection fraction, by estimation, is 55 to 60%. The left ventricle has normal function. The left ventricle has no regional wall motion  abnormalities. There is mild concentric left ventricular hypertrophy. Left ventricular diastolic parameters are consistent with Grade I diastolic dysfunction (impaired relaxation). 2. Right ventricular systolic function is normal. The right ventricular size is normal. There is mildly elevated pulmonary artery systolic pressure. 3. The mitral valve is normal in structure. Trivial mitral valve regurgitation. No evidence of mitral stenosis. 4. The aortic valve has been repaired/replaced. Aortic valve regurgitation is not visualized. No aortic stenosis is present. There is a 26 mm Edwards Sapien prosthetic (TAVR) valve present in the aortic position. Procedure Date: 03/26/2022. Echo findings are consistent with normal structure and function of the aortic valve prosthesis. Aortic valve mean gradient measures 17.0 mmHg. Aortic valve Vmax measures 2.78 m/s. 5. Aortic dilatation noted. There is mild dilatation of the ascending aorta, measuring 41 mm. 6. The inferior vena cava is normal in size with greater than 50% respiratory variability, suggesting right atrial pressure of  3 mmHg.  Comparison(s): Peak and mean gradients stable from prior echocardiogram, LVEF improved.  FINDINGS Left Ventricle: Left ventricular ejection fraction, by estimation, is 55 to 60%. The left ventricle has normal function. The left ventricle has no regional wall motion abnormalities. 3D ejection fraction reviewed and evaluated as part of the interpretation. Alternate measurement of EF is felt to be most reflective of LV function. The left ventricular internal cavity size was normal in size. There is mild concentric left ventricular hypertrophy. Left ventricular diastolic parameters are consistent with Grade I diastolic dysfunction (impaired relaxation).  Right Ventricle: The right ventricular size is normal. No increase in right ventricular wall thickness. Right ventricular systolic function is normal. There is mildly elevated pulmonary artery systolic pressure. The tricuspid regurgitant velocity is 2.77 m/s, and with an assumed right atrial pressure of 3 mmHg, the estimated right ventricular systolic pressure is 33.7 mmHg.  Left Atrium: Left atrial size was normal in size.  Right Atrium: Right atrial size was normal in size.  Pericardium: There is no evidence of pericardial effusion.  Mitral Valve: The mitral valve is normal in structure. Trivial mitral valve regurgitation. No evidence of mitral valve stenosis.  Tricuspid Valve: The tricuspid valve is normal in structure. Tricuspid valve regurgitation is not demonstrated. No evidence of tricuspid stenosis.  Aortic Valve: The aortic valve has been repaired/replaced. Aortic valve regurgitation is not visualized. No aortic stenosis is present. Aortic valve mean gradient measures 17.0 mmHg. Aortic valve peak gradient measures 30.9 mmHg. Aortic valve area, by VTI measures 1.89 cm. There is a 26 mm Edwards Sapien prosthetic, stented (TAVR) valve present in the aortic position. Procedure Date: 03/26/2022. Echo findings are consistent with  normal structure and function of the aortic valve prosthesis.  Pulmonic Valve: The pulmonic valve was normal in structure. Pulmonic valve regurgitation is mild to moderate. No evidence of pulmonic stenosis.  Aorta: Aortic dilatation noted. There is mild dilatation of the ascending aorta, measuring 41 mm.  Venous: The inferior vena cava is normal in size with greater than 50% respiratory variability, suggesting right atrial pressure of 3 mmHg.  IAS/Shunts: No atrial level shunt detected by color flow Doppler.   LEFT VENTRICLE PLAX 2D LVIDd:         4.90 cm   Diastology LVIDs:         3.90 cm   LV e' medial:    5.52 cm/s LV PW:         1.00 cm   LV E/e' medial:  21.8 LV IVS:        1.10 cm  LV e' lateral:   11.70 cm/s LVOT diam:     2.40 cm   LV E/e' lateral: 10.3 LV SV:         101 LV SV Index:   52 LVOT Area:     4.52 cm  3D Volume EF: 3D EF:        49 % LV EDV:       169 ml LV ESV:       86 ml LV SV:        83 ml  RIGHT VENTRICLE             IVC RV Basal diam:  4.00 cm     IVC diam: 1.50 cm RV Mid diam:    3.20 cm RV S prime:     22.20 cm/s TAPSE (M-mode): 2.0 cm  LEFT ATRIUM             Index        RIGHT ATRIUM           Index LA diam:        4.70 cm 2.41 cm/m   RA Area:     14.90 cm LA Vol (A2C):   37.3 ml 19.11 ml/m  RA Volume:   28.10 ml  14.40 ml/m LA Vol (A4C):   56.2 ml 28.80 ml/m LA Biplane Vol: 47.1 ml 24.14 ml/m AORTIC VALVE AV Area (Vmax):    1.72 cm AV Area (Vmean):   1.67 cm AV Area (VTI):     1.89 cm AV Vmax:           278.00 cm/s AV Vmean:          191.000 cm/s AV VTI:            0.535 m AV Peak Grad:      30.9 mmHg AV Mean Grad:      17.0 mmHg LVOT Vmax:         106.00 cm/s LVOT Vmean:        70.700 cm/s LVOT VTI:          0.223 m LVOT/AV VTI ratio: 0.42  AORTA Ao Root diam: 3.00 cm Ao Asc diam:  4.10 cm  MITRAL VALVE                TRICUSPID VALVE MV Area (PHT): 3.77 cm     TR Peak grad:   30.7 mmHg MV Decel Time: 201 msec      TR Vmax:        277.00 cm/s MV E velocity: 120.50 cm/s MV A velocity: 126.00 cm/s  SHUNTS MV E/A ratio:  0.96         Systemic VTI:  0.22 m Systemic Diam: 2.40 cm  Matthew Mcneil Electronically signed by Matthew Mcneil Signature Date/Time: 03/31/2023/11:57:41 AM    Final    MONITORS  LONG TERM MONITOR (3-14 DAYS) 02/17/2024  Narrative Patch Wear Time:  6 days and 22 hours (2025-11-25T09:16:55-0500 to 2025-12-02T07:45:10-498)  Patient had a min HR of 54 bpm, max HR of 194 bpm, and avg HR of 79 bpm. Predominant underlying rhythm was Sinus Rhythm. First Degree AV Block was present.  There were no symptomatic events noted.  There were no episodes of sinus pauses 3 seconds or greater or second or third-degree AV nodal block.  14 Ventricular Tachycardia runs occurred, the run with the fastest interval lasting 5 beats with a max rate of 194 bpm, the longest lasting 17 beats accelerated idioventricular rhythm with an  avg rate of 117 bpm.  18 Supraventricular Tachycardia runs occurred, the run with the fastest interval lasting 7 beats with a max rate of 145 bpm, the longest lasting 17 beats with an avg rate of 114 bpm.  Isolated SVEs were rare (<1.0%), SVE Couplets were rare (<1.0%), and SVE Triplets were rare (<1.0%). Isolated VEs were occasional (1.8%, 13761),  VE Couplets were rare (<1.0%, 500), and VE Triplets were rare (<1.0%, 32). Ventricular Bigeminy and Trigeminy were present.   CT SCANS  CT CORONARY MORPH W/CTA COR W/SCORE 03/08/2022  Addendum 03/14/2022  6:47 AM ADDENDUM REPORT: 03/14/2022 06:45  ADDENDUM: Extracardiac findings were described separately under dictation for contemporaneously obtained CTA chest, abdomen and pelvis dated 03/08/2022, please see that report for full description of relevant extracardiac findings.   Electronically Signed By: Matthew Mcneil M.D. On: 03/14/2022 06:45  Narrative CLINICAL DATA:  Severe Aortic Stenosis.  EXAM: Cardiac  TAVR CT  TECHNIQUE: A non-contrast, gated CT scan was obtained with axial slices of 3 mm through the heart for aortic valve calcium scoring. A 120 kV retrospective, gated, contrast cardiac scan was obtained. Gantry rotation speed was 250 msecs and collimation was 0.6 mm. Nitroglycerin  was not given. The 3D data set was reconstructed in 5% intervals of the 0-95% of the R-R cycle. Systolic and diastolic phases were analyzed on a dedicated workstation using MPR, MIP, and VRT modes. The patient received 100 cc of contrast.  FINDINGS: Image quality: Excellent.  Noise artifact is: Limited.  Valve Morphology: Tricuspid aortic valve with diffuse severe calcifications. Severely restricted leaflet movement in systole.  Aortic Valve Calcium score: 1458  Aortic annular dimension:  Phase assessed: 15%  Annular area: 452 mm2  Annular perimeter: 76.5 mm  Max diameter: 26.0 mm  Min diameter: 23.0 mm  Annular and subannular calcification: None.  Membranous septum length: 8.9 mm  Optimal coplanar projection: LAO 21 CAU 13  Coronary Artery Height above Annulus:  Left Main: 11.0 mm  Right Coronary: 18.7 mm  Sinus of Valsalva Measurements:  Non-coronary: 32 mm  Right-coronary: 31 mm  Left-coronary: 33 mm  Sinus of Valsalva Height:  Non-coronary: 21.2 mm  Right-coronary: 22.9 mm  Left-coronary: 21.7 mm  Sinotubular Junction: 29 mm  Ascending Thoracic Aorta: 36 mm  Coronary Arteries: Normal coronary origin. Left dominance. The study was performed without use of NTG and is insufficient for plaque evaluation. Please refer to recent cardiac catheterization for coronary assessment.  Cardiac Morphology:  Right Atrium: Right atrial size is within normal limits.  Right Ventricle: The right ventricular cavity is within normal limits.  Left Atrium: Left atrial size is normal in size with no left atrial appendage filling defect.  Left Ventricle: The ventricular cavity  size is within normal limits.  Pulmonary arteries: Dilated pulmonary artery suggestive of pulmonary hypertension.  Pulmonary veins: Normal pulmonary venous drainage.  Pericardium: Normal thickness with no significant effusion or calcium present.  Mitral Valve: The mitral valve is normal structure without significant calcification.  Extra-cardiac findings: See attached radiology report for non-cardiac structures.  IMPRESSION: 1. Annular measurements favor a 26 mm S3 or 29 mm Evolut Pro.  2. No significant annular or subannular calcifications.  3. Sufficient coronary to annulus distance.  4. Optimal Fluoroscopic Angle for Delivery: LAO 21 CAU 13  5. 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  Matthew T. Barbaraann, MD  Electronically Signed: By: Matthew Mcneil M.D. On: 03/10/2022 16:22     ______________________________________________________________________________________________  Recent Labs: 02/20/2024: BUN 34; Creatinine, Ser 1.68; Magnesium  2.3; Potassium 4.6; Sodium 144  Recent Lipid Panel    Component Value Date/Time   CHOL 121 01/16/2021 0857   TRIG 172 (H) 01/16/2021 0857   HDL 44 01/16/2021 0857   CHOLHDL 2.8 01/16/2021 0857   LDLCALC 48 01/16/2021 0857    Physical Exam:    VS:  BP 130/84   Pulse 82   Ht 5' 6 (1.676 m)   Wt 193 lb 9.6 oz (87.8 kg)   SpO2 97%   BMI 31.25 kg/m     Wt Readings from Last 3 Encounters:  03/30/24 193 lb 9.6 oz (87.8 kg)  02/03/24 192 lb (87.1 kg)  04/03/23 195 lb 9.6 oz (88.7 kg)     GEN:  Well nourished, well developed in no acute distress HEENT: Normal NECK: No JVD; No carotid bruits LYMPHATICS: No lymphadenopathy CARDIAC: RRR, no murmurs, rubs, gallops RESPIRATORY:  Clear to auscultation without rales, wheezing or rhonchi  ABDOMEN: Soft, non-tender, non-distended MUSCULOSKELETAL:  No edema; No deformity  SKIN: Warm and dry NEUROLOGIC:  Alert and oriented x 3 PSYCHIATRIC:   Normal affect    Signed, Redell Leiter, MD  03/30/2024 9:44 AM    [facility]      [1]  Current Meds  Medication Sig   acetaminophen  (TYLENOL ) 500 MG tablet Take 500-1,000 mg by mouth every 6 (six) hours as needed for moderate pain.   allopurinol  (ZYLOPRIM ) 100 MG tablet Take 100 mg by mouth 2 (two) times daily.   amoxicillin  (AMOXIL ) 500 MG capsule Take 2,000 mg by mouth as directed. Take (4) capsules - prior to dental procedure   Ascorbic Acid  (VITAMIN C ) 500 MG CAPS Take 500 mg by mouth at bedtime.   aspirin  EC 81 MG tablet Take 1 tablet (81 mg total) by mouth daily.   escitalopram  (LEXAPRO ) 10 MG tablet Take 10 mg by mouth in the morning.   Evolocumab  (REPATHA  SURECLICK) 140 MG/ML SOAJ INJECT 140MG  INTO THE SKIN EVERY 14 DAYS   finasteride  (PROSCAR ) 5 MG tablet Take 5 mg by mouth at bedtime.   furosemide  (LASIX ) 40 MG tablet Take 20-40 mg by mouth See admin instructions. Take 40 mg by mouth in the morning & take 20 mg by mouth in the evening.   latanoprost  (XALATAN ) 0.005 % ophthalmic solution Place 1 drop into both eyes at bedtime.   nitroGLYCERIN  (NITROSTAT ) 0.4 MG SL tablet Place 1 tablet (0.4 mg total) under the tongue every 5 (five) minutes as needed for chest pain.   olmesartan  (BENICAR ) 40 MG tablet Take 40 mg by mouth in the morning.   Omega-3 Fatty Acids (FISH OIL) 1000 MG CAPS Take 1,000 mg by mouth 2 (two) times daily.   Psyllium (METAMUCIL) 28.3 % POWD Take 6 g by mouth in the morning. 8 oz of Water  (2 teaspoons)   SPS, SODIUM POLYSTYRENE SULF, 15 GM/60ML suspension SMARTSIG:60 Milliliter(s) By Mouth Twice Daily   traMADol  (ULTRAM ) 50 MG tablet Take 1-2 tablets (50-100 mg total) by mouth every 6 (six) hours as needed for moderate pain or severe pain.   vitamin B-12 (CYANOCOBALAMIN ) 500 MCG tablet Take 500 mcg by mouth every other day.   "

## 2024-03-30 ENCOUNTER — Other Ambulatory Visit: Payer: Self-pay

## 2024-03-30 ENCOUNTER — Encounter: Payer: Self-pay | Admitting: Cardiology

## 2024-03-30 ENCOUNTER — Ambulatory Visit: Attending: Cardiology | Admitting: Cardiology

## 2024-03-30 VITALS — BP 130/84 | HR 82 | Ht 66.0 in | Wt 193.6 lb

## 2024-03-30 DIAGNOSIS — R001 Bradycardia, unspecified: Secondary | ICD-10-CM

## 2024-03-30 DIAGNOSIS — Z952 Presence of prosthetic heart valve: Secondary | ICD-10-CM | POA: Diagnosis not present

## 2024-03-30 DIAGNOSIS — I5032 Chronic diastolic (congestive) heart failure: Secondary | ICD-10-CM

## 2024-03-30 DIAGNOSIS — E875 Hyperkalemia: Secondary | ICD-10-CM | POA: Diagnosis not present

## 2024-03-30 DIAGNOSIS — N183 Chronic kidney disease, stage 3 unspecified: Secondary | ICD-10-CM

## 2024-03-30 DIAGNOSIS — I110 Hypertensive heart disease with heart failure: Secondary | ICD-10-CM

## 2024-03-30 DIAGNOSIS — E7800 Pure hypercholesterolemia, unspecified: Secondary | ICD-10-CM

## 2024-03-30 DIAGNOSIS — Z789 Other specified health status: Secondary | ICD-10-CM | POA: Diagnosis not present

## 2024-03-30 DIAGNOSIS — I6523 Occlusion and stenosis of bilateral carotid arteries: Secondary | ICD-10-CM

## 2024-03-30 NOTE — Patient Instructions (Signed)
 Medication Instructions:  Your physician recommends that you continue on your current medications as directed. Please refer to the Current Medication list given to you today.  *If you need a refill on your cardiac medications before your next appointment, please call your pharmacy*  Lab Work: None If you have labs (blood work) drawn today and your tests are completely normal, you will receive your results only by: MyChart Message (if you have MyChart) OR A paper copy in the mail If you have any lab test that is abnormal or we need to change your treatment, we will call you to review the results.  Testing/Procedures: Your physician has requested that you have an echocardiogram. Echocardiography is a painless test that uses sound waves to create images of your heart. It provides your doctor with information about the size and shape of your heart and how well your heart's chambers and valves are working. This procedure takes approximately one hour. There are no restrictions for this procedure. Please do NOT wear cologne, perfume, aftershave, or lotions (deodorant is allowed). Please arrive 15 minutes prior to your appointment time.  Please note: We ask at that you not bring children with you during ultrasound (echo/ vascular) testing. Due to room size and safety concerns, children are not allowed in the ultrasound rooms during exams. Our front office staff cannot provide observation of children in our lobby area while testing is being conducted. An adult accompanying a patient to their appointment will only be allowed in the ultrasound room at the discretion of the ultrasound technician under special circumstances. We apologize for any inconvenience.   Follow-Up: At [facility], you and your health needs are our priority.  As part of our continuing mission to provide you with exceptional heart care, our providers are all part of one team.  This team includes your primary Cardiologist  (physician) and Advanced Practice Providers or APPs (Physician Assistants and Nurse Practitioners) who all work together to provide you with the care you need, when you need it.  Your next appointment:   9 month(s)  Provider:   Redell Leiter, MD    We recommend signing up for the patient portal called MyChart.  Sign up information is provided on this After Visit Summary.  MyChart is used to connect with patients for Virtual Visits (Telemedicine).  Patients are able to view lab/test results, encounter notes, upcoming appointments, etc.  Non-urgent messages can be sent to your provider as well.   To learn more about what you can do with MyChart, go to [email or web address].   Other Instructions None

## 2024-04-23 ENCOUNTER — Ambulatory Visit
